# Patient Record
Sex: Female | Born: 1960 | Race: White | Hispanic: No | Smoking: Former smoker
Health system: Southern US, Community
[De-identification: ages and names within clinical notes are randomized; demographics above are authoritative.]

## PROBLEM LIST (undated history)

## (undated) DIAGNOSIS — I2510 Atherosclerotic heart disease of native coronary artery without angina pectoris: Secondary | ICD-10-CM

## (undated) DIAGNOSIS — I219 Acute myocardial infarction, unspecified: Secondary | ICD-10-CM

## (undated) DIAGNOSIS — I200 Unstable angina: Secondary | ICD-10-CM

## (undated) DIAGNOSIS — R0602 Shortness of breath: Secondary | ICD-10-CM

## (undated) DIAGNOSIS — J189 Pneumonia, unspecified organism: Secondary | ICD-10-CM

## (undated) DIAGNOSIS — E8881 Metabolic syndrome: Secondary | ICD-10-CM

## (undated) DIAGNOSIS — I10 Essential (primary) hypertension: Secondary | ICD-10-CM

## (undated) DIAGNOSIS — Z6841 Body Mass Index (BMI) 40.0 and over, adult: Secondary | ICD-10-CM

## (undated) DIAGNOSIS — E785 Hyperlipidemia, unspecified: Secondary | ICD-10-CM

## (undated) HISTORY — PX: ANKLE SURGERY: SHX546

## (undated) HISTORY — PX: CORONARY ANGIOPLASTY: SHX604

## (undated) HISTORY — PX: LEG SURGERY: SHX1003

## (undated) HISTORY — PX: ABDOMINAL HYSTERECTOMY: SHX81

---

## 2002-04-01 ENCOUNTER — Encounter: Payer: Self-pay | Admitting: Family Medicine

## 2002-04-01 ENCOUNTER — Encounter: Admission: RE | Admit: 2002-04-01 | Discharge: 2002-04-01 | Payer: Self-pay | Admitting: Family Medicine

## 2003-02-02 ENCOUNTER — Inpatient Hospital Stay (HOSPITAL_COMMUNITY): Admission: EM | Admit: 2003-02-02 | Discharge: 2003-02-04 | Payer: Self-pay | Admitting: Emergency Medicine

## 2003-02-02 ENCOUNTER — Encounter: Payer: Self-pay | Admitting: Emergency Medicine

## 2004-02-10 ENCOUNTER — Other Ambulatory Visit: Admission: RE | Admit: 2004-02-10 | Discharge: 2004-02-10 | Payer: Self-pay | Admitting: *Deleted

## 2004-02-26 ENCOUNTER — Ambulatory Visit (HOSPITAL_COMMUNITY): Admission: RE | Admit: 2004-02-26 | Discharge: 2004-02-26 | Payer: Self-pay | Admitting: *Deleted

## 2004-05-10 ENCOUNTER — Inpatient Hospital Stay (HOSPITAL_COMMUNITY): Admission: RE | Admit: 2004-05-10 | Discharge: 2004-05-12 | Payer: Self-pay | Admitting: *Deleted

## 2007-02-23 IMAGING — CR DG CHEST 1V PORT
1 series · 1 of 1 positions shown · non-contrast
Comparison: None.

CLINICAL DATA: Chest pain. Shortness of breath. Left arm numbness.
 PORTABLE CHEST - 1 VIEW ([IQ] hours):

[view not recorded]
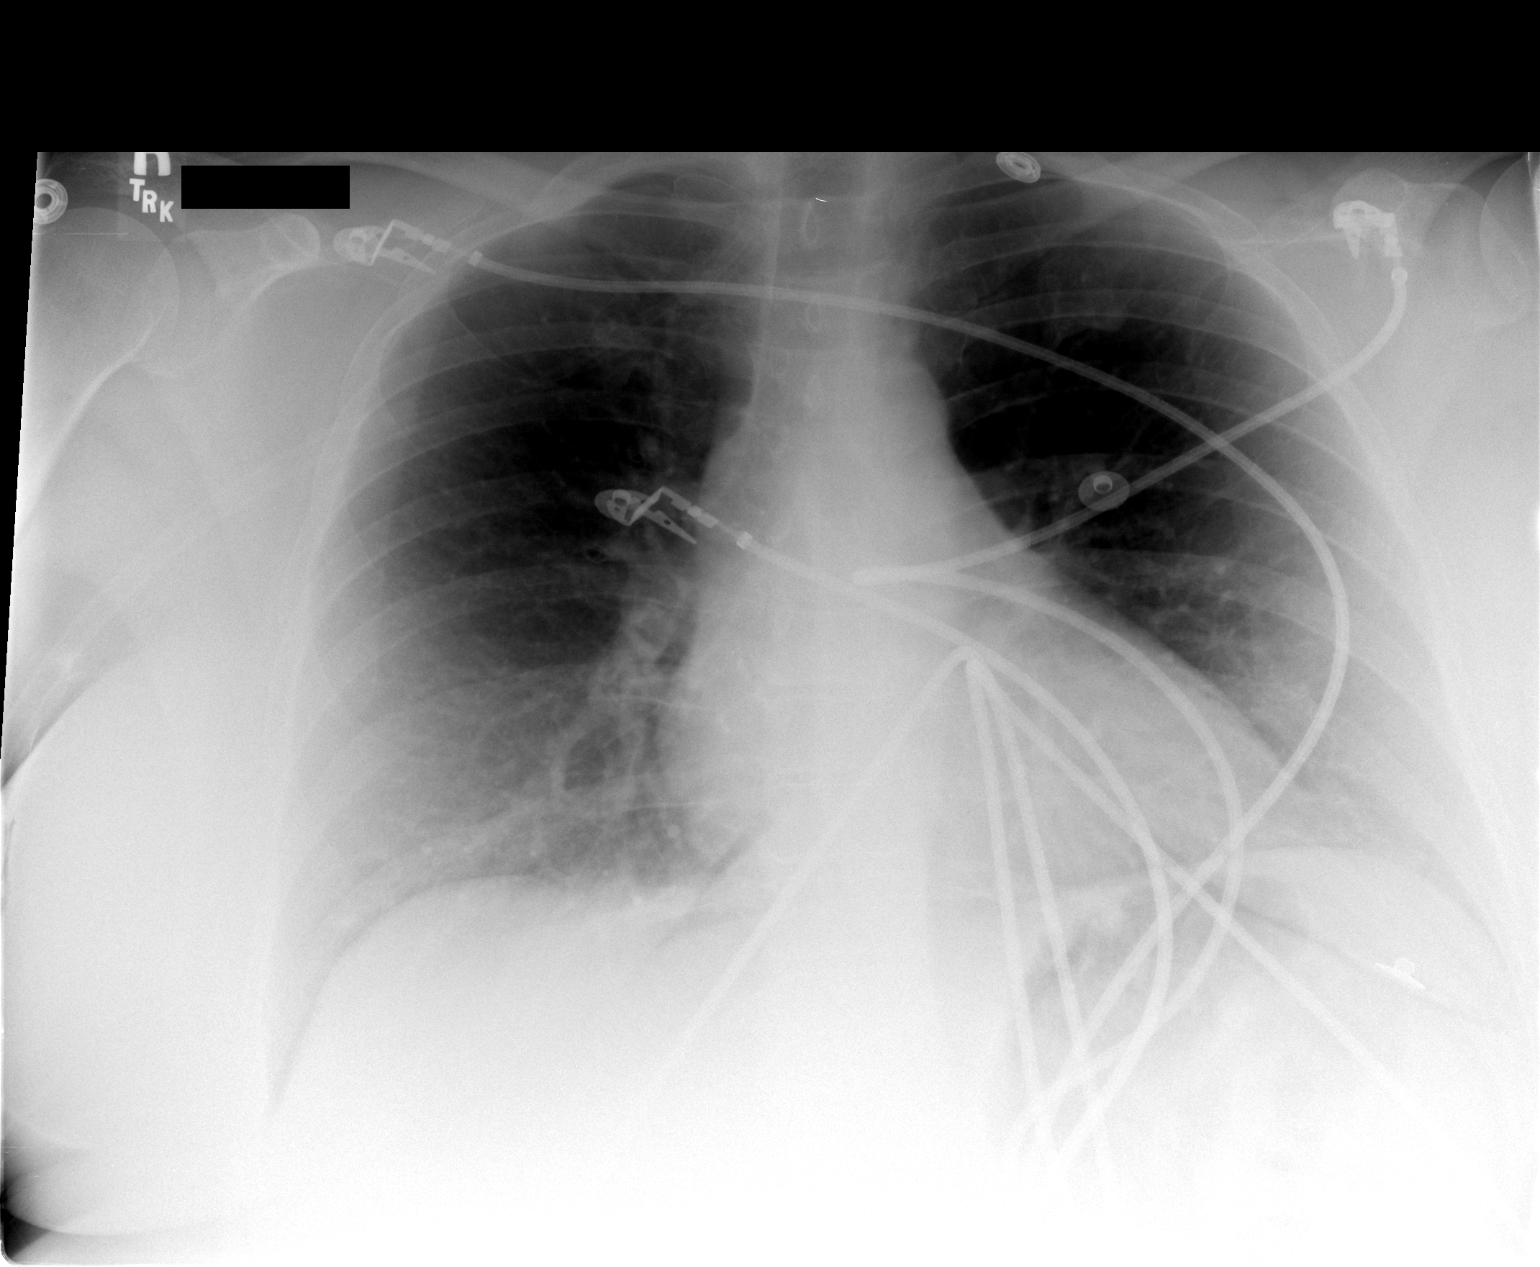

[1 of 1 positions shown; findings below may reference images not displayed]

FINDINGS: The lungs are clear. The heart is within upper limits of normal. No bony abnormality is seen.
IMPRESSION: No active lung disease.

## 2007-03-06 ENCOUNTER — Observation Stay (HOSPITAL_COMMUNITY): Admission: EM | Admit: 2007-03-06 | Discharge: 2007-03-07 | Payer: Self-pay | Admitting: Emergency Medicine

## 2007-04-18 ENCOUNTER — Ambulatory Visit: Payer: Self-pay | Admitting: Orthopedic Surgery

## 2007-04-18 DIAGNOSIS — T84498A Other mechanical complication of other internal orthopedic devices, implants and grafts, initial encounter: Secondary | ICD-10-CM | POA: Insufficient documentation

## 2007-04-26 ENCOUNTER — Telehealth: Payer: Self-pay | Admitting: Orthopedic Surgery

## 2010-07-04 ENCOUNTER — Inpatient Hospital Stay (HOSPITAL_COMMUNITY)
Admission: EM | Admit: 2010-07-04 | Discharge: 2010-07-07 | Disposition: A | Discharge status: Other Acute Inpatient Hospital | Payer: Self-pay | Source: Home / Self Care | Attending: Family Medicine | Admitting: Family Medicine

## 2010-07-04 IMAGING — CR DG CHEST 1V PORT
1 series · 1 of 1 positions shown · non-contrast
Comparison: [DATE]

CLINICAL DATA: Chest pain.  Coronary artery disease.

PORTABLE CHEST - 1 VIEW

[view not recorded]
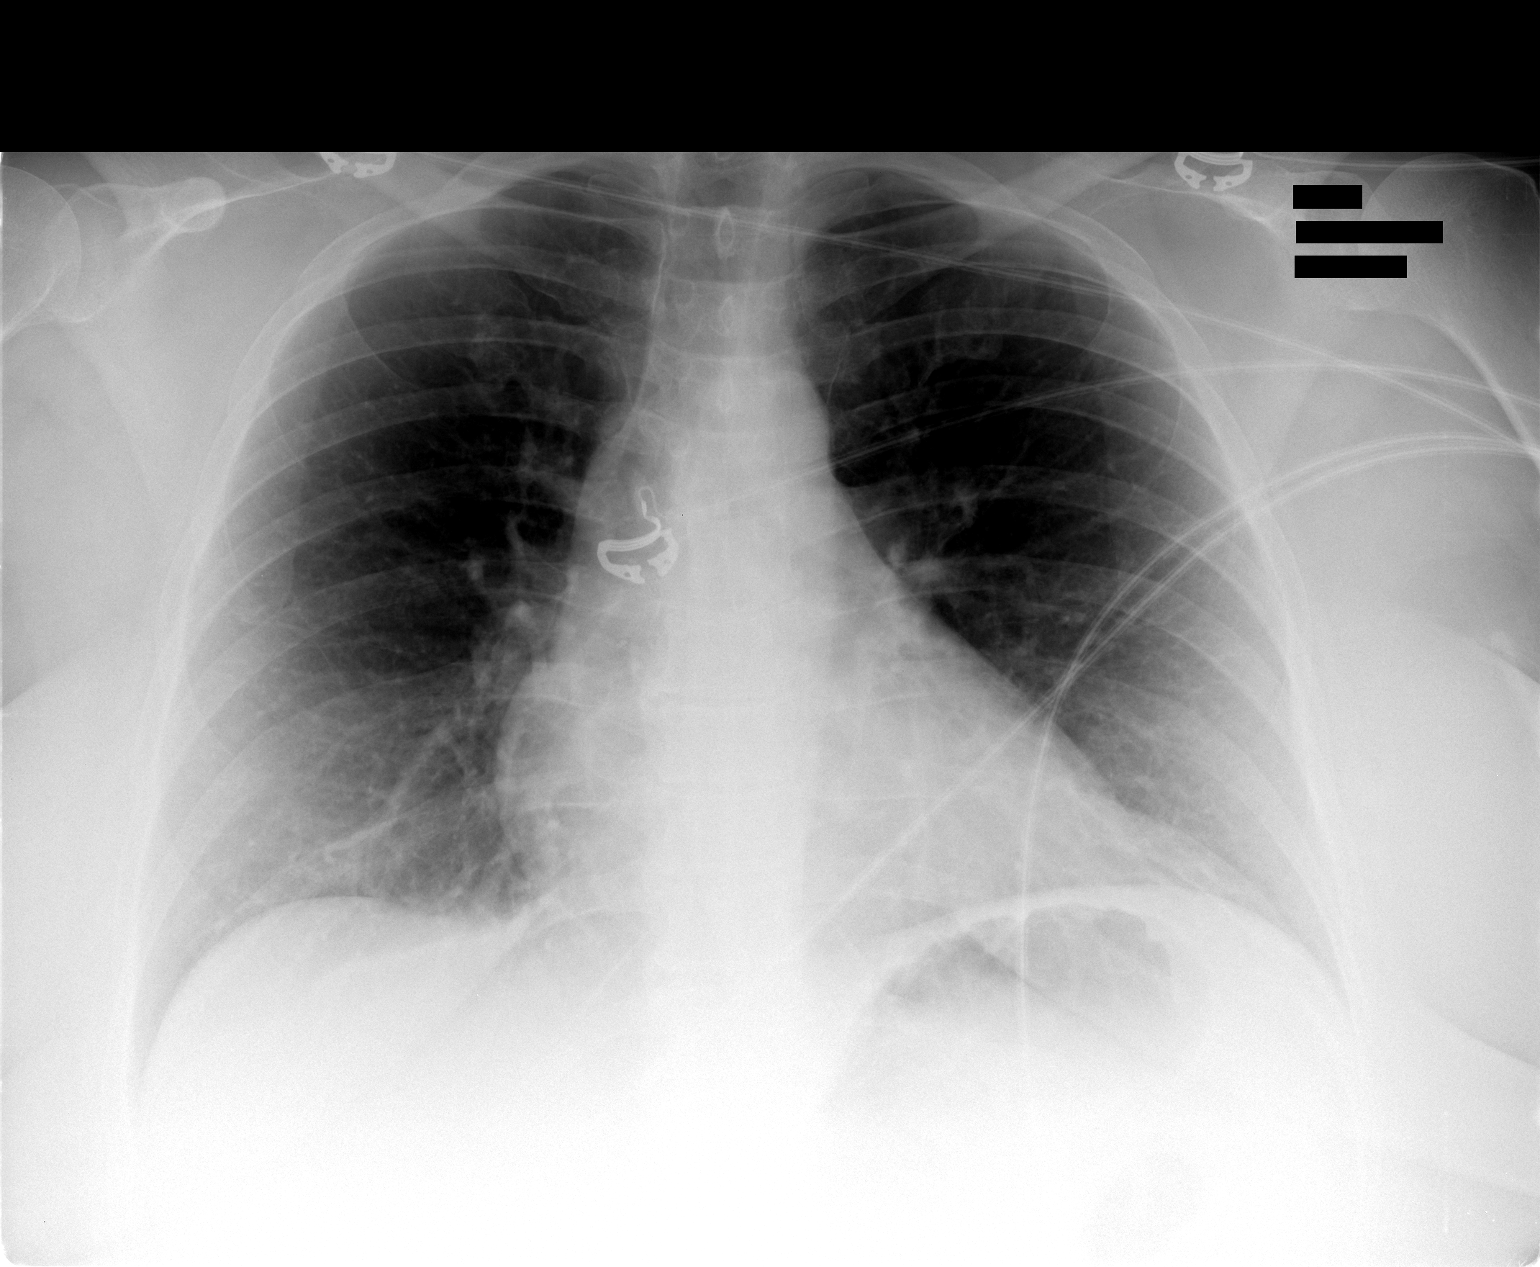

[1 of 1 positions shown; findings below may reference images not displayed]

FINDINGS: Artifact overlies chest.  Heart size is normal.
Mediastinal shadows are normal.  Lungs are clear except for mild
scarring at the bases.  Vascularity is normal.  No effusions.  No
significant bony finding.
IMPRESSION: No active disease.  Basilar scarring

## 2010-07-05 ENCOUNTER — Inpatient Hospital Stay (HOSPITAL_COMMUNITY)
Admission: AD | Admit: 2010-07-05 | Discharge: 2010-07-07 | Payer: Self-pay | Attending: Cardiology | Admitting: Cardiology

## 2010-07-05 LAB — DIFFERENTIAL
Basophils Absolute: 0 10*3/uL (ref 0.0–0.1)
Basophils Relative: 0 % (ref 0–1)
Eosinophils Absolute: 0.1 10*3/uL (ref 0.0–0.7)
Eosinophils Relative: 2 % (ref 0–5)
Lymphocytes Relative: 39 % (ref 12–46)
Lymphs Abs: 3 10*3/uL (ref 0.7–4.0)
Monocytes Absolute: 0.6 10*3/uL (ref 0.1–1.0)
Monocytes Relative: 8 % (ref 3–12)
Neutro Abs: 4 10*3/uL (ref 1.7–7.7)
Neutrophils Relative %: 51 % (ref 43–77)

## 2010-07-05 LAB — CBC
HCT: 39.6 % (ref 36.0–46.0)
Hemoglobin: 13.7 g/dL (ref 12.0–15.0)
MCH: 27.9 pg (ref 26.0–34.0)
MCHC: 34.6 g/dL (ref 30.0–36.0)
MCV: 80.7 fL (ref 78.0–100.0)
Platelets: 223 10*3/uL (ref 150–400)
RBC: 4.91 MIL/uL (ref 3.87–5.11)
RDW: 13.1 % (ref 11.5–15.5)
WBC: 7.8 10*3/uL (ref 4.0–10.5)

## 2010-07-05 LAB — POCT CARDIAC MARKERS
CKMB, poc: 1.2 ng/mL (ref 1.0–8.0)
Myoglobin, poc: 132 ng/mL (ref 12–200)
Troponin i, poc: <0.05 ng/mL (ref 0.00–0.09)

## 2010-07-05 LAB — COMPREHENSIVE METABOLIC PANEL
ALT: 21 U/L (ref 0–35)
AST: 23 U/L (ref 0–37)
Albumin: 3.4 g/dL — ABNORMAL LOW (ref 3.5–5.2)
Calcium: 9.3 mg/dL (ref 8.4–10.5)
Creatinine, Ser: 0.78 mg/dL (ref 0.4–1.2)
GFR calc Af Amer: >60 mL/min (ref >60)
GFR calc non Af Amer: >60 mL/min (ref >60)
Sodium: 139 mEq/L (ref 135–145)
Total Protein: 6.8 g/dL (ref 6.0–8.3)

## 2010-07-06 LAB — BASIC METABOLIC PANEL
BUN: 6 mg/dL (ref 6–23)
CO2: 23 mEq/L (ref 19–32)
Calcium: 9.1 mg/dL (ref 8.4–10.5)
Chloride: 105 mEq/L (ref 96–112)
Creatinine, Ser: 0.76 mg/dL (ref 0.4–1.2)
GFR calc non Af Amer: >60 mL/min (ref >60)
Potassium: 4.7 mEq/L (ref 3.5–5.1)
Sodium: 143 mEq/L (ref 135–145)

## 2010-07-06 LAB — LIPID PANEL
Cholesterol: 186 mg/dL (ref 0–200)
Total CHOL/HDL Ratio: 6.6 RATIO
Triglycerides: 159 mg/dL — ABNORMAL HIGH (ref <150)
VLDL: 32 mg/dL (ref 0–40)
VLDL: 31 mg/dL (ref 0–40)

## 2010-07-06 LAB — CARDIAC PANEL(CRET KIN+CKTOT+MB+TROPI)
CK, MB: 3.7 ng/mL (ref 0.3–4.0)
CK, MB: 2.9 ng/mL (ref 0.3–4.0)
CK, MB: 1.5 ng/mL (ref 0.3–4.0)
Relative Index: INVALID (ref 0.0–2.5)
Relative Index: INVALID (ref 0.0–2.5)
Total CK: 77 U/L (ref 7–177)
Total CK: 79 U/L (ref 7–177)
Troponin I: 0.13 ng/mL — ABNORMAL HIGH (ref 0.00–0.06)
Troponin I: 0.07 ng/mL — ABNORMAL HIGH (ref 0.00–0.06)

## 2010-07-06 LAB — CBC
HCT: 38.1 % (ref 36.0–46.0)
MCHC: 32.3 g/dL (ref 30.0–36.0)
Platelets: 250 10*3/uL (ref 150–400)
RDW: 13.6 % (ref 11.5–15.5)

## 2010-07-06 LAB — PROTIME-INR: INR: 1.04 (ref 0.00–1.49)

## 2010-07-07 LAB — CBC
HCT: 37 % (ref 36.0–46.0)
Hemoglobin: 12.3 g/dL (ref 12.0–15.0)
RBC: 4.58 MIL/uL (ref 3.87–5.11)
WBC: 7.4 10*3/uL (ref 4.0–10.5)

## 2010-07-07 LAB — BASIC METABOLIC PANEL
CO2: 27 mEq/L (ref 19–32)
Chloride: 109 mEq/L (ref 96–112)
GFR calc non Af Amer: >60 mL/min (ref >60)
Glucose, Bld: 94 mg/dL (ref 70–99)
Potassium: 4 mEq/L (ref 3.5–5.1)
Sodium: 141 mEq/L (ref 135–145)

## 2010-07-08 NOTE — H&P (Signed)
[name], Carson               ACCOUNT NO.:  [id number]  MEDICAL RECORD NO.:  [id number]          PATIENT TYPE:  INP  LOCATION:  A210                          FACILITY:  [facility]  PHYSICIAN:  Mila Homer. Sudie Bailey, M.D.DATE OF BIRTH:  [birth year]  DATE OF ADMISSION:  07/04/2010 DATE OF DISCHARGE:  LH                             HISTORY & PHYSICAL   This 50 year old woman was at home last evening when she developed severe chest heaviness and pressure with tremendous weakness of both arms.  She could not find nitroglycerin at home, and was on her way to a friend's to get some nitroglycerin when the pain and symptoms got worse so she stopped at a local fire station where EMS met her, gave her nitroglycerin.  By the time she had an  IV placed was on her way to [facility] emergency room.  The pain gradually let up, so that by the time she reached emergency room the pain had cleared.  The patient has a long and complicated history.  She had unstable angina in 2004, and diagnostic catheterization in August of that year showed 80% mid LAD narrowing.  She had a cutting balloon intervention with good results.  In September 2008 she developed chest pain while at work, and was admitted to [facility] for another cardiac cath.  At that time she had a normal left anterior descending artery, normal left main, and a small left circumflex, and a very large obtuse marginal artery which had a 50% blockage.  She was put on Plavix and aspirin at that time.  She continues on aspirin 81 mg daily.  She has, otherwise, done well.  She is married with 2 children.  She works for an [employer] in [place].  About 3 years ago her 72 year old brother died suddenly of an MI.  There are a number of other family members who had coronary disease.  On admission through the emergency room she had a temperature of 98.3, blood pressure 130/73, pulse 68, respiratory rate 18. My exam roughly 12  hours after initial episode she had stayed pain free, and was in no acute distress.  Speech was normal, sentence structure intact. Mucous membranes are moist. SKIN:  Turgor normal. HEART:  Had an absolute regular rhythm, rate of about 70. LUNGS:  Were clear throughout, moving air well. ABDOMEN:  Soft without organomegaly or mass or tenderness.  She had no edema of the ankles. There is no axillary or supraclavicular adenopathy.  Review of her blood test showed an admission, normal CBC and CMP except for potassium 3.4 and a glucose of 120.  Her albumin is 3.4.  Cardiac markers on admission showed a CK-MB of 1.2, troponin less than 0.05, myoglobin 132, but then about 7 hours later her troponin was up to 0.29. CK-MB at that time was 3.7 with a CK of 77.  OBJECTIVE:  Her EKG was essentially normal.  There were 1 mm ST elevations noted in a number of leads.  ADMISSION DIAGNOSES: 1. Acute coronary syndrome. 2. Family history of coronary artery disease. 3. Obesity. 4. Personal history of CAD, status post cutting  balloon angioplasty of an LAD lesion  Given the elevation of troponin, I have asked her cardiologist to see her in consult.  Discussed all this with patient and her husband at length this morning.     Mila Homer. Sudie Bailey, M.D.     SDK/MEDQ  D:  07/05/2010  T:  07/05/2010  Job:  [id number]  Electronically Signed by John Giovanni M.D. on 07/06/2010 05:20:02 AM

## 2010-07-15 NOTE — Discharge Summary (Addendum)
[name], [name]               ACCOUNT NO.:  [id number]  MEDICAL RECORD NO.:  [id number]          PATIENT TYPE:  INP  LOCATION:  6529                         FACILITY:  [facility]  PHYSICIAN:  Ritta Slot, MD     DATE OF BIRTH:  14-Oct-1960  DATE OF ADMISSION:  07/05/2010 DATE OF DISCHARGE:  07/07/2010                              DISCHARGE SUMMARY   DISCHARGE DIAGNOSES: 1. Acute coronary syndrome/unstable angina with peak troponin 0.29. 2. Known coronary disease with previous left anterior descending     atherectomy in 2004 and now 90% circumflex as culprit lesion,     undergoing percutaneous transluminal coronary angioplasty and stent     with a drug-eluting Promus stent to the circumflex. 3. Normal left ventricular function with ejection fraction 65%. 4. Family history of coronary disease. 5. Hyperkalemia, resolved after Kayexalate. 6. Dyslipidemia, treated now.  DISCHARGE CONDITION:  Improved.  PROCEDURES:  Combined left heart cath with radial cath by Dr. Ritta Slot on July 06, 2010, and percutaneous transluminal coronary angioplasty and stent deployment by Dr. Nanetta Batty to the left circumflex with a Promus drug-eluting stent again through the radial artery.  DISCHARGE INSTRUCTIONS: 1. May return to work on July 11, 2010. 2. Increase activity slowly.  May shower.  No lifting for 3 days.  No     driving for 3 days. 3. Low-sodium heart-healthy diet. 4. Wash cath site with soap and water.  Follow radial cath     instructions. 5. Follow up with Dr. Lynnea Ferrier on July 18, 2010, at 4:15 p.m.  DISCHARGE MEDICATIONS:  See medication reconciliation sheet.  She has been placed on new medications including statins, beta-blocker, Effient, and aspirin.  HOSPITAL COURSE:  [name] was seen by Dr. Lynnea Ferrier in consult at [facility].  Dr. Lynnea Ferrier saw her and felt she needed cardiac catheterization.  She was transferred to [facility] due to her  known coronary disease with atherectomy of the mid LAD lesion in August 2004 and a repeat cath in September 2008 with normal LAD but a 50% mid OM lesion of the circumflex.  EF was 60%, underwent cardiac cath with the radial artery and tolerated the procedure well and was found to have 90% circumflex stenosis and stent was placed.  On July 07, 2010, she is stable, ambulates without problems, and ready for discharge home.  LABORATORY DATA:  At discharge, sodium 141, potassium 4, chloride 109, CO2 is 27, glucose 94, BUN 5, creatinine 0.83, calcium 9.0.  Hemoglobin 12.3, hematocrit 37, WBC 7.4, platelets 200.  Cardiac enzymes; CK-MBs were all negative at 75 with MB of 1.5, troponin I peak was 0.29. Total cholesterol was 187, triglycerides 159, HDL 30, and LDL 125. LFTs; total bili 0.3, alkaline phos 80, SGOT 23, SGPT 21, total protein 6.8, calcium 9.3.  RADIOLOGY:  Chest x-ray, no active disease, basilar scarring. Echo, no echoes were done. EKGs postprocedure, sinus rhythm, normal EKG, and pre procedure on July 05, 2010, sinus rhythm, no significant changes.  Again, she will follow up as an outpatient.     Darcella Gasman. Annie Paras, N.P.   ______________________________ Ritta Slot, MD  LRI/MEDQ  D:  07/07/2010  T:  07/08/2010  Job:  [id number]  cc:   Mila Homer. Sudie Bailey, M.D.  Electronically Signed by Nada Boozer N.P. on 07/08/2010 07:47:00 PM Electronically Signed by Ritta Slot MD on 07/15/2010 04:45:41 PM

## 2010-07-15 NOTE — Procedures (Addendum)
  NAMEKEORA, [name]               ACCOUNT NO.:  [id number]  MEDICAL RECORD NO.:  [id number]           PATIENT TYPE:  LOCATION:                                 FACILITY:  PHYSICIAN:  Ritta Slot, MD     DATE OF BIRTH:  [birth year]  DATE OF PROCEDURE:  07/06/2010 DATE OF DISCHARGE:                           CARDIAC CATHETERIZATION   PROCEDURE:  Left heart catheterization.  INDICATIONS:  Ms. Chanique Duca is a very pleasant 50 year old Caucasian female who has known coronary artery disease with a 50% mid-circ lesion from the cath in 2008 done by myself.  She now comes in with an STEMI with a peak troponin of 0.29 yesterday, so she was transferred from [facility] down to [facility] to have a cardiac catheterization.  After informed consent, her right wrist was prepped and draped in the usual sterile fashion.  She received 10 mg of Versed, 50 mcg of fentanyl for IV sedation.  Using the modified Seldinger technique, a 5-French sheath was placed in the right radial artery without difficulty and using the regular cath lab cocktail.  A 5-French Tiger catheter was then used to engage the left main coronary artery. Visualization of the left circulation system was obtained in AP cranial, AP caudal, LAO caudal, and RAO cranial views.  The Tiger catheter was then used to engage the right coronary artery without difficulty. Visualization was obtained in RAO cranial and LAO cranial views.  The Tiger catheter was then changed over an exchange wire for a straight pigtail.  The straight pigtail was placed into the LV without difficulty.  Visualization of the LV cavity was obtained in the RAO oblique views, and 30 mL of contrast with 12 mL per second.  FINDINGS:  AO pressure 109/71, mean of 85; LV pressure 119/3;  LVEDP of 14.  LV pullback 130/4, LVEDP of 19.  AO pullback 107/65, mean of 84. The right coronary artery had a small 30% mid vessel stenosis, but no significant  disease.  It was dominant giving rise to PL and PDA.  The left main bifurcated in the LAD and the circumflex territory.  The LAD territory was free of any disease.  The circumflex had a very rather long diffuse 80% narrowing in its proximal portion prior to the takeoff of the OM-1.  There was also a high left atrial branch that was arising from the circumflex artery.  It was found to be normal.  No significant wall motion abnormality.  No mitral regurgitation.  There is no gradient across the aortic valve.  FINAL CONCLUSION:  A 90% stenosis in the proximal circumflex artery. The patient will require PCI to be performed at that time by Dr. Allyson Sabal.     Ritta Slot, MD     HS/MEDQ  D:  07/06/2010  T:  07/07/2010  Job:  [id number]  Electronically Signed by Ritta Slot MD on 07/15/2010 04:45:43 PM

## 2010-09-29 ENCOUNTER — Emergency Department (HOSPITAL_COMMUNITY)
Admission: EM | Admit: 2010-09-29 | Discharge: 2010-09-29 | Disposition: A | Discharge status: Home / Self Care | Payer: BC Managed Care – PPO | Attending: Emergency Medicine | Admitting: Emergency Medicine

## 2010-09-29 ENCOUNTER — Emergency Department (HOSPITAL_COMMUNITY): Payer: BC Managed Care – PPO

## 2010-09-29 DIAGNOSIS — Z7902 Long term (current) use of antithrombotics/antiplatelets: Secondary | ICD-10-CM | POA: Insufficient documentation

## 2010-09-29 DIAGNOSIS — R109 Unspecified abdominal pain: Secondary | ICD-10-CM | POA: Insufficient documentation

## 2010-09-29 DIAGNOSIS — I2510 Atherosclerotic heart disease of native coronary artery without angina pectoris: Secondary | ICD-10-CM | POA: Insufficient documentation

## 2010-09-29 DIAGNOSIS — Z79899 Other long term (current) drug therapy: Secondary | ICD-10-CM | POA: Insufficient documentation

## 2010-09-29 DIAGNOSIS — Z7982 Long term (current) use of aspirin: Secondary | ICD-10-CM | POA: Insufficient documentation

## 2010-09-29 LAB — COMPREHENSIVE METABOLIC PANEL
ALT: 20 U/L (ref 0–35)
Alkaline Phosphatase: 88 U/L (ref 39–117)
CO2: 24 mEq/L (ref 19–32)
Calcium: 9.6 mg/dL (ref 8.4–10.5)
GFR calc non Af Amer: >60 mL/min (ref >60)
Glucose, Bld: 96 mg/dL (ref 70–99)
Sodium: 138 mEq/L (ref 135–145)
Total Bilirubin: 0.4 mg/dL (ref 0.3–1.2)

## 2010-09-29 LAB — URINALYSIS, ROUTINE W REFLEX MICROSCOPIC
Bilirubin Urine: NEGATIVE
Ketones, ur: NEGATIVE mg/dL
Nitrite: NEGATIVE
Protein, ur: NEGATIVE mg/dL
Specific Gravity, Urine: 1.01 (ref 1.005–1.030)
Urobilinogen, UA: 0.2 mg/dL (ref 0.0–1.0)

## 2010-09-29 LAB — DIFFERENTIAL
Basophils Absolute: 0 10*3/uL (ref 0.0–0.1)
Lymphocytes Relative: 33 % (ref 12–46)
Monocytes Absolute: 1 10*3/uL (ref 0.1–1.0)
Monocytes Relative: 10 % (ref 3–12)
Neutro Abs: 5.4 10*3/uL (ref 1.7–7.7)

## 2010-09-29 LAB — CBC
HCT: 40.8 % (ref 36.0–46.0)
Hemoglobin: 13.6 g/dL (ref 12.0–15.0)
MCHC: 33.3 g/dL (ref 30.0–36.0)

## 2010-09-29 LAB — LIPASE, BLOOD: Lipase: 30 U/L (ref 11–59)

## 2010-09-29 IMAGING — US US ABDOMEN COMPLETE
1 series · 14 of 25 positions shown · non-contrast
Comparison: None

CLINICAL DATA: Abdominal pain

ULTRASOUND ABDOMEN:
TECHNIQUE: Sonography of upper abdominal structures was performed.

[Series 1: us abdomen complete · 0.25mm/px · 14 of 85 slices shown]
[im 1/85]
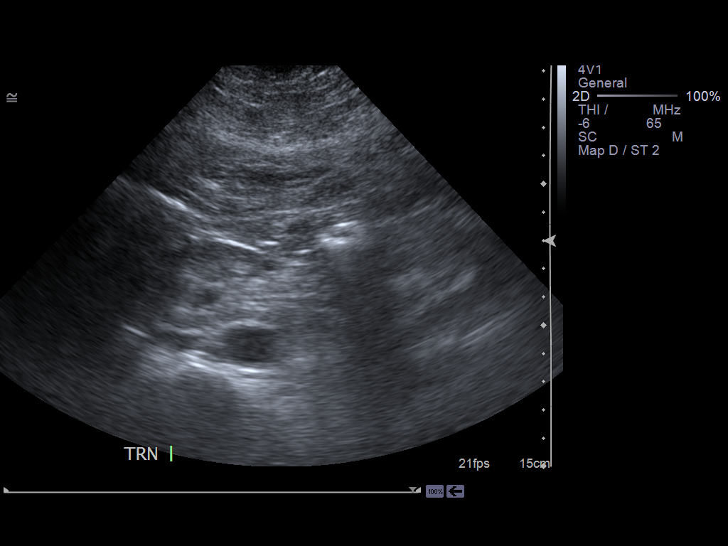
[im 8/85]
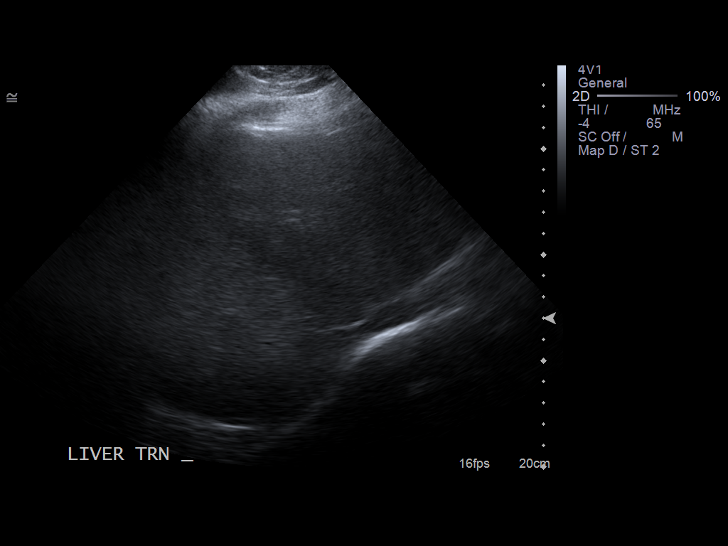
[im 15/85]
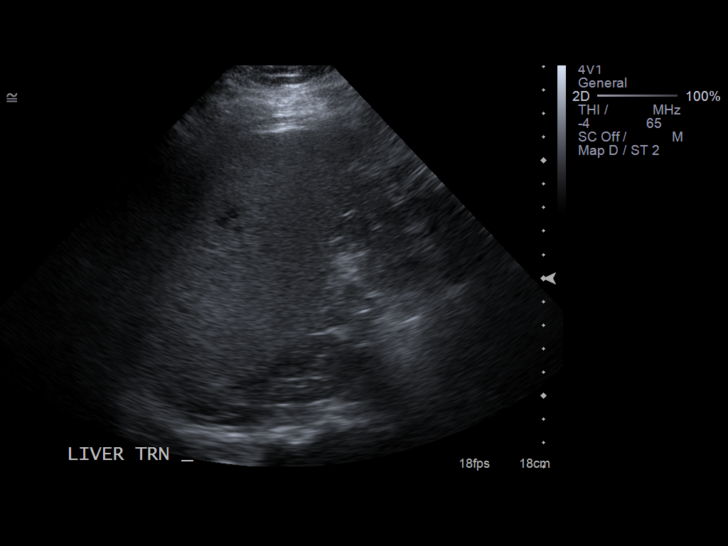
[im 22/85]
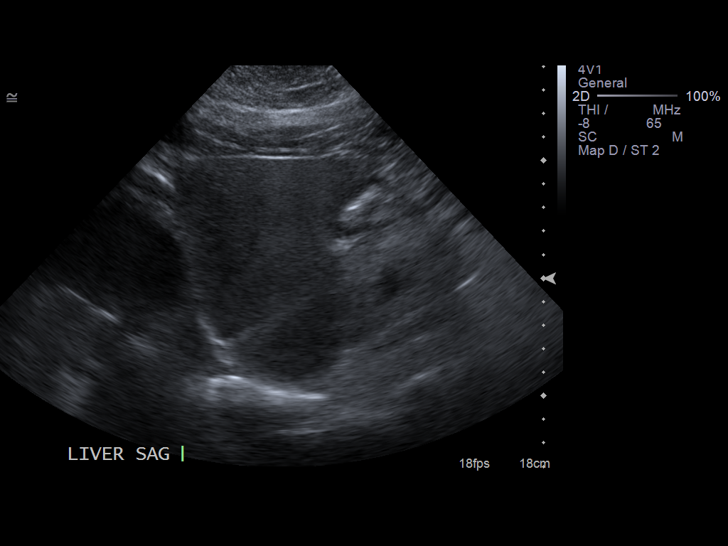
[im 29/85]
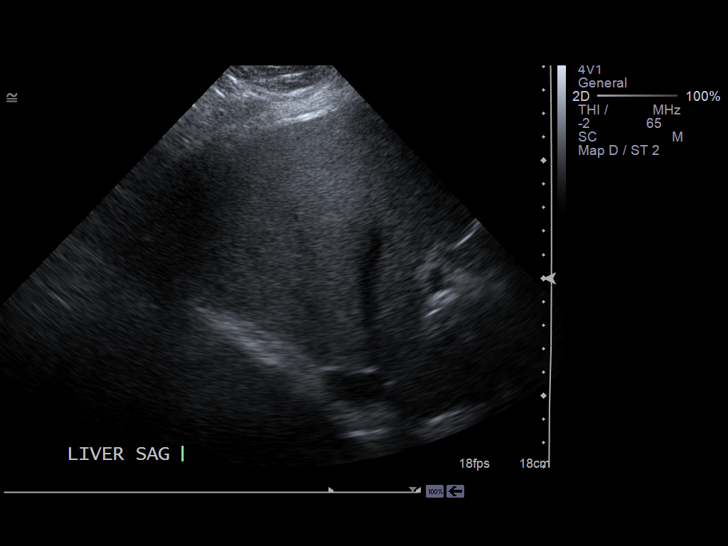
[im 32/85]
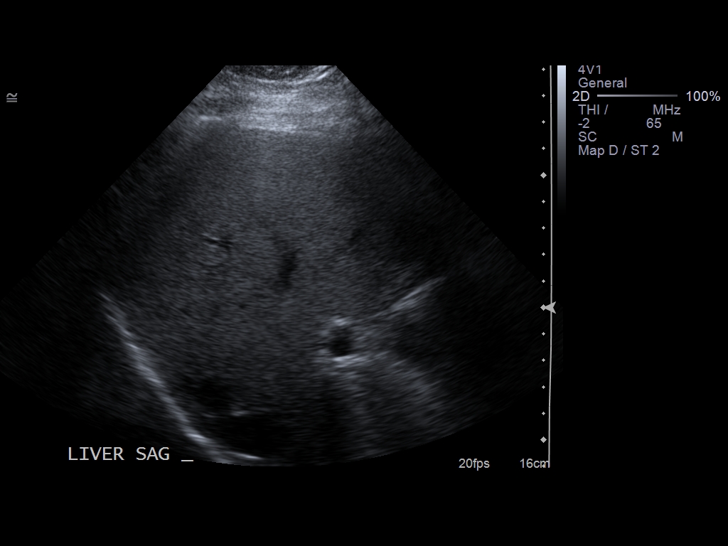
[im 39/85]
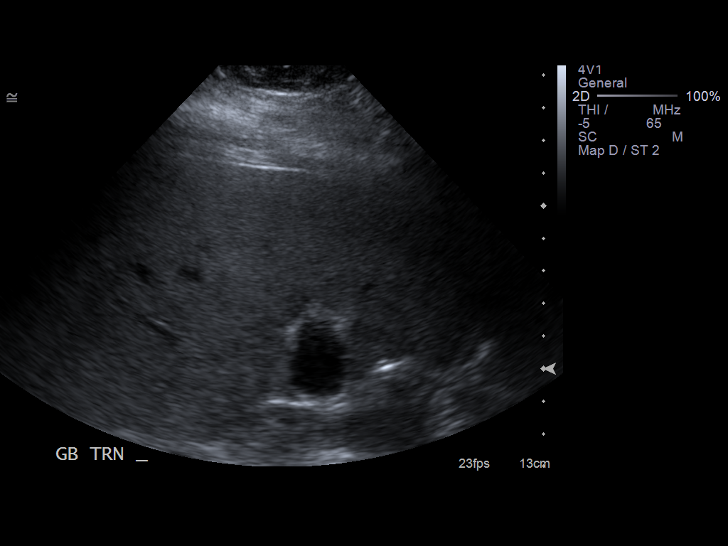
[im 46/85]
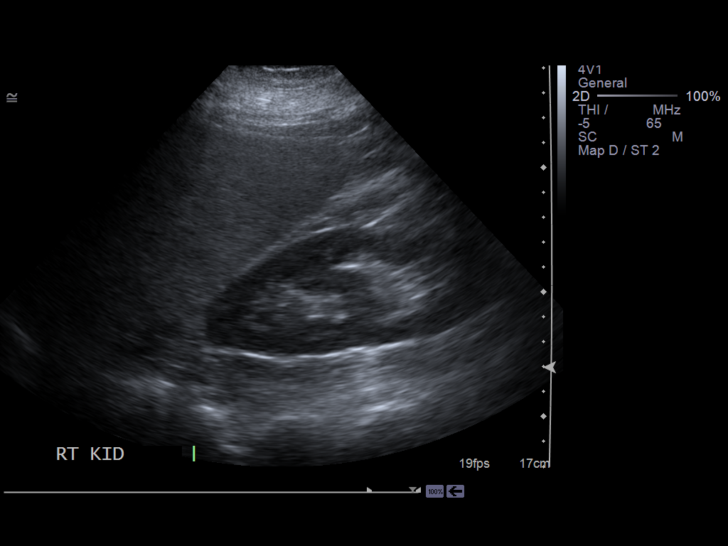
[im 53/85]
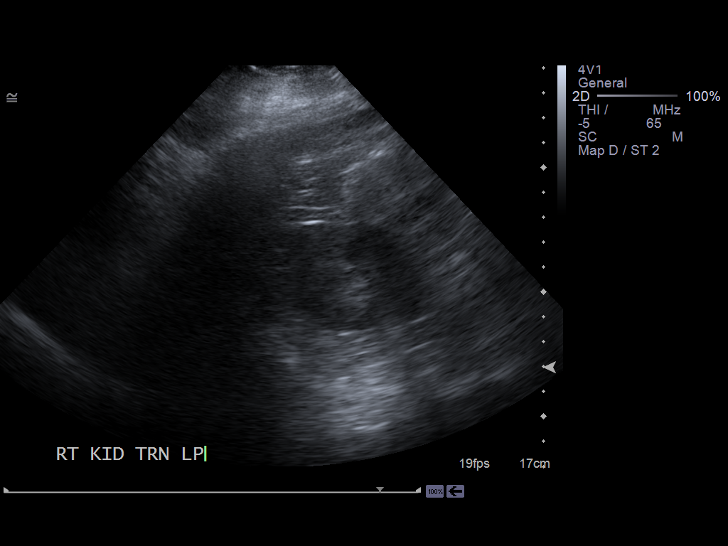
[im 57/85]
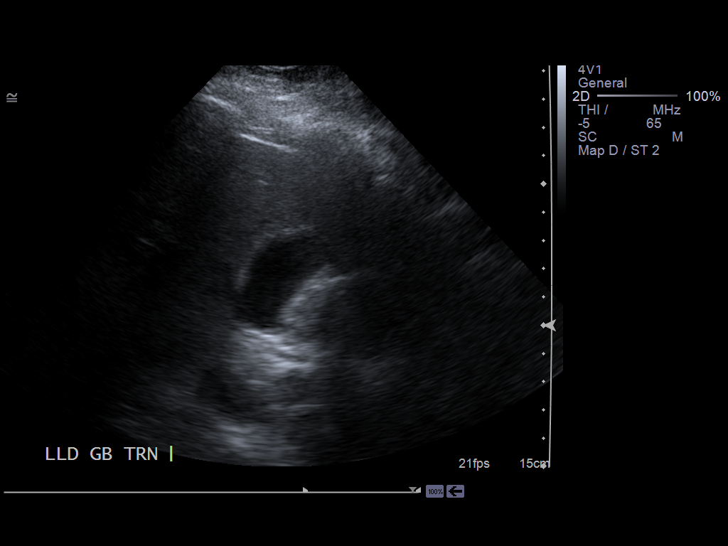
[im 64/85]
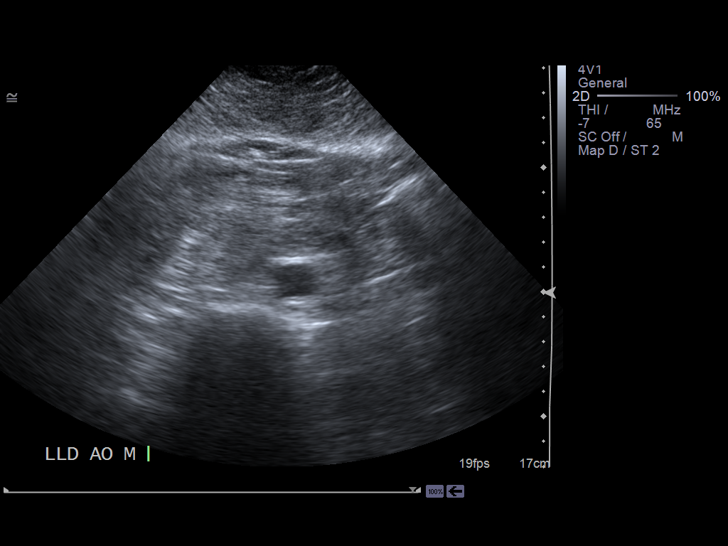
[im 71/85]
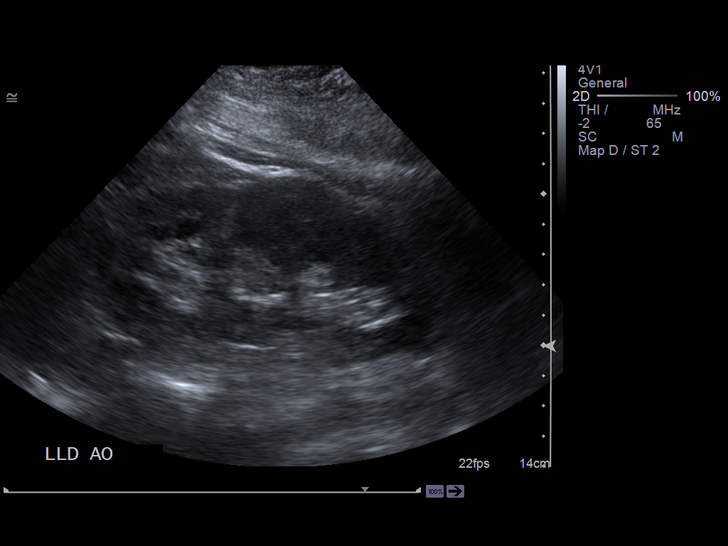
[im 78/85]
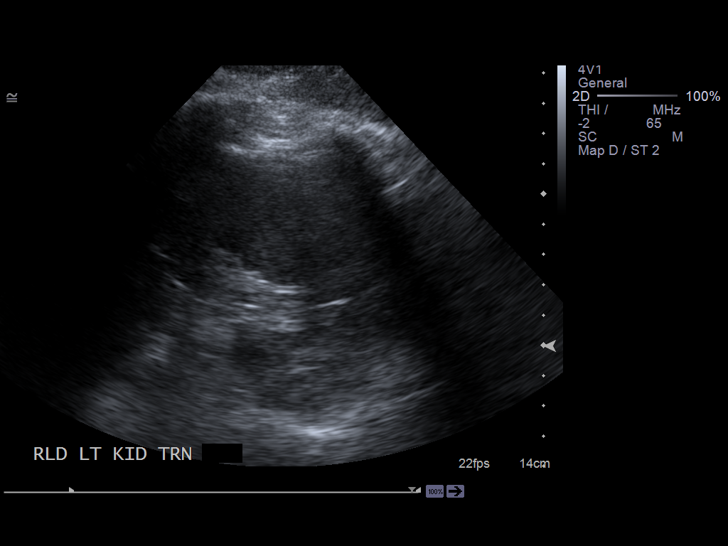
[im 85/85]
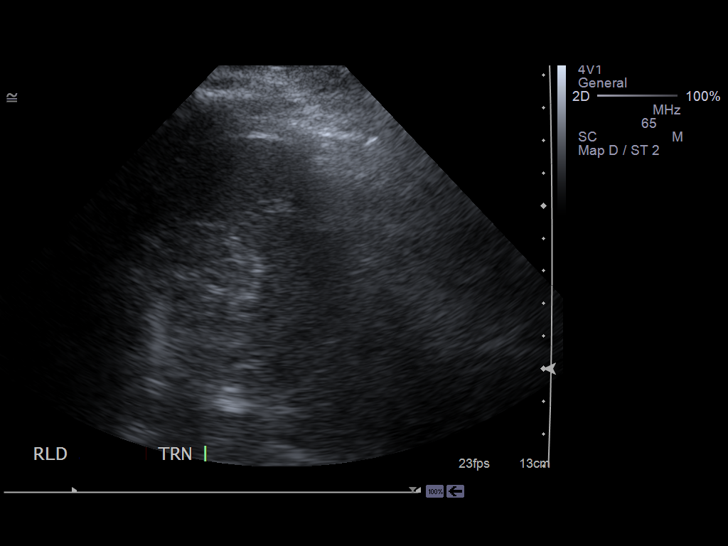

[14 of 25 positions shown; findings below may reference images not displayed]

Gallbladder:  Normally distended without stones or wall thickening.
No pericholecystic fluid or sonographic Murphy sign.

Common bile duct:  Normal caliber 3 mm diameter

Liver:  Echogenic, likely fatty infiltration, though this can be
seen with cirrhosis and certain infiltrative disorders.  No focal
hepatic mass or nodularity identified, though intrapelvic detail is
limited by poor sound transmission.  Hepatopetal portal venous
flow.

IVC:  Normal appearance

Pancreas:  Inadequately evaluated due to shadowing from bowel gas.

Spleen:  Normal appearance, 8.1 cm length.

Right kidney:  10.7 cm length. Normal morphology without mass or
hydronephrosis.

Left kidney:  11.9 cm length. Normal morphology without mass or
hydronephrosis.

Aorta:  Normal appearance

Other:  No free fluid
IMPRESSION: Inadequate visualization of the pancreas due to obscuration by
bowel gas.
Probable fatty infiltration of liver.
Otherwise normal exam.

## 2010-09-30 ENCOUNTER — Ambulatory Visit (HOSPITAL_COMMUNITY)
Admission: RE | Admit: 2010-09-30 | Discharge: 2010-09-30 | Disposition: A | Discharge status: Home / Self Care | Payer: BC Managed Care – PPO | Source: Ambulatory Visit | Attending: Family Medicine | Admitting: Family Medicine

## 2010-09-30 ENCOUNTER — Encounter (HOSPITAL_COMMUNITY): Payer: Self-pay

## 2010-09-30 ENCOUNTER — Other Ambulatory Visit (HOSPITAL_COMMUNITY): Payer: Self-pay | Admitting: Family Medicine

## 2010-09-30 DIAGNOSIS — R1011 Right upper quadrant pain: Secondary | ICD-10-CM

## 2010-09-30 DIAGNOSIS — R1031 Right lower quadrant pain: Secondary | ICD-10-CM | POA: Insufficient documentation

## 2010-09-30 DIAGNOSIS — R10811 Right upper quadrant abdominal tenderness: Secondary | ICD-10-CM

## 2010-09-30 HISTORY — DX: Essential (primary) hypertension: I10

## 2010-09-30 IMAGING — CT CT ABDOMEN W/ CM
2 of 5 series · 15 of 46 positions shown, 17 images · IV contrast (omnipaque)
Comparison: None

CLINICAL DATA: Right upper quadrant pain and tenderness, right
flank pain, poor appetite, history coronary artery disease and I

CT ABDOMEN WITH CONTRAST
TECHNIQUE: Multidetector CT imaging of the abdomen was performed
using the standard protocol following bolus administration of
intravenous contrast.  Sagittal and coronal MPR images
reconstructed from axial data set.
Contrast: Dilute oral contrast and 100 ml Omnipaque 300 IV

[Series 9: abd_pel_with 3.0 spo cor · coronal · 0.60mm/px · 3 of 110 slices shown]
[im 37/110  soft-tissue]
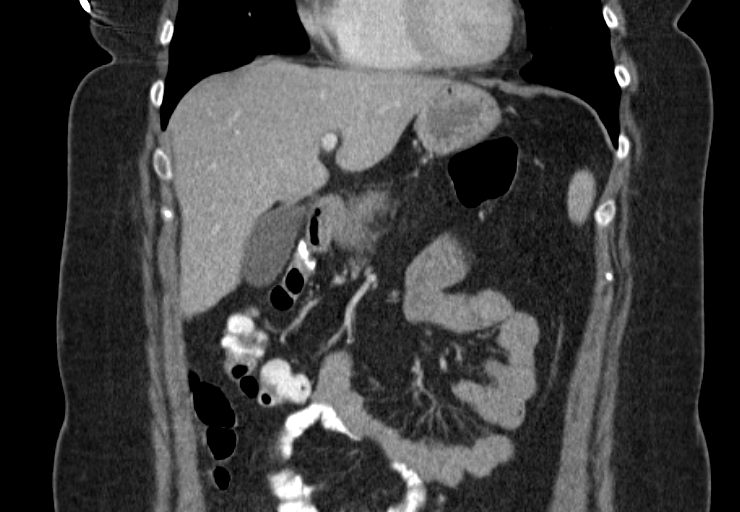
[im 49/110  soft-tissue]
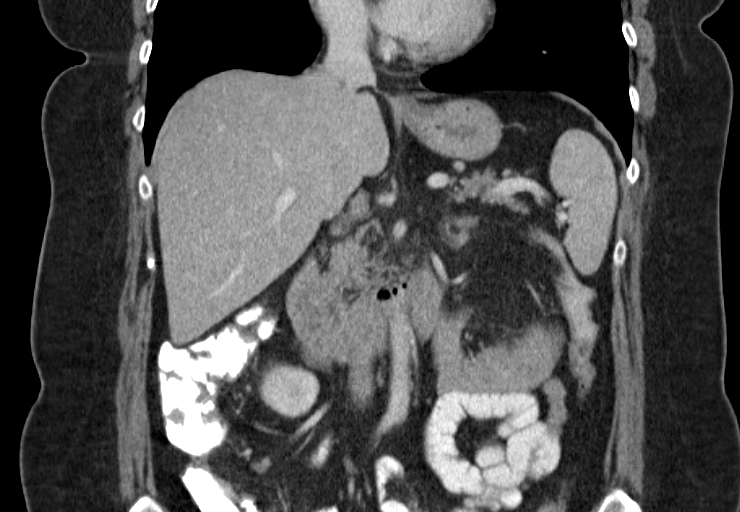
[im 61/110  soft-tissue]
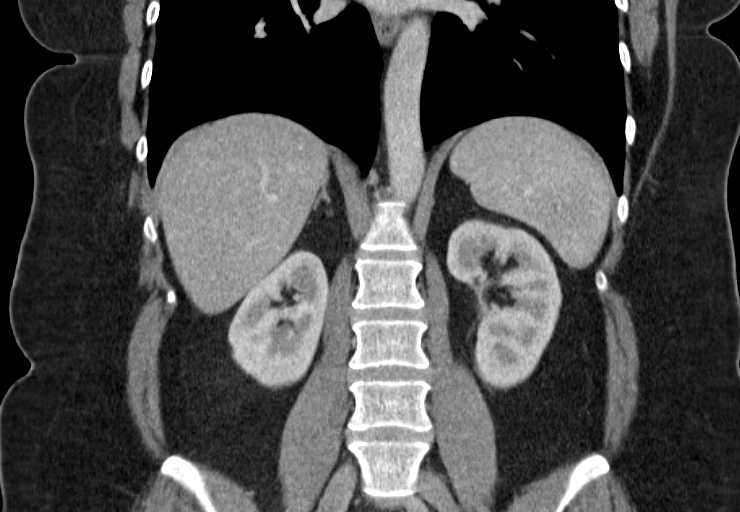

[Series 11: abd_pel_with 5.0 b40f · axial · 0.89mm/px · z∈[-256,+4]mm · 12 of 62 slices shown, 14 images]
[im 5/62  soft-tissue]
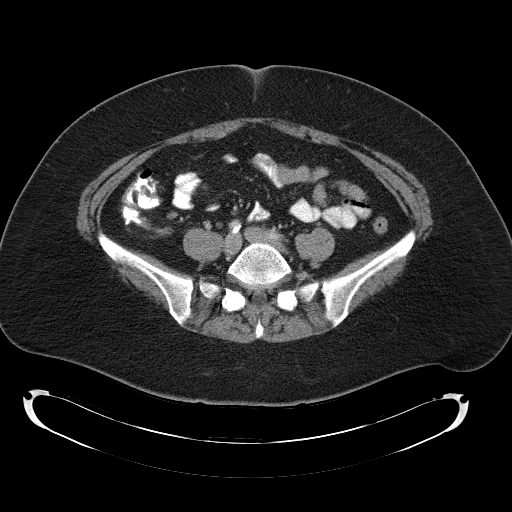
[im 5/62  bone]
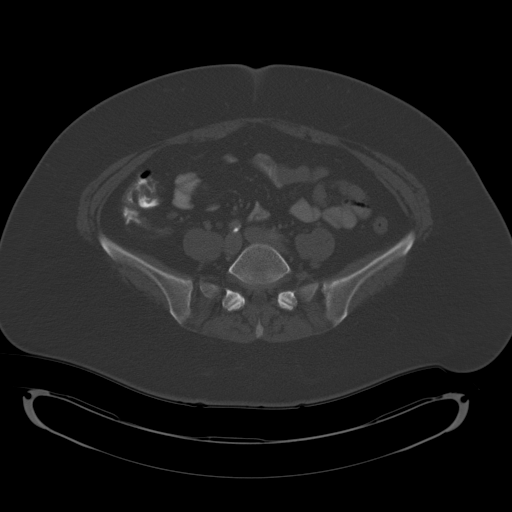
[im 10/62  soft-tissue]
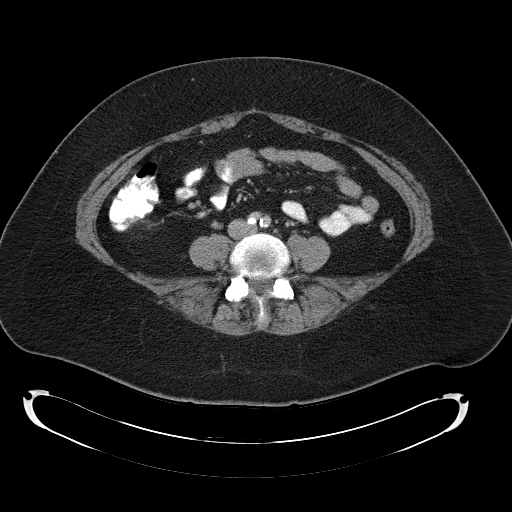
[im 15/62  soft-tissue]
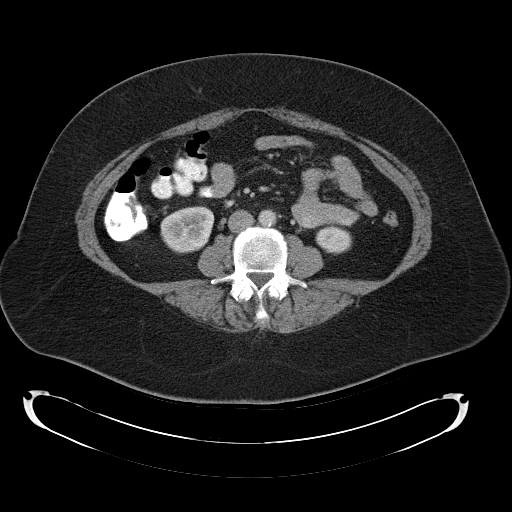
[im 19/62  soft-tissue]
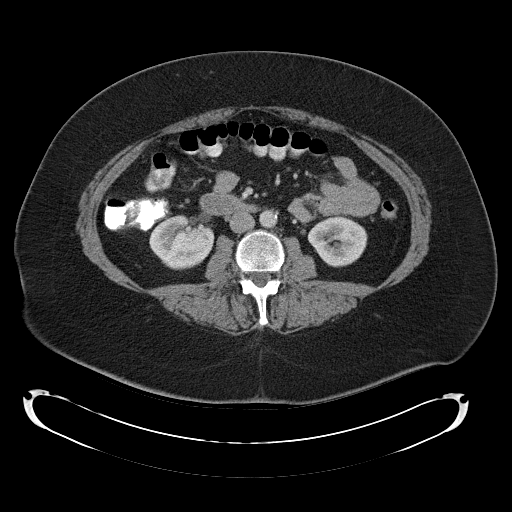
[im 24/62  soft-tissue]
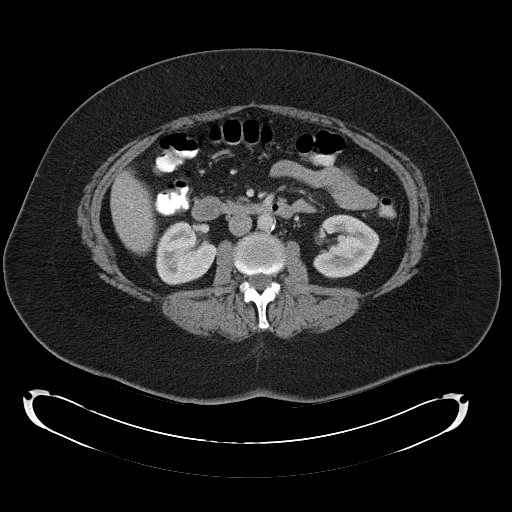
[im 29/62  soft-tissue]
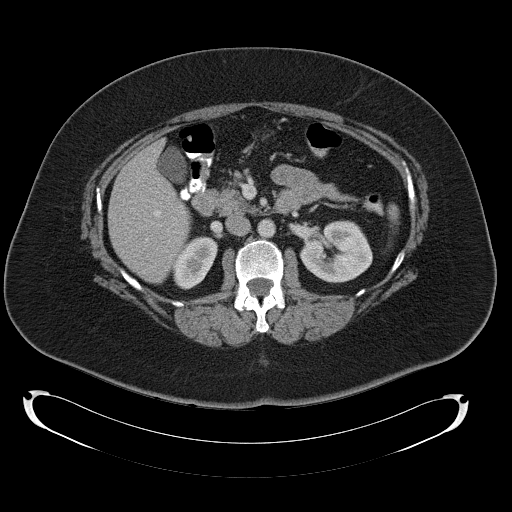
[im 33/62  soft-tissue]
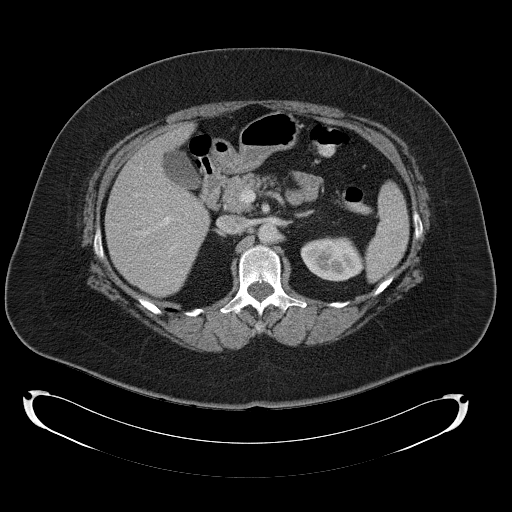
[im 38/62  soft-tissue]
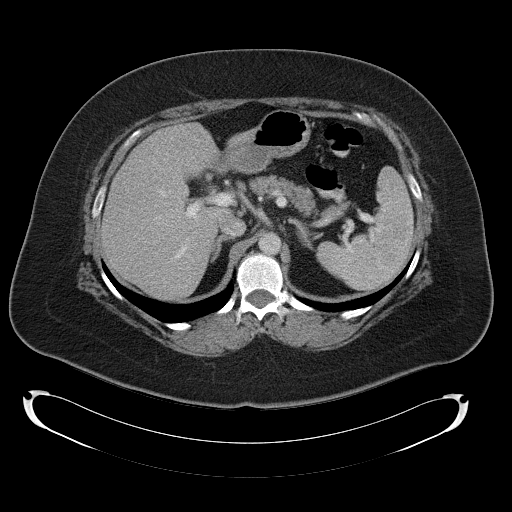
[im 43/62  soft-tissue]
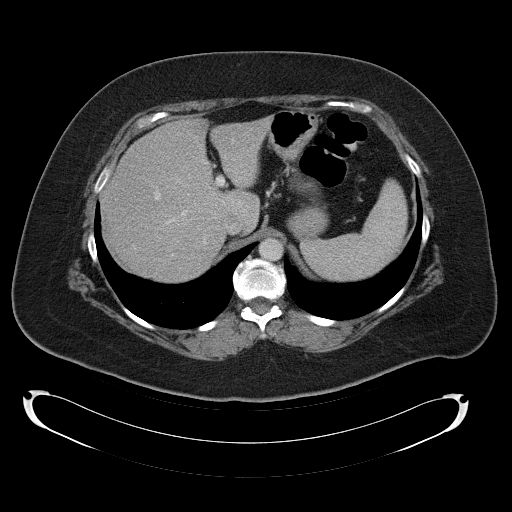
[im 43/62  bone]
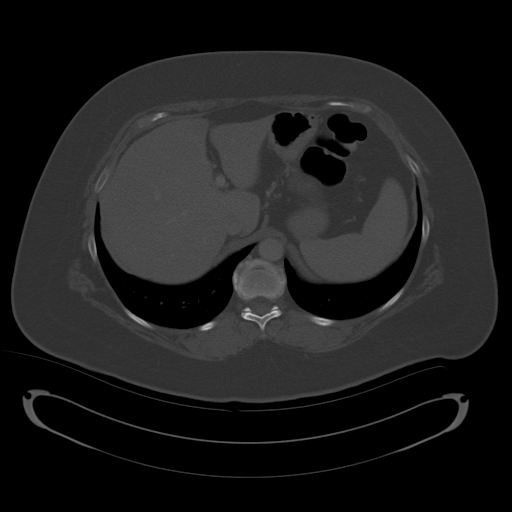
[im 47/62  soft-tissue]
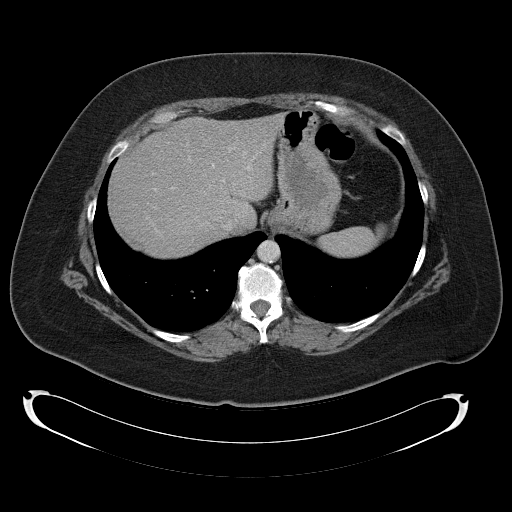
[im 52/62  soft-tissue]
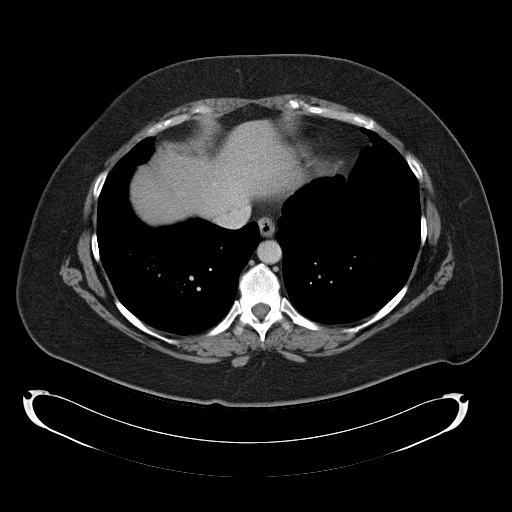
[im 57/62  soft-tissue]
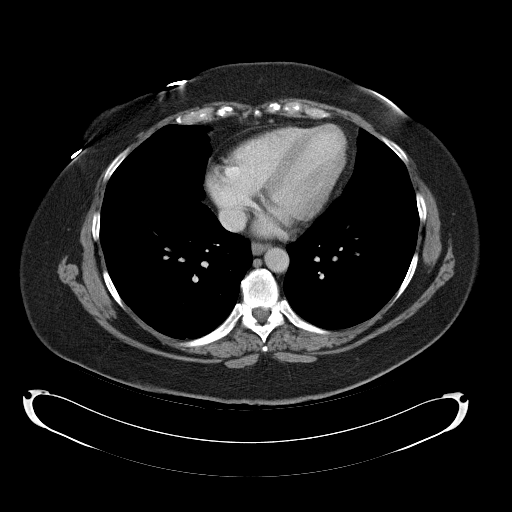

[15 of 46 positions shown; findings below may reference images not displayed]

FINDINGS: Lung bases clear.
Minimal fatty infiltration of liver.
Liver, spleen, pancreas, kidneys, and adrenal glands normal.
Mild scattered atherosclerotic calcification aorta.
Scattered normal-sized lymph nodes in mesentery.
Stomach and bowel loops grossly normal appearance.
No mass, adenopathy, free fluid or inflammatory process.
Gallbladder is unremarkable by CT.
No focal gastric, duodenal or or peri duodenal abnormalities to
suggest ulcer disease.
No acute osseous findings.
IMPRESSION: No acute upper abdominal abnormalities identified.

Findings called to Dr. GASRIN on [DATE] at [YW] hours.

## 2010-09-30 MED ORDER — IOHEXOL 300 MG/ML  SOLN
100.0000 mL | Freq: Once | INTRAMUSCULAR | Status: AC | PRN
  Administered 2010-09-30: 100 mL via INTRAVENOUS

## 2010-10-25 NOTE — Cardiovascular Report (Signed)
[name], [name]               ACCOUNT NO.:  [id number]   MEDICAL RECORD NO.:  [id number]          PATIENT TYPE:  INP   LOCATION:  2016                         FACILITY:  [facility]   PHYSICIAN:  Ritta Slot, MD     DATE OF BIRTH:  May 21, 1961   DATE OF PROCEDURE:  03/07/2007  DATE OF DISCHARGE:                            CARDIAC CATHETERIZATION   PROCEDURES PERFORMED:  1. Retrograde left heart catheterization.  2. Coronary angiography of the right and left coronary arteries.  3. Left ventriculography.   PATIENT PROFILE:  The patient is a 50 year old obese white female with  past medical history of coronary artery disease status post cutting  balloon to a 50 to 60% LAD lesion in 2004. She has been well since then  but recently had increasing episodes of chest pain at rest and on mild  exertion.  She is undergoing a lot of stress at work and at home with  recently her brother dying of myocardial infarction 10 months ago at the  age of 67 and her ex-husband dying at the age of 66, unknown cause  recently. She was admitted to [facility] last night with  episodes of chest pain presumptive unstable angina, was subsequently  referred for cardiac catheterization for further evaluation of coronary  anatomy.   The patient was brought down to the second floor of [facility]  to the cardiac catheterization lab.  The right groin was prepped in the  usual sterile fashion; 1% lidocaine was infiltrated over the right  femoral artery area and the femoral artery was punctured using a Cook  needle.  A 6 French sheath was introduced into the right SFA and a JL4  catheter, JR4 catheter and a pigtail were used to engage in the left  coronary artery, the right coronary artery and the left ventricle  respectively.  The procedure was performed using the retrograde  technique and femoral axis was obtained using the Seldinger technique.   COMPLICATIONS:  None.   DYE USED:  Omnipaque  throughout the whole procedure.   FINDINGS:  Pressures.  The aortic pressure was 149/89 with a mean of  115, LV pressure was 159/21 and diastolic of 48.  There was no gradient  across the L2 valve on pull back of the pigtail.  LV function was found  to be normal at 60%.  Right coronary artery was found to be widely  patent and free of disease.  Left main coronary artery was widely patent  and free of any disease.  Left anterior descending artery was widely  patent and free of any significant stenosis.  Prior site of cutting  balloon looked excellent. The left circumflex system demonstrated a  normal small left circumflex with a very large obtuse marginal.  Just  distal  to the bifurcation of the circumflex and the obtuse marginal,  there was a 50% 20 mm lesion.  This was a discrete lesion with no  evidence of thrombus or haziness.  It was felt that the lesion was not  significant and therefore the patient was not considered for PCI.  The films were reviewed by my partner, Dr. Lenise Herald who agreed  with this assessment.   IMPRESSION:  Mild coronary artery disease with a 50 to 60% lesion in the  mid-obtuse margin 1, although normal patent coronary arteries, normal  left ventricular function.   PLAN:  The patient should be started on Plavix 75 mg in addition to  aspirin 325 mg for a month and then Plavix continued indefinitely.  She  will be started on Zocor 40 mg daily and she will undergo nuclear  perfusion stress imaging looking for significant lateral ischemia in the  territory of the OM 1.  I will follow up with her in clinic after  nuclear perfusion study and go over the results with her then.  Many  thanks.      Ritta Slot, MD  Electronically Signed     HS/MEDQ  D:  03/07/2007  T:  03/07/2007  Job:  [id number]

## 2010-10-25 NOTE — Discharge Summary (Signed)
[name], [name]               ACCOUNT NO.:  [id number]   MEDICAL RECORD NO.:  [id number]          PATIENT TYPE:  INP   LOCATION:  2016                         FACILITY:  [facility]   PHYSICIAN:  Ritta Slot, MD     DATE OF BIRTH:  Sep 02, 1960   DATE OF ADMISSION:  03/06/2007  DATE OF DISCHARGE:  03/07/2007                               DISCHARGE SUMMARY   [name] is a 50 year old female patient with known coronary disease.  She had undergone a cutting balloon atherectomy for an 80% mid LAD  lesion in August 2004.  At that time her circumflex and RCA were patent.  She had an EF of 60%.  She also has hyperlipidemia, obesity and a  significant premature family history of heart disease.  She came into  the hospital on February 03, 2007, by EMS from her place of work with  complaints of chest pain.  She has been ongoing for about a month and  half and the day of admission it was the worst episode yet.  She was  only on aspirin 81 mg a day.  It was decided to her put on IV heparin,  rule out for an MI, and that she would need to undergo cardiac  catheterization.  On March 07, 2007, she underwent cardiac  catheterization by Ritta Slot, MD.  She had a 50% mid OM lesion.  Her EF was 60%.  On admission she had been put on a beta blocker, ACE  inhibitor and a statin.  After catheterization she was also advised to  be on Plavix and aspirin continued.  Her discharge now is pending her  getting up post catheterization after bed rest.  If her groin is okay  and her blood pressure is stable, she may be discharged home.   LABS:  Her CKs, MBs and troponins were negative.  Her hemoglobin A1c was  5.9.  Total cholesterol is 203, the LDL was 142, HDL was 35, total  triglycerides were 131.  Her sodium was 140, potassium 3.5, BUN 7,  creatinine 0.74.  Hemoglobin 12.1, hematocrit 35.5.  Her platelets were  236 and her WBC was 9.6.   DISCHARGE MEDICATIONS:  1. Aspirin 81 mg a day.  2.  Metoprolol 25 mg twice per day.  3. Altace 2.5 mg a day.  4. Simvastatin 40 mg at bedtime.  5. Prilosec 20 mg once a day, which she may buy over-the-counter.  6. Plavix 75 mg a day.  7. She may use nitroglycerin p.r.n. if needed for chest pain.   For follow-up, she is to have a Persantine Myoview on March 11, 2007, and then she will have a follow-up with Dr. Ritta Slot on  March 13, 2007.  Activity restrictions were written for her.  She  should stay out of work until March 14, 2007.   DISCHARGE DIAGNOSES:  1. Unstable angina.  2. Previous coronary artery disease with left anterior descending      artery atherectomy in 2004, catheterization this admission with a      50% circumflex lesion, which will be evaluated by  a Persantine      Myoview to see if this is causing her ischemia and, in fact, if      this is true angina.  3. Low high-density lipoprotein.  4. Hyperlipidemia.  5. Obesity.  6. Metabolic syndrome.  7. Strong family history of coronary artery disease.  8. Gastroesophageal reflux disease.  9. Increased stress.      Lezlie Octave, N.P.      Ritta Slot, MD  Electronically Signed    BB/MEDQ  D:  03/07/2007  T:  03/08/2007  Job:  [id number]   cc:   Mila Homer. Sudie Bailey, M.D.

## 2010-10-25 NOTE — H&P (Signed)
[name], [name]               ACCOUNT NO.:  [id number]   MEDICAL RECORD NO.:  [id number]          PATIENT TYPE:  EMS   LOCATION:  MAJO                         FAC[name]   PHYSICIAN:  Susan Slot, MD     DATE OF BIRTH:  1960/11/16   DATE OF ADMISSION:  03/06/2007  DATE OF DISCHARGE:                              HISTORY & PHYSICAL   HISTORY OF PRESENT ILLNESS:  Mrs. [name] is a 50 year old white  married female patient who has known coronary artery disease.  She has  been seen in the past by Dr. Dani [facility].  Her primary care doctor is  Dr. John [facility].  She came to the emergency room with chest pain by  EMS from her place of work.  She apparently for the last several weeks,  month and a half, she has been having chest pain.  It has awakened her  at night.  It has been a sharp wave-like pattern.  However today, she  had a  vise-grip chest pain with left arm pain.  She took nitroglycerin  sublingual.  She had no shortness of breath.  She did have diaphoresis.  She had presyncope.  It was wave-like an pattern, and after taking the  nitroglycerin, it eased it off but not completely.  EMS was called and  she was brought to the emergency room.   PAST MEDICAL HISTORY:  Prior medical history includes:  1. ASCVD with LAD cutting balloon procedure for 80% mid LAD by Dr.      Jacinto [facility], August 2004.  Her circumflex and RCA were okay.  Her EF was      60%.  2. Hyperlipidemia.  3. Obesity.  4. Recent ear infection.  5. History of TAH/BSO, May 10, 2004.  6. She has had a recent knee trauma to her left knee.   MEDICATIONS:  Medications are only aspirin 81 mg every day.   ALLERGIES:  PCN AND TYLOX.  THE TYLOX MAKES HER NAUSEATED.   SOCIAL HISTORY:  She lives in [name] with her family.  She is a  IT consultant.  She is married and has two children, ages 30 and 40.  She  quit smoking 16 years ago.   FAMILY HISTORY:  Strong family history of coronary disease.  Her  mother  has coronary disease and peripheral vascular disease; she is age 63; it  started five years ago.  Her father also has coronary disease,  cardiomyopathy.  He is now age 62.  All of her grandparents, she states,  died of an MI.  She has a brother, age 47, who died 10 months ago of an  MI.   REVIEW OF SYSTEMS:  She denies any fever, chills, cold.  She has gained  40 pounds in the last 10 months since her brother died.  She has no rash  or lesions.  Chest pain, shortness of breath, and dyspnea on exertion as  described above.  Denies any frequency or urgency. She is having some  depression and anxiety, increased stress from family and work.  She has  left knee discomfort since  she hit her knee several weeks ago.  No  nausea, no vomiting.  All other systems are negative.   PHYSICAL EXAMINATION:  VITAL SIGNS:  Blood pressure is 120/63, her pulse  is 72, respirations are 40, and her O2 saturations on 100% percent on 2  liters.  GENERAL:  She is in no acute distress.  She is on a stretcher with O2  on, however, she does have intermittent fleeting chest discomfort.  She  is on a nitroglycerin drip at 3 mcg.  HEENT:  Normocephalic.  Pupils equal, round, and equal.  The sclerae are  clear.  Dentition good.  NECK:  Neck is supple without lymphadenopathy, no bruits, no JVD, no  thyromegaly.  Lymphadenopathy is negative.  CARDIAC:  Heart sounds  regular, S1-S2 are present.  No murmur, gallop, or rub.  Pulses are 2+  and equal without bruits.  LUNGS:  Clear to auscultation bilaterally with good inspiratory effort.  ABDOMEN:  Abdomen is soft, obese, nontender.  Organomegaly could not be  detected.  EXTREMITIES:  No rash, no lesions.  She has good dorsalis pedis pulses,  2+ posterior tibial and 2+ dorsalis pedis.  NEUROLOGIC:  She is alert and oriented x3.  Strength is equal  bilaterally.   LABORATORY DATA:  Chest x-ray shows no acute disease.  EKG is sinus  rhythm, no significant  abnormalities.  Labs:  Hemoglobin 13.6,  hematocrit 40, sodium 140, potassium 3.7, BUN 6, glucose 123.  CK-MB is  less than 1.0, troponin less than 0.05.   ASSESSMENT:  1. Unstable angina.  2. Known coronary disease with previous left anterior descending      atherectomy.  3. History of hyperlipidemia, not on therapy.  She states she has not      seen Dr. Domingo [facility] for two years.  She states she has not had any      hyperlipidemia lately  4. Obesity.  5. Increased stress.  6. Significant family history of coronary artery disease.   PLAN:  She was seen and evaluated by Dr. Ritta [facility] in the emergency  room.  She is going to be admitted.  He recommends intravenous heparin  and for her to undergo catheterization tomorrow.  We will put her on  appropriate medications such as aspirin - full dose, beta blocker, ACE  inhibitor, and also a Statin.  We will check the fasting lipids in the  morning.  She will undergo catheterization tomorrow, risks and benefits  explained, less 1% for death, CVA, or need for open heart surgery  emergently.      [name], N.P.      Susan Slot, MD  Electronically Signed    [name]/MEDQ  D:  03/06/2007  T:  03/07/2007  Job:  [id number]   cc:   [name]. [name], M.D.  Susan Gobble, MD

## 2010-10-28 NOTE — H&P (Signed)
[name], [name]               ACCOUNT NO.:  [id number]   MEDICAL RECORD NO.:  [id number]           PATIENT TYPE:   LOCATION:                                 FACILITY:   PHYSICIAN:  Langley Gauss, MD     DATE OF BIRTH:  12/10/1960   DATE OF ADMISSION:  05/10/2004  DATE OF DISCHARGE:  LH                                HISTORY & PHYSICAL   PLANNED PROCEDURES:  Total abdominal hysterectomy and bilateral salpingo-  oophorectomy.   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  The patient was offered serial Provera withdrawals with close  monitoring and rebiopsy versus definitive surgical management.  The patient  considered her options, did her own research and with the presence of the  complex hyperplasia with atypia, the patient has decided to proceed with TAH  and BSO.  She likewise understands that removal of the ovaries bilaterally  will result in the onset of surgical menopause, requiring initiation of  hormone replacement therapy to avoid severe menopausal symptoms.   HISTORY:  The patient is a 50 year old, obese, white female, gravida 2, para  2 with two prior vaginal deliveries.  She was seen for her initial visit on  February 10, 2004.  At that time, the patient complained of previous very  regular predictable periods on a monthly basis, but then in July had one day  of pink spotting only.  In the month of August, she bled the entire month,  bleeding on a daily basis with no signs of any letting up of the bleeding.  She had to increase the size of her tampon from regular slender to super  absorbent.  Describes having bad uterine cramping for that week.  Utilizing  condoms for birth control.  Pertinently, however, the menstrual periods  prior to that dated service, which she had described as regular and  predictable, were not associated with any dysmenorrhea or  menorrhagia or  premenstrual-type symptoms.  The patient had taken birth control pills up  until age 33.  However, due to her family history of strokes and heart  disease, she discontinue the birth control pills at age 50 due to the  estrogen content and subsequently denied any reinitiation of birth control  pills.  On the initial dated visit, February 10, 2004, due to the presence of  a moderate amount of active bleeding on that date, we proceeded with an  endometriosis biopsy as both the diagnostic and therapeutic maneuver.  That  endometrial biopsy done in the office did reveal the presence of a simple  cystic hyperplasia with a considerable amount of tubal metaplasia.  Following the endometrial biopsy, she received a two-day course of estrogen  followed by a five-day Provera withdrawal in an effort to regulate her  menses.  The patient subsequently had very heavy withdrawal bleed with  passage of clots and cramping using super tampons and maxi pads.  Moderately  heavy bleeding which did interfere with her work ability during that point  in time.  Subsequently with that history of the abnormal endometrial biopsy  previously and the initial failure of any significant improvement with a  Provera withdrawal, on the date of visit following the endometrial biopsy,  the patient was scheduled for hysteroscopy and D&C, which were performed on  February 26, 2004.  Apparently during that hysteroscopy and D&C done on  February 26, 2004, the patient was noted to have an enlarged uterus sounded  to a depth of 10 cm, retroflexed very prominent fundal portion of the  uterus.  A large amount of normal-appearing tissue was obtained by  aggressively curetting the entire uterine cavity.  It was at this point in  time which the definitive pathologic finding was made of the complex  hyperplasia with atypia, which is certainly to be considered a precancerous  condition.  Subsequently after doing her own  research on these pathologic  conditions, the patient chose the definite surgical management.   CURRENT MEDICATIONS:  Baby aspirin 81 mg p.o. daily.   PAST MEDICAL HISTORY:  1.  The patient does have a significant history for heart disease, as well      as a very strong family history.  The patient did undergo cardiac      catheterization and angioplasty in August of 2004.  Subsequent cardiac      functioning is essentially normal.  2.  The patient does have a history of a fractured tibia and fibula in      September of 2001.  3.  Vaginal deliveries in 1993 and 1995.   REVIEW OF SYSTEMS:  Pertinent only for slow weight gain.  No complaints of  genuine stress urinary incontinence.  No overactive bladder.  No chronic  constipation, diarrhea or bloody stools.  Only occasional hot flashes or  night sweats are noted.   SOCIAL HISTORY:  The patient is a nonsmoker, having quit smoking in 1992.  She does exercise regularly.  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  Her husband is employed  by the [employer].   PHYSICAL EXAMINATION:  GENERAL APPEARANCE:  Morbidly obese.  WEIGHT:  263 pounds.  VITAL SIGNS:  The pulse is 73 and blood pressure 125/81.  HEENT:  Negative.  The mucous membranes are moist.  NECK:  No adenopathy.  The neck is supple.  LUNGS:  Clear.  CARDIOVASCULAR:  Regular rate and rhythm.  ABDOMEN:  Soft and nontender.  No surgical scars were identified.  The  ovaries and uterus are nonpalpable on abdominal examination.  EXTREMITIES:  Noted to be normal.  PELVIC:  Normal external genitalia.  The bladder is well supported.  No  urethral detachment.  The cervix is well visualized.  A Pap smear was  performed, which subsequently returned class I.  Bimanual exam revealed a  lobular-shaped uterus, very prominent fundal portion identified, freely  mobile and mildly tender to manipulation.  LABORATORIES:   Pathology reports as discussed above.  Pap smear is class I  dated February 10, 2004.  Subsequent reports also showed a TSH was within  normal limits.  Quantitated beta hCG obtained previously is negative.   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  This is to be considered a precancerous  condition.  In addition, it is compromising her lifestyle due to the  increasing irregularity of her menstrual periods and passage of large clots  with significant dysmenorrhea.  Options have  been offered to the patient  previously with Provera withdrawals and serial biopsies versus definitive  surgical management.  The patient had opted to proceed with TAH and BSO.  The risks and benefits of the surgical procedure were discussed with the  patient at that time to include the risks of hemorrhage, infection, possible  bowel or bladder injury and necessity of initiating hormone replacement  therapy immediately preoperatively to avoid symptom associated with surgical  menopause.  Arrangements have been made for her husband to assist with her  care immediately following discharge from the hospital.     [facility]   DC/MEDQ  D:  04/19/2004  T:  04/19/2004  Job:  [id number]

## 2010-10-28 NOTE — Op Note (Signed)
[name], [name]               ACCOUNT NO.:  [id number]   MEDICAL RECORD NO.:  [id number]          PATIENT TYPE:  INP   LOCATION:  A420                          FACILITY:  [facility]   PHYSICIAN:  Langley Gauss, MD     DATE OF BIRTH:  11/01/1960   DATE OF PROCEDURE:  05/10/2004  DATE OF DISCHARGE:                                 OPERATIVE REPORT   PREOPERATIVE DIAGNOSES:  1.  Menometrorrhagia.  2.  Previous specimen on D&C hysteroscopy revealing presence of complex      atypical hyperplasia.   PROCEDURE:  Total abdominal hysterectomy/bilateral salpingo-oophorectomy.   SURGEON:  Langley Gauss, M.D.   ESTIMATED BLOOD LOSS:  300 cc.   ANESTHESIA:  General endotracheal. In addition at completion of the  procedure, 30 cc 0.5% bupivacaine plain was injected along the entirety of  the skin incision.   DRAINS:  Foley catheter, sterilely placed draining clear yellow urine. JP  catheter was in the subcutaneous space.   OPERATIVE FINDINGS:  Diffusely enlarged uterus. A single 1-cm subserosal  fibroid, anterior fundal portion of the uterus. Normal tubes and ovaries  bilaterally.   SUMMARY:  The patient's vital signs were stable. Discussed the planned  operative procedure with the patient immediately before surgery in the  holding area. The patient's vital signs were stable. She was taken to the  operating room where she underwent uncomplicated induction of general  endotracheal anesthesia. Foley catheter sterilely placed to straight  drainage. She was then sterilely prepped and draped in the usual manner. A  knife was used to incise a Pfannenstiel incision in the skin, dissecting off  of the fascial plane, cauterizing bleeders along the way. The fascia was  then incised in a transverse curvilinear manner while dissecting off the  underlying rectus muscle in the avascular plane. Fascial edges are grasped  using straight Kocher clamps, dissecting off the underlying rectus in the  midline, pulled superiorly and inferiorly utilizing the Mayo scissors.  Rectus muscle was then bluntly separated. Peritoneal cavity was  atraumatically bluntly entered at the superior most portion of the incision.  Peritoneal incisions extended superiorly and inferiorly. Inferiorly, we  visualized the bladder to avoid its accidental injury. Balfour self-  retaining retractor was then placed with 2 moist packs used to immobilize  bowel out of the operative field. Minimal adhesive disease was encountered  in the left adnexal portion with the ovary adherent to the pelvic side wall.  This was freed up with gentle blunt manipulation. Long straight Kocher  clamps were used to grasp the specimen at the junction of the round ligament  and the fallopian tubes at the uterus itself. This allowed manipulation  throughout the operative field; operative findings as discussed previously.  The patient is noted to have a very deep pelvis. The right round ligament is  clamped with a Kelly clamp. It is then ligated with 0 Vicryl suture in a  Heaney fashion. The right round ligament was transected, dissected in the  avascular plane towards the ovary, allowing skeletonization of the  infundibulopelvic ligament on the right. This was then doubly  clamped with  Kelly clamps, doubly ligated with 0 Vicryl free tie followed by suture  ligature of 0 Vicryl in a Heaney fashion. The left side is handled in a  likewise manner with a Kelly clamp placed on the round ligament followed by  0 Vicryl suture in a Heaney fashion. Left round ligament was transected  which then allowed skeletonization of the infundibulopelvic ligament on the  left which was then doubly clamped with Kelly clamps, doubly ligated with 0  Vicryl free tie followed by 0 Vicryl in a Heaney fashion. Note, the clamps  were placed very close to the ovary itself on the infundibulopelvic ligament  to avoid any lateral injury to the ureters which are noted to  be normally  placed anatomically in the lateral pelvic side wall. Additionally, efforts  are made to be certain that the entirety of the ovary is included the  specimen removed. I then continued in the free space of the broad ligament  containing the anterior anteriorly across the lower uterine segment in the  avascular plane which allows normalization of the bladder flap from the  avascular uterine fold. Continued dissection resulted in skeletonization of  the right uterine vessel. Kelly clamp was placed to control back bleeding. A  curved Heaney clamp is placed at the junction of the cervix with the body of  the uterus itself, placed right at this junction. A single suture of 0  Vicryl is placed to control this vascular pedicle. The left uterine vessel  is likewise skeletonized; however, a Kelly clamp was not needed to be placed  to control back bleeding. Curved Heaney clamp was placed on the left uterine  vessel followed by 0 Vicryl suture. This now controls the major vascular  supply to the uterus which then allows me to cut across the upper most  portion of the cervix, performing a supracervical hysterectomy, handing off  the tubes, ovaries, and uterine fundus as specimen. The portion of the  cervix remaining was then grasped using straight Kocher clamps. The  endocervical canal was identified. The cardinal ligaments on each side are  sequentially clamped and ligated, utilizing straight Heaney clamps, followed  by suture ligature of 0 Vicryl in a Heaney fashion. Uterosacral ligaments  bilaterally were likewise sequentially clamped with curved Heaney clamps  followed by suture ligature of 0 Vicryl in a Heaney fashion. The right  vaginal angle was identified, clamped with a curved Heaney clamp, and  ligated with 0 Vicryl in a Heaney fashion. This was tagged for later  inspection. Left vaginal angle likewise was identified. Curved Heaney clamp was placed here which was then ligated with 0  Vicryl in a Heaney fashion.  This was also tagged for later inspection. This then allows me to transect  across the upper most portion of the vaginal mucosa at the distant most  portion of the cervix and remove the entirety of the cervix which is  examined and noted to be contained within the specimen handed off. Efforts  were made to have maximum vaginal depth remaining following the procedure.  Irrigation was then performed of the upper vaginal canal utilizing a  Betadine solution. The remainder of the vaginal cuff was then closed  utilizing a total of 4 sequentially placed 0 Vicryl sutures in a figure-of-  eight fashion. All of her pedicles were examined. There was noted to be  excellent hemostasis. No active bleeding is noted to be occurring. Copious  irrigation is performed until clear which likewise also  assures hemostasis.  No reperitonealization is performed. There is no evidence of any injury to  the ureters laterally. Moist packs are removed. Balfour retractor is  removed. Sponge, needle, and instrument counts are correct x2 at this point.  Peritoneal edges are grasped using Kelly clamps, and the peritoneum was  closed with a continuous running 0 Vicryl suture. The rectus muscles are  reapproximated in the midline utilizing continuous running 0 chromic. The  fascia is closed with a looped #1 continuous running PDS suture.  Subcutaneous bleeders are cauterized. A JP drain is placed in the  subcutaneous space with a separate exit wound to the left apex in the  incision. JP drain is sutured into place separately here. Three horizontal  mattress sutures of #1 PDS were then placed along the skin incision to act  as retention type sutures and facilitate skin closure. Thirty cc 0.5%  bupivacaine was then injected along the entirety of the incision just  beneath the epidermis. The skin was then easily closed utilizing skin  staples. The patient tolerated the procedure very well. She  continues to  drain clear yellow urine. Vital signs were stable. The patient was reversed  from anesthesia, taken to the recovery room in stable condition at which  time operative findings discussed with the patient's waiting husband as well  as their pastor. The patient has undergone surgical menopause; thus, in the  recovery room, she will be treated with Climara patch 0.1 mg administered  q.d.     Vira Blanco   DC/MEDQ  D:  05/10/2004  T:  05/11/2004  Job:  [id number]

## 2010-10-28 NOTE — H&P (Signed)
NAME:  [name], [name]                         ACCOUNT NO.:  [id number]   MEDICAL RECORD NO.:  [id number]                   PATIENT TYPE:  AMB   LOCATION:  DAY                                  FACILITY:  [facility]   PHYSICIAN:  Langley Gauss, M.D.                DATE OF BIRTH:  [birth year]   DATE OF ADMISSION:  02/26/2004  DATE OF DISCHARGE:                                HISTORY & PHYSICAL   PLANNED PROCEDURE:  Hysteroscopy, dilatation and curettage.   INDICATION:  Abnormal uterine bleeding with endometrial biopsy performed  February 12, 2004 revealing presence of a simple cystic hyperplasia.   HISTORY:  See previous dictated note dated February 12, 2004.  The patient  underwent endometrial biopsy on that date in an effort to provide diagnostic  and therapeutic information.  Subsequently, she was treated by a 2-day  course of estrogen followed by a 5-day Provera withdrawal in effort to  regulate her menses.  The patient had subsequent very heavy flow starting on  about February 17, 2004, continuing very heavy with passage of clots and  cramping, and only diminishing in quantity of flow recently at about  February 24, 2004, thus again, for diagnostic and therapeutic reasons,  patient is to undergo hysteroscopy and D&C.   PHYSICAL EXAMINATION:  VITAL SIGNS:  Two hundred and sixty-three pounds.  Pulse 73, blood pressure 125/81.  PELVIC:  Examination revealed normal external genitalia, cervix without  lesions, moderate vaginal bleeding identified.  Uterus and ovaries  nonpalpable on examination with no significant pelvic masses.   IMPRESSION AND PLAN:  Review of the laboratory results reveal endometrial  biopsy dated February 12, 2004 with simple cystic hyperplasia which is to be  considered a functional disorder as the result of anovulatory cycle.  Patient did have very heavy cycle following a recent Provera withdrawal with  very heavy bleeding and passage of clots, thus outpatient  surgical procedure  is for diagnostic and therapeutic indications.  Risks and benefits of the  procedure were discussed with the patient to include risks of hemorrhage and  infection; she accepts these risks and opts to proceed at this time.     ___________________________________________                                         Langley Gauss, M.D.   DC/MEDQ  D:  02/26/2004  T:  02/26/2004  Job:  [id number]

## 2010-10-28 NOTE — Op Note (Signed)
NAME:  [name], [name]                         ACCOUNT NO.:  [id number]   MEDICAL RECORD NO.:  [id number]                   PATIENT TYPE:  AMB   LOCATION:  DAY                                  FACILITY:  [facility]   PHYSICIAN:  Langley Gauss, M.D.                DATE OF BIRTH:  24-Aug-1960   DATE OF PROCEDURE:  DATE OF DISCHARGE:  02/26/2004                                 OPERATIVE REPORT   DATE OF PROCEDURE:  February 26, 2004.   PREOPERATIVE DIAGNOSES:  1.  Menometrorrhagia.  2.  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   POSTOPERATIVE DIAGNOSES:  1.  Menometrorrhagia.  2.  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   PROCEDURE PERFORMED:  1.  Hysteroscopy.  2.  Dilatation and curettage.   SURGEON:  Lianne Cure. Lisette Grinder, MD.   ESTIMATED BLOOD LOSS:  Minimal.   SPECIMENS:  Endocervical curettings, uterine curettings, sent separately.   OPERATIVE FINDINGS:  A diffusely enlarged uterus sounding to a depth of 10  cm.  There was minimal descent of the cervix and uterus with traction  applied.  Incidental 10-point bleeding point is noted from the single tooth  tenaculum on the 10 o'clock position of the cervix, which required suturing  with a 0 chromic suture.  Findings include a very thick intrauterine tissue  with multiple polyps identified; however, these are unable to be excised  separately as there are no operating instruments available for the  hysteroscope. Apparently, they have all been sent out for repair.  The  anterior lip of the cervix is grasped with a single tooth tenaculum.  The  uterus is noted to sound to a depth of 10 cm and found to be slightly  retroflexed, with a very prominent fundus.  Progressive dilatation is then  performed up to a size #27 dilator, which allows passage of the trocar and  sleeve easily into the uterine cavity itself.  This is connected to the  scope and allows adequate visualization of  the uterine cavity.  Multiple  photographs taken revealing the very thick tissue present, particularly at  the 9 o'clock position of the uterus.  No complications occurred in the  hysteroscopy; the hysteroscope is thus removed.  No further dilatation of  the cervix is required.  Curettage is then performed of the entire uterine  cavity utilizing first a small and then the large banjo curette until a  fine, gritty sensation was appreciated in all quadrants of the uterus with  no additional tissue being obtained.  No complications occurred during the  curettage portion.  Initially, endocervical curettings were obtained, and  then uterine curettings were obtained.  A large amount of normal-appearing  tissue was obtained and sent for permanent section only.  Following this,  the figure-of-eight suture of 0 chromic is placed at the cervix at 10  o'clock.  The patient tolerated the procedure very  well.  She was reversed  from anesthesia and taken to the recovery room in stable condition.   DISCHARGE SUMMARY:  Patient satisfied all criteria.  She had no  postoperative complications, thus she was discharged home on same date of  service, February 26, 2004.   DISCHARGE MEDICATIONS:  She has a prescription for Vicoprofen at home.  Patient was treated preoperatively with 600 mg of IV Cleocin.   PERTINENT LABORATORY STUDIES:  TSH is normal, hCG is negative.   HOSPITAL COURSE:  The patient was processed through the Ambulatory Surgical  Unit.  The operative procedure was performed without complications.  The  patient did well in a postoperative recovery room.  Discharged to home on  same date of service, February 26, 2004.   DISPOSITION:  To follow up in the office in one week's time at which time  our pathology results will be available.  Patient will likely require  cycling with Provera to prevent recurrence of the hyperplasia.  I discussed  with the patient's family after the procedure at  which time they provided  the history that up to about one year ago the patient was having fairly  irregular menstrual periods.      DC/MEDQ  D:  02/27/2004  T:  02/28/2004  Job:  [id number]

## 2010-10-28 NOTE — Discharge Summary (Signed)
   [name], [name]                         ACCOUNT NO.:  [id number]   MEDICAL RECORD NO.:  [id number]                   PATIENT TYPE:  INP   LOCATION:  6523                                 FACILITY:  [facility]   PHYSICIAN:  Dani Gobble, MD                    DATE OF BIRTH:  11/21/1960   DATE OF ADMISSION:  02/02/2003  DATE OF DISCHARGE:  02/04/2003                                 DISCHARGE SUMMARY   DISCHARGE DIAGNOSES:  1. Unstable angina.  2. Coronary diagnosis, status post elective LAD, cutting balloon     intervention, this admission, by Dr. Jacinto Halim.  3. Hyperlipidemia.  4. Obesity.   HOSPITAL COURSE:  Patient is a 50 year old female, with a history of  hyperlipidemia, that is not treated.  She had seen Dr. Domingo Sep in the past  for chest pain and had a Cardiolite that was negative.  She presented as a  transfer from [facility] with chest pain consistent with unstable angina.  She does have a family history of heart trouble.   She was admitted to telemetry, started on IV heparin, aspirin, beta blocker  and nitrates.  She underwent diagnostic catheterization, February 03, 2003.  She had an 80% mid LAD narrowing by IVUS.  She underwent cutting balloon  intervention with good final results.  Circumflex was normal.  RCA was  normal.  EF was 60%.   The plan is for continued risk factor modification and aggressive medical  therapy.  We feel she can be discharged, February 04, 2003.   LABORATORY DATA:  At discharge, white count 8.7, hemoglobin 8.9, hematocrit  34.8, platelets 214; sodium 138, potassium 3.9, BUN 7, creatinine 0.7; CK-MB  and troponins are negative.  Urine pregnancy test was negative.  Lipid  profile shows a cholesterol of 211, triglycerides 253, HDL 40, LDL 120; INR  is 1.0.  EKG reveals sinus rhythm without acute changes.  Chest x-ray showed  essentially no acute process.   DISCHARGE MEDICATIONS:  1. Zocor 80 mg a day.  2. Coated aspirin q.d.  3. Plavix 75 mg a  day.  4. Toprol XL 25 mg a day.  5.     Accupril 5 mg a day.  6. Nitroglycerin sublingual p.r.n.   FOLLOW UP:  She will follow up with Dr. Domingo Sep February 17, 2003 at  11:30.      Abelino Derrick, P.A.                      Dani Gobble, MD    LKK/MEDQ  D:  02/04/2003  T:  02/05/2003  Job:  [id number]   cc:   Alen Blew, M.D.  [place]

## 2010-10-28 NOTE — Group Therapy Note (Signed)
NAMEREET, SCHARRER                         ACCOUNT NO.:  [id number]   MEDICAL RECORD NO.:  [id number]                   PATIENT TYPE:  EMS   LOCATION:  ED                                   FACILITY:  [facility]   PHYSICIAN:  Mila Homer. Sudie Bailey, M.D.           DATE OF BIRTH:  04-30-1961   DATE OF PROCEDURE:  02/02/2003  DATE OF DISCHARGE:                                   PROGRESS NOTE   SUBJECTIVE:  A 50 year old called me this morning telling me she developed  left chest pain radiating down the left arm starting last night and  afternoon.  She had used two nitroglycerin from home which gave her some  relief.  These caused some burning under the tongue but no headache and were  out of date as of September 2004.  She was still having pain in the left arm  when she called me this morning so I recommended she be seen in the ER.   She has been seen at [facility] in the past.  She has had a  Cardiolite stress which apparently was negative but ejection fraction was in  the 40s.  She also has a history of bilateral carpal tunnel syndrome.  Currently she tried to cut back on typing to see if that would improve.   Currently lives with husband and two daughters in [place].   She has had no neck pain, no real nausea or vomiting.  She did smoke  cigarettes from about age 69 to 61, averaging about a pack a day, but has  stopped and has not smoked in the last 12 years.   OBJECTIVE:  She is seen in the ER.  She is well-developed, well-nourished,  no acute distress at the time I see her; in fact, pain is somewhat better  (she has had a nitroglycerin which has caused headache and sublingual  burning).  Vital signs showed a pulse of 81, blood pressure 98/66.  Her O2  saturation is 97%.  The heart has a regular rhythm without murmur, rate of  about 80.  The lungs are clear throughout, moving air well.  There is no  pain on palpation and pressure over the left anterior chest in the  area just  inferior to the left breast.  The abdomen is soft and obese without  hepatosplenomegaly or mass, no tenderness.  No edema of the ankles.   ASSESSMENT:  1. Chest pain with radiation to the left arm.  2. Bilateral carpal tunnel syndrome.  3. Obesity.  4. History of cigarette smoking (probably about 10 pack-years).   PLAN:  EKG and enzymes (EKG reviewed and is essentially normal).  Cardiology  to see her.  Possibly will need further workup including a catheterization  despite her negative workup last year.  Mila Homer. Sudie Bailey, M.D.    SDK/MEDQ  D:  02/02/2003  T:  02/02/2003  Job:  [id number]

## 2010-10-28 NOTE — Discharge Summary (Signed)
[name], [name]               ACCOUNT NO.:  [id number]   MEDICAL RECORD NO.:  [id number]          PATIENT TYPE:  INP   LOCATION:  A420                          FACILITY:  [facility]   PHYSICIAN:  Langley Gauss, MD     DATE OF BIRTH:  1961/05/05   DATE OF ADMISSION:  05/10/2004  DATE OF DISCHARGE:  12/01/2005LH                                 DISCHARGE SUMMARY   PROCEDURE PERFORMED:  TAH/BSO.   DISPOSITION:  The patient is to follow up in the office in four days time  for staple removal from the Pfannenstiel incision.  She is given a copy of  standard discharge instructions.   DISCHARGE MEDICATIONS:  Dilaudid 4 mg tablet, one-half to one p.o. q.4-6h.  p.r.n. for postoperative pain.   The JP drain is removed on the day of discharge.   Final pathology is currently pending.   PERTINENT LABORATORY STUDIES:  O positive blood type, hemoglobin 12.8,  hematocrit 38.0 with a white count of 6.5, postoperative day number 1,  10.6/30.7.  White count is 7.3.  Electrolytes are within normal limits.  O-  positive blood type.   HOSPITAL COURSE:  The patient was admitted and underwent TAH/BSO on May 10, 2004, without complications.  Postoperatively, she did well.  She had a  JP drain within the subcutaneous space, a Foley catheter to straight  drainage.  She remained afebrile.  Vital signs remained stable.  She was up,  ambulatory on postoperative day #1 with Foley catheter removed.  She  advanced to regular, general diet.  JP drain was aspirated and began  draining clear, serous type subcutaneous fatty tissue.  The patient was  treated with PCA Dilaudid during the immediate postoperative period.  Subsequent to that, she was tried on p.o. Dilaudid.  She also did well on  the p.o. Dilaudid.  The patient had normal resumption of bowel function, had  no vaginal bleeding.  All of her findings discussed with the patient and her  husband prior to discharge.     [name]   DC/MEDQ  D:   05/14/2004  T:  05/15/2004  Job:  [id number]

## 2011-03-23 LAB — I-STAT 8, (EC8 V) (CONVERTED LAB)
Acid-Base Excess: 1
Chloride: 105
Hemoglobin: 13.6
Potassium: 3.7
Sodium: 140
TCO2: 28
pH, Ven: 7.356 — ABNORMAL HIGH

## 2011-03-23 LAB — COMPREHENSIVE METABOLIC PANEL
Albumin: 3.4 — ABNORMAL LOW
Alkaline Phosphatase: 97
BUN: 7
CO2: 27
Chloride: 103
Creatinine, Ser: 0.74
GFR calc non Af Amer: >60
Glucose, Bld: 110 — ABNORMAL HIGH
Potassium: 3.5
Total Bilirubin: 0.3

## 2011-03-23 LAB — URINALYSIS, MICROSCOPIC ONLY
Glucose, UA: NEGATIVE
Hgb urine dipstick: NEGATIVE
Ketones, ur: NEGATIVE
pH: 6

## 2011-03-23 LAB — HEMOGLOBIN A1C: Hgb A1c MFr Bld: 5.9

## 2011-03-23 LAB — DIFFERENTIAL
Basophils Absolute: 0
Basophils Relative: 0
Lymphocytes Relative: 27
Monocytes Absolute: 0.9 — ABNORMAL HIGH
Neutro Abs: 5.7

## 2011-03-23 LAB — MAGNESIUM: Magnesium: 1.9

## 2011-03-23 LAB — CBC
HCT: 37.8
Hemoglobin: 12.1
Hemoglobin: 12.7
MCHC: 33.6
MCV: 82
Platelets: 268
RBC: 4.34
RDW: 13.4
WBC: 9.6

## 2011-03-23 LAB — CK TOTAL AND CKMB (NOT AT ARMC)
CK, MB: 1.7
Relative Index: INVALID
Relative Index: INVALID
Total CK: 71
Total CK: 77

## 2011-03-23 LAB — POCT I-STAT CREATININE: Creatinine, Ser: 0.7

## 2011-03-23 LAB — POCT CARDIAC MARKERS
Myoglobin, poc: 55.4
Operator id: 294521
Operator id: 294521
Troponin i, poc: <0.05

## 2011-03-23 LAB — LIPID PANEL
HDL: 35 — ABNORMAL LOW
Total CHOL/HDL Ratio: 5.8

## 2011-03-23 LAB — HEPARIN LEVEL (UNFRACTIONATED): Heparin Unfractionated: 0.79 — ABNORMAL HIGH

## 2011-03-23 LAB — APTT: aPTT: 32

## 2012-04-02 ENCOUNTER — Encounter (HOSPITAL_COMMUNITY): Payer: Self-pay | Admitting: *Deleted

## 2012-04-02 ENCOUNTER — Inpatient Hospital Stay (HOSPITAL_COMMUNITY)
Admission: EM | Admit: 2012-04-02 | Discharge: 2012-04-03 | DRG: 124 | Disposition: A | Discharge status: Home / Self Care | Payer: BC Managed Care – PPO | Attending: Cardiovascular Disease | Admitting: Cardiovascular Disease

## 2012-04-02 ENCOUNTER — Emergency Department (HOSPITAL_COMMUNITY): Payer: BC Managed Care – PPO

## 2012-04-02 DIAGNOSIS — I200 Unstable angina: Secondary | ICD-10-CM

## 2012-04-02 DIAGNOSIS — Z6841 Body Mass Index (BMI) 40.0 and over, adult: Secondary | ICD-10-CM

## 2012-04-02 DIAGNOSIS — Z8249 Family history of ischemic heart disease and other diseases of the circulatory system: Secondary | ICD-10-CM

## 2012-04-02 DIAGNOSIS — Z79899 Other long term (current) drug therapy: Secondary | ICD-10-CM

## 2012-04-02 DIAGNOSIS — E8881 Metabolic syndrome: Secondary | ICD-10-CM | POA: Diagnosis present

## 2012-04-02 DIAGNOSIS — Z87891 Personal history of nicotine dependence: Secondary | ICD-10-CM

## 2012-04-02 DIAGNOSIS — Z7982 Long term (current) use of aspirin: Secondary | ICD-10-CM

## 2012-04-02 DIAGNOSIS — I2510 Atherosclerotic heart disease of native coronary artery without angina pectoris: Secondary | ICD-10-CM | POA: Diagnosis present

## 2012-04-02 DIAGNOSIS — E669 Obesity, unspecified: Secondary | ICD-10-CM

## 2012-04-02 DIAGNOSIS — Z9861 Coronary angioplasty status: Secondary | ICD-10-CM

## 2012-04-02 DIAGNOSIS — I252 Old myocardial infarction: Secondary | ICD-10-CM

## 2012-04-02 DIAGNOSIS — R079 Chest pain, unspecified: Secondary | ICD-10-CM

## 2012-04-02 DIAGNOSIS — I10 Essential (primary) hypertension: Secondary | ICD-10-CM | POA: Diagnosis present

## 2012-04-02 DIAGNOSIS — E785 Hyperlipidemia, unspecified: Secondary | ICD-10-CM

## 2012-04-02 DIAGNOSIS — Z23 Encounter for immunization: Secondary | ICD-10-CM

## 2012-04-02 HISTORY — DX: Metabolic syndrome: E88.81

## 2012-04-02 HISTORY — DX: Hyperlipidemia, unspecified: E78.5

## 2012-04-02 HISTORY — DX: Morbid (severe) obesity due to excess calories: E66.01

## 2012-04-02 HISTORY — DX: Atherosclerotic heart disease of native coronary artery without angina pectoris: I25.10

## 2012-04-02 HISTORY — DX: Unstable angina: I20.0

## 2012-04-02 HISTORY — DX: Body Mass Index (BMI) 40.0 and over, adult: Z684

## 2012-04-02 HISTORY — DX: Shortness of breath: R06.02

## 2012-04-02 HISTORY — DX: Acute myocardial infarction, unspecified: I21.9

## 2012-04-02 HISTORY — DX: Pneumonia, unspecified organism: J18.9

## 2012-04-02 LAB — BASIC METABOLIC PANEL
BUN: 10 mg/dL (ref 6–23)
Calcium: 9.7 mg/dL (ref 8.4–10.5)
GFR calc Af Amer: >90 mL/min (ref >90)
GFR calc non Af Amer: >90 mL/min (ref >90)
Glucose, Bld: 128 mg/dL — ABNORMAL HIGH (ref 70–99)
Potassium: 4.7 mEq/L (ref 3.5–5.1)
Sodium: 137 mEq/L (ref 135–145)

## 2012-04-02 LAB — CK TOTAL AND CKMB (NOT AT ARMC): Relative Index: INVALID (ref 0.0–2.5)

## 2012-04-02 LAB — PROTIME-INR: Prothrombin Time: 14.6 seconds (ref 11.6–15.2)

## 2012-04-02 LAB — HEPATIC FUNCTION PANEL
ALT: 17 U/L (ref 0–35)
AST: 20 U/L (ref 0–37)
Albumin: 3.8 g/dL (ref 3.5–5.2)
Alkaline Phosphatase: 105 U/L (ref 39–117)
Total Protein: 7.9 g/dL (ref 6.0–8.3)

## 2012-04-02 LAB — CBC
MCH: 27.2 pg (ref 26.0–34.0)
MCHC: 33.6 g/dL (ref 30.0–36.0)
Platelets: 242 10*3/uL (ref 150–400)

## 2012-04-02 LAB — PLATELET INHIBITION P2Y12: Platelet Function  P2Y12: 221 [PRU] (ref 194–418)

## 2012-04-02 LAB — POCT I-STAT TROPONIN I: Troponin i, poc: 0.01 ng/mL (ref 0.00–0.08)

## 2012-04-02 LAB — HEPARIN LEVEL (UNFRACTIONATED): Heparin Unfractionated: 0.15 IU/mL — ABNORMAL LOW (ref 0.30–0.70)

## 2012-04-02 LAB — MRSA PCR SCREENING: MRSA by PCR: NEGATIVE

## 2012-04-02 IMAGING — CR DG CHEST 2V
2 series · 2 of 2 positions shown · non-contrast
Comparison: One-view chest [DATE].

CLINICAL DATA: Chest pain.  Left arm pain and diaphoresis.

CHEST - 2 VIEW

[w chest pa]
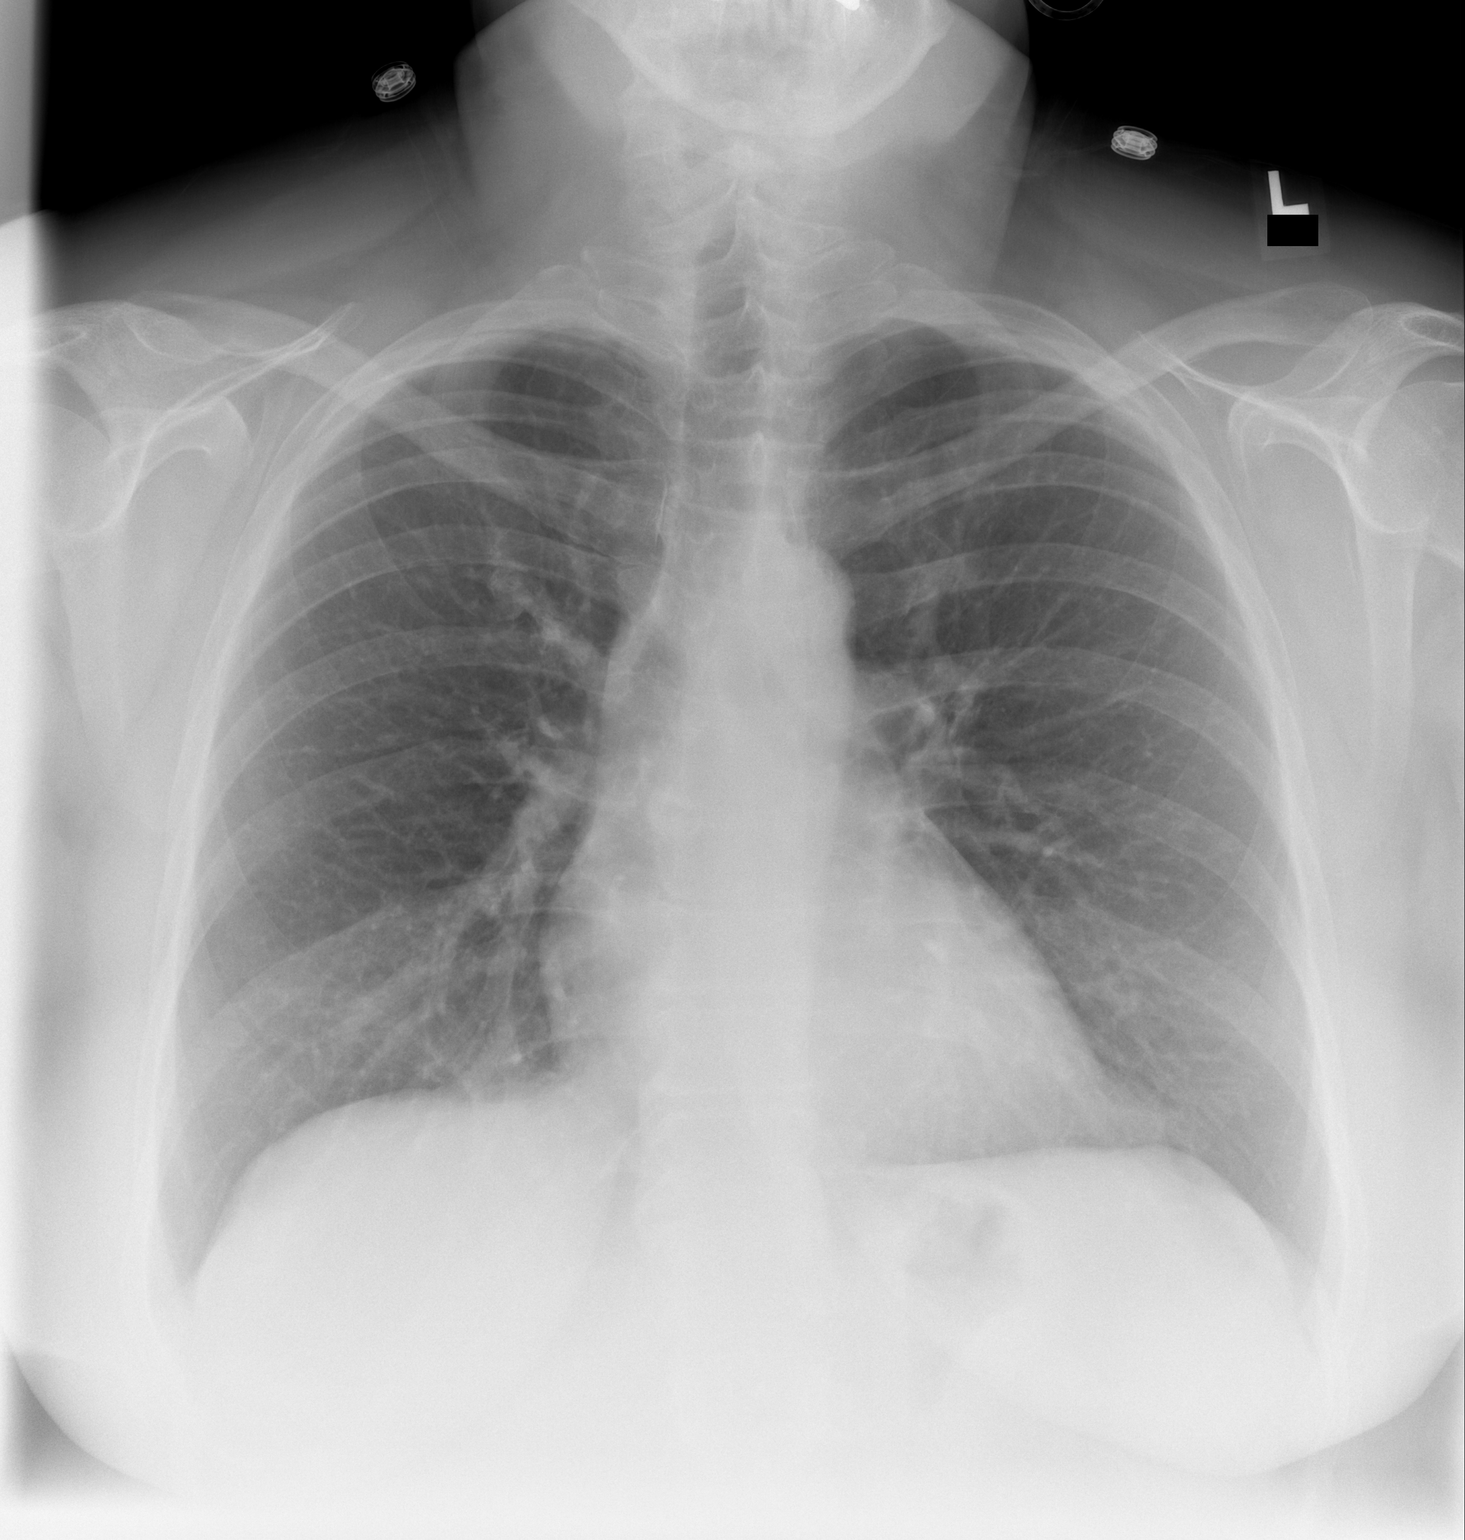

[w chest lat]
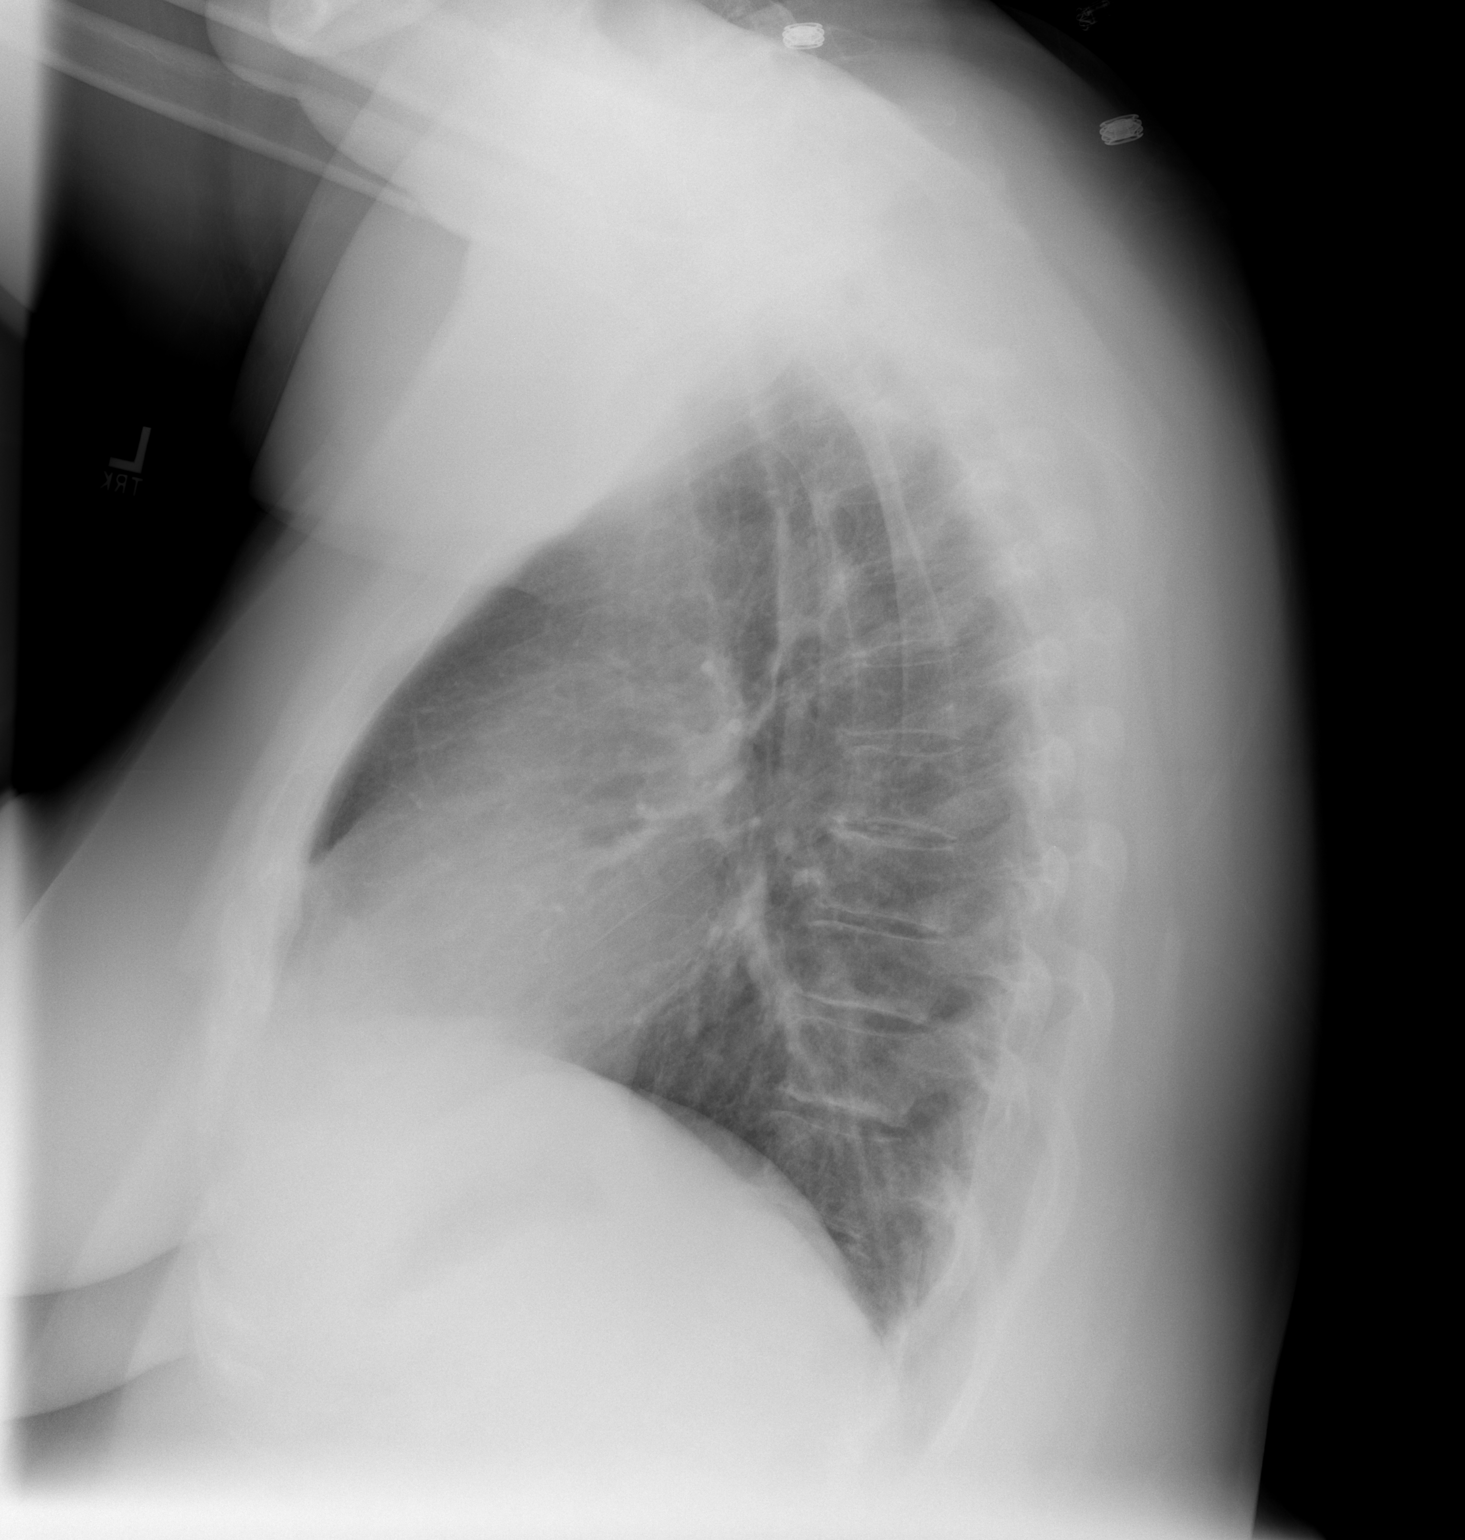

[2 of 2 positions shown; findings below may reference images not displayed]

FINDINGS: The heart size is normal.  The lungs are clear.  The
visualized soft tissues and bony thorax are unremarkable.
IMPRESSION: Negative two-view chest.

## 2012-04-02 MED ORDER — ASPIRIN 81 MG PO CHEW
324.0000 mg | CHEWABLE_TABLET | Freq: Once | ORAL | Status: AC
  Administered 2012-04-02: 324 mg via ORAL
  Filled 2012-04-02: qty 4

## 2012-04-02 MED ORDER — SODIUM CHLORIDE 0.9 % IJ SOLN
3.0000 mL | Freq: Two times a day (BID) | INTRAMUSCULAR | Status: DC
Start: 2012-04-02 — End: 2012-04-03
  Administered 2012-04-02 – 2012-04-03 (×2): 3 mL via INTRAVENOUS

## 2012-04-02 MED ORDER — INFLUENZA VIRUS VACC SPLIT PF IM SUSP
0.5000 mL | INTRAMUSCULAR | Status: AC
  Administered 2012-04-03: 0.5 mL via INTRAMUSCULAR
  Filled 2012-04-02: qty 0.5

## 2012-04-02 MED ORDER — ASPIRIN 81 MG PO CHEW
324.0000 mg | CHEWABLE_TABLET | ORAL | Status: AC
  Administered 2012-04-03: 324 mg via ORAL
  Filled 2012-04-02: qty 4

## 2012-04-02 MED ORDER — HEPARIN (PORCINE) IN NACL 100-0.45 UNIT/ML-% IJ SOLN
1450.0000 [IU]/h | INTRAMUSCULAR | Status: DC
  Administered 2012-04-02: 1300 [IU]/h via INTRAVENOUS
  Filled 2012-04-02 (×3): qty 250

## 2012-04-02 MED ORDER — NITROGLYCERIN 0.4 MG SL SUBL
0.4000 mg | SUBLINGUAL_TABLET | SUBLINGUAL | Status: DC | PRN
  Administered 2012-04-03: 0.4 mg via SUBLINGUAL
  Filled 2012-04-02: qty 25

## 2012-04-02 MED ORDER — ACETAMINOPHEN 500 MG PO TABS
1000.0000 mg | ORAL_TABLET | Freq: Four times a day (QID) | ORAL | Status: DC | PRN
  Administered 2012-04-02 – 2012-04-03 (×3): 1000 mg via ORAL
  Filled 2012-04-02 (×6): qty 2

## 2012-04-02 MED ORDER — SODIUM CHLORIDE 0.9 % IV SOLN: 250.0000 mL | INTRAVENOUS | Status: DC | PRN

## 2012-04-02 MED ORDER — ALPRAZOLAM 0.25 MG PO TABS: 0.2500 mg | ORAL_TABLET | Freq: Two times a day (BID) | ORAL | Status: DC | PRN

## 2012-04-02 MED ORDER — ACETAMINOPHEN 325 MG PO TABS: 650.0000 mg | ORAL_TABLET | ORAL | Status: DC | PRN

## 2012-04-02 MED ORDER — ASPIRIN EC 81 MG PO TBEC: 81.0000 mg | DELAYED_RELEASE_TABLET | Freq: Every day | ORAL | Status: DC

## 2012-04-02 MED ORDER — SODIUM CHLORIDE 0.9 % IJ SOLN: 3.0000 mL | INTRAMUSCULAR | Status: DC | PRN

## 2012-04-02 MED ORDER — SODIUM CHLORIDE 0.9 % IV SOLN: 1.0000 mL/kg/h | INTRAVENOUS | Status: DC

## 2012-04-02 MED ORDER — NITROGLYCERIN 2 % TD OINT
1.0000 [in_us] | TOPICAL_OINTMENT | Freq: Three times a day (TID) | TRANSDERMAL | Status: DC
  Administered 2012-04-02 – 2012-04-03 (×3): 1 [in_us] via TOPICAL
  Filled 2012-04-02: qty 30
  Filled 2012-04-02: qty 1

## 2012-04-02 MED ORDER — METOPROLOL TARTRATE 50 MG PO TABS
50.0000 mg | ORAL_TABLET | Freq: Two times a day (BID) | ORAL | Status: DC
  Administered 2012-04-02 – 2012-04-03 (×2): 50 mg via ORAL
  Filled 2012-04-02 (×4): qty 1

## 2012-04-02 MED ORDER — ATORVASTATIN CALCIUM 40 MG PO TABS
40.0000 mg | ORAL_TABLET | Freq: Every day | ORAL | Status: DC
  Administered 2012-04-02 – 2012-04-03 (×2): 40 mg via ORAL
  Filled 2012-04-02 (×2): qty 1

## 2012-04-02 MED ORDER — ONDANSETRON HCL 4 MG/2ML IJ SOLN: 4.0000 mg | Freq: Four times a day (QID) | INTRAMUSCULAR | Status: DC | PRN

## 2012-04-02 MED ORDER — HEPARIN BOLUS VIA INFUSION
4000.0000 [IU] | Freq: Once | INTRAVENOUS | Status: AC
  Administered 2012-04-02: 4000 [IU] via INTRAVENOUS
  Filled 2012-04-02: qty 4000

## 2012-04-02 MED ORDER — PRASUGREL HCL 10 MG PO TABS
10.0000 mg | ORAL_TABLET | Freq: Every day | ORAL | Status: DC
  Administered 2012-04-03: 10 mg via ORAL
  Filled 2012-04-02: qty 1

## 2012-04-02 MED ORDER — PANTOPRAZOLE SODIUM 40 MG PO TBEC
40.0000 mg | DELAYED_RELEASE_TABLET | Freq: Two times a day (BID) | ORAL | Status: DC
  Administered 2012-04-02 – 2012-04-03 (×2): 40 mg via ORAL
  Filled 2012-04-02 (×2): qty 1

## 2012-04-02 MED ORDER — ZOLPIDEM TARTRATE 5 MG PO TABS: 5.0000 mg | ORAL_TABLET | Freq: Every evening | ORAL | Status: DC | PRN

## 2012-04-02 MED ORDER — ASPIRIN 81 MG PO TABS: 81.0000 mg | ORAL_TABLET | Freq: Every day | ORAL | Status: DC

## 2012-04-02 MED ORDER — SODIUM CHLORIDE 0.9 % IV SOLN
INTRAVENOUS | Status: DC
  Administered 2012-04-02: 50 mL/h via INTRAVENOUS

## 2012-04-02 MED ORDER — ENALAPRIL MALEATE 5 MG PO TABS
5.0000 mg | ORAL_TABLET | Freq: Every day | ORAL | Status: DC
  Administered 2012-04-03: 5 mg via ORAL
  Filled 2012-04-02: qty 1

## 2012-04-02 MED ORDER — DIAZEPAM 5 MG PO TABS
5.0000 mg | ORAL_TABLET | ORAL | Status: AC
  Administered 2012-04-03: 5 mg via ORAL
  Filled 2012-04-02: qty 1

## 2012-04-02 NOTE — H&P (Signed)
[name] is an 51 y.o. female.   Chief Complaint: chest pain HPI: Patient with history of NSTEMI, 3 promus stents placement 06/2010 to LCX, for 90 % stenosis and prior arthrectomy in 2004 to LAD and diagonal -- presents with complaint of left-sided chest pain described as "clutching" with radiation to her left upper arm. Patient states that's this feels different than her previous heart attack which was a pressure. It is associated with mild shortness of breath and nausea. No vomiting. No palpitations. Patient also endorses 2-3 days of "feeling bad" with nausea. No vomiting. Patient took a nitroglycerin prior to arrival which improved her pain.    On my exam she does describe the discomfort as pressure.  She has also been DOE recently without orthopnea.   NTG with relief of discomfort but now mild chest pressure also numbness and tingling in both hands.  EKG without acute changes, initial troponin negative.   She has a strong family history of coronary artery disease in both parents and additionally a 19 year old brother died suddenly with an acute MI.  She has been under a lot of stress recently her mother has ovarian cancer undergoing chemotherapy, her father  does have a history of coronary disease as well as her mother and the patient has work stress.  Dr. Alanda Amass  prescribed BuSpar for the patient but she became very dizzy on the BuSpar and felt fuzzy in her thinking so she stopped the medication.    On her last visit with Dr. Alanda Amass her blood pressure was elevated at 160/100 and he increased her medications by adding enalapril.  She recently underwent 2-D echo 03/18/2012 EF was greater than 55% there was normal LV wall thickness can't trace mitral regurg and trace tricuspid regurg and a right ventricular systolic pressure was normal.   Past Medical History  Diagnosis Date  . Hypertension   . Coronary artery disease   . Myocardial infarct   . Unstable angina 04/02/2012  . CAD  (coronary artery disease), premature-cutting balloon athrectomy LAD 2004, 3 DES-Promus to Prox LCX 2012   04/02/2012  . Morbid obesity with BMI of 40.0-44.9, adult 04/02/2012  . Hyperlipidemia LDL goal < 70 04/02/2012    Past Surgical History  Procedure Date  . Abdominal hysterectomy   . Ankle surgery   . Coronary angioplasty     Family History  Problem Relation Age of Onset  . Coronary artery disease Mother   . Cancer - Ovarian Mother   . Coronary artery disease Father   . Heart attack Brother    Social History:  reports that she quit smoking about 21 years ago. She has never used smokeless tobacco. She reports that she does not drink alcohol or use illicit drugs.  Married with 2 children ages 95 and 74.  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  Does not exercise.  Allergies:  Allergies  Allergen Reactions  . Penicillins     Outpatient medications: Lopressor 25 mg twice a day Potassium 10 mg daily Aspirin 81 mg daily Enalapril 5 mg daily Lipitor 40 mg daily Nitroglycerin sublingual when necessary Omeprazole 20 mg daily  Results for orders placed during the hospital encounter of 04/02/12 (from the past 48 hour(s))  BASIC METABOLIC PANEL     Status: Abnormal   Collection Time   04/02/12  8:45 AM      Component Value Range Comment   Sodium 137  135 - 145 mEq/L    Potassium 4.7  3.5 -  5.1 mEq/L    Chloride 100  96 - 112 mEq/L    CO2 26  19 - 32 mEq/L    Glucose, Bld 128 (*) 70 - 99 mg/dL    BUN 10  6 - 23 mg/dL    Creatinine, Ser 4.09  0.50 - 1.10 mg/dL    Calcium 9.7  8.4 - 81.1 mg/dL    GFR calc non Af Amer >90  >90 mL/min    GFR calc Af Amer >90  >90 mL/min   CBC     Status: Abnormal   Collection Time   04/02/12  8:45 AM      Component Value Range Comment   WBC 9.9  4.0 - 10.5 K/uL    RBC 5.34 (*) 3.87 - 5.11 MIL/uL    Hemoglobin 14.5  12.0 - 15.0 g/dL    HCT 91.4  78.2 - 95.6 %    MCV 80.9  78.0 - 100.0 fL    MCH 27.2  26.0 - 34.0 pg    MCHC 33.6  30.0 - 36.0  g/dL    RDW 21.3  08.6 - 57.8 %    Platelets 242  150 - 400 K/uL   POCT I-STAT TROPONIN I     Status: Normal   Collection Time   04/02/12  8:58 AM      Component Value Range Comment   Troponin i, poc 0.01  0.00 - 0.08 ng/mL    Comment 3             Dg Chest 2 View  04/02/2012  *RADIOLOGY REPORT*  Clinical Data: Chest pain.  Left arm pain and diaphoresis.  CHEST - 2 VIEW  Comparison: One-view chest 07/04/2010.  Findings: The heart size is normal.  The lungs are clear.  The visualized soft tissues and bony thorax are unremarkable.  IMPRESSION: Negative two-view chest.   Original Report Authenticated By: Jamesetta Orleans. MATTERN, M.D.     ROS: General: No recent colds or fevers, dyspnea on exertion Skin:No rashes no ulcers HEENT:No blurred vision or double vision IO:NGEXB pain is described MWU:[id number] on exertion recently GI:No diarrhea constipation or melena GU:No hematuria no dysuria MS:No joint pain Neuro:No syncope or lightheadedness Endo:No diabetes or thyroid disease Does sound as the patient has sleep apnea and was to be scheduled for sleep study through our office  no Recent long trips  Blood pressure 130/69, pulse 79, temperature 98.6 F (37 C), temperature source Oral, resp. rate 19, SpO2 97.00%. PE: General:Alert and oriented white female anxious currently in no acute distress Skin:Warm and dry, brisk capillary refill HEENT:Normocephalic, sclerae clear Neck:Supple no JVD no carotid bruits Heart:S1-S2 regular rate Without murmur gallop rub or click Lungs:Clear without rales rhonchi or wheezes UUV:OZDGU, soft nontender positive bowel sounds do not palpate liver spleen or masses Ext:No lower extremity edema No calf tenderness pedal pulses Are intact Neuro:Alert and oriented x3, follows commands, moves all extremities    Assessment/Plan Principal Problem:  *Unstable angina Active Problems:  CAD (coronary artery disease), premature-cutting balloon athrectomy LAD 2004, 3  DES-Promus to Prox LCX 2012    Morbid obesity with BMI of 40.0-44.9, adult  Hyperlipidemia LDL goal < 70  PLAN: Will admit for IV heparin nitroglycerin paste and though if her pain returns and or does not completely resolve we'll change to IV nitroglycerin. Plan will be for cardiac catheterization versus Myoview tomorrow.  M.D. To evaluate  INGOLD,LAURA R 04/02/2012, 11:40 AM  Agree with note written by Nada Boozer RNP  Known CAD s/p remote PCI LAD 2004 anf of LCX by me 2012 (done radially). + CRF. Sx C/W [place]. No EKG changes. Enz neg.Exam benign.  Plan admit to stepdown, IV hep. Cath radially tomorrow. Pt agreeable.  Runell Gess 04/02/2012 1:58 PM

## 2012-04-02 NOTE — ED Notes (Signed)
Pt had mi in 2012 and presents today with chest pain to left upper chest and into left shoulder.  Pt states had busy day on Saturday and on Sunday felt nauseated and sob.  Monday had headache and nausea. Pt states restless nite got ready for work and felt sob and developed chest pain.  Pt reports tingling to bilateral fingers

## 2012-04-02 NOTE — Progress Notes (Signed)
ANTICOAGULATION CONSULT NOTE - Initial Consult  Pharmacy Consult for Heparin Indication: chest pain/ACS/unstable angina  Allergies  Allergen Reactions  . Penicillins     Patient Measurements: Height: 5' 6.5" (168.9 cm) Weight: 271 lb (122.925 kg) IBW/kg (Calculated) : 60.45  Heparin Dosing Weight: 91 kg  Vital Signs: Temp: 98.6 F (37 C) (10/22 0829) Temp src: Oral (10/22 0829) BP: 138/59 mmHg (10/22 1343) Pulse Rate: 78  (10/22 1343)  Labs:  Basename 04/02/12 1231 04/02/12 0845  HGB -- 14.5  HCT -- 43.2  PLT -- 242  APTT -- --  LABPROT -- --  INR -- --  HEPARINUNFRC -- --  CREATININE -- 0.76  CKTOTAL 98 --  CKMB 1.6 --  TROPONINI <0.30 --    Estimated Creatinine Clearance: 111 ml/min (by C-G formula based on Cr of 0.76).   Medical History: Past Medical History  Diagnosis Date  . Hypertension   . Coronary artery disease   . Myocardial infarct   . Unstable angina 04/02/2012  . CAD (coronary artery disease), premature-cutting balloon athrectomy LAD 2004, 3 DES-Promus to Prox LCX 2012   04/02/2012  . Morbid obesity with BMI of 40.0-44.9, adult 04/02/2012  . Hyperlipidemia LDL goal < 70 04/02/2012   Assessment:   51 yr old woman with known CAD, 3 prior stents (last Jan. 2012), being admitted with chest pain.  Cath planned for 10/23, radial approach.   She does not recall any problems while on heparin in the past.  Goal of Therapy:  Heparin level 0.3-0.7 units/ml Monitor platelets by anticoagulation protocol: Yes   Plan:   Heparin 4000 units IV bolus, then infusion at 1300 units/hr.  Heparin level ~6 hrs after drip begins.  Daily heparin level and CBC while on heparin.  Dennie Fetters, RPh Pager: [phone] 04/02/2012,2:18 PM

## 2012-04-02 NOTE — Progress Notes (Signed)
ANTICOAGULATION CONSULT NOTE - Follow Up  Pharmacy Consult for Heparin Indication: chest pain/ACS/unstable angina  Allergies  Allergen Reactions  . Penicillins    Patient Measurements: Height: 5\' 6"  (167.6 cm) Weight: 269 lb 10 oz (122.3 kg) IBW/kg (Calculated) : 59.3  Heparin Dosing Weight: 91 kg  Vital Signs: Temp: 98.2 F (36.8 C) (10/22 1932) Temp src: Oral (10/22 1932) BP: 106/83 mmHg (10/22 2155) Pulse Rate: 90  (10/22 2155)  Labs:  Basename 04/02/12 2135 04/02/12 1843 04/02/12 1527 04/02/12 1231 04/02/12 0845  HGB -- -- -- -- 14.5  HCT -- -- -- -- 43.2  PLT -- -- -- -- 242  APTT -- -- -- -- --  LABPROT -- -- 14.6 -- --  INR -- -- 1.16 -- --  HEPARINUNFRC 0.15* -- -- -- --  CREATININE -- -- -- -- 0.76  CKTOTAL -- -- -- 98 --  CKMB -- -- -- 1.6 --  TROPONINI -- <0.30 -- <0.30 --   Estimated Creatinine Clearance: 109.7 ml/min (by C-G formula based on Cr of 0.76).  Medical History: Past Medical History  Diagnosis Date  . Hypertension   . Coronary artery disease   . Myocardial infarct   . Unstable angina 04/02/2012  . CAD (coronary artery disease), premature-cutting balloon athrectomy LAD 2004, 3 DES-Promus to Prox LCX 2012   04/02/2012  . Morbid obesity with BMI of 40.0-44.9, adult 04/02/2012  . Hyperlipidemia LDL goal < 70 04/02/2012  . Shortness of breath   . Pneumonia     hx of in 03/2010   Assessment:   51 yr old woman with known CAD, 3 prior stents (last Jan. 2012), being admitted with chest pain.  Cath planned for 10/23, radial approach.   She does not recall any problems while on heparin in the past.  Her initial heparin level result is 0.15 which is below desired therapeutic goal range.  I spoke with the nurse and she states there have been no issues regarding her IV infusion.  No bleeding complications noted either.  Goal of Therapy:  Heparin level 0.3-0.7 units/ml Monitor platelets by anticoagulation protocol: Yes   Plan:   Will increase IV  Heparin to 1450 units/hr.   F/U heparin level in the morning since she will be going to the cath lab.  Daily heparin level and CBC while on heparin.  Nadara Mustard, PharmD., MS Clinical Pharmacist Pager:  [phone] Thank you for allowing pharmacy to be part of this patients care team. 04/02/2012,10:31 PM

## 2012-04-02 NOTE — ED Provider Notes (Signed)
History     CSN: [id number]  Arrival date & time 04/02/12  0820   First MD Initiated Contact with Patient 04/02/12 [phone]      Chief Complaint  Patient presents with  . Chest Pain    (Consider location/radiation/quality/duration/timing/severity/associated sxs/prior treatment) HPI Comments: Patient with history of MI, stent placement 06/2010 -- presents with complaint of left-sided chest pain described as "clutching" with radiation to her left upper arm. Patient states that's this feels different than her previous heart attack which was a pressure. It is associated with mild shortness of breath and nausea. No vomiting. No palpitations. Patient also endorses 2-3 days of "feeling bad" with nausea. No vomiting. Patient took a nitroglycerin prior to arrival which improved her pain. Patient states that she has been compliant with prasugrel. Sensation almost completely resolved arrival. Patient took 81 mg of aspirin this morning. Onset gradual. Course is improving. Nothing makes symptoms worse.   The history is provided by the patient.    Past Medical History  Diagnosis Date  . Hypertension   . Coronary artery disease   . Myocardial infarct     Past Surgical History  Procedure Date  . Abdominal hysterectomy   . Ankle surgery     No family history on file.  History  Substance Use Topics  . Smoking status: Former Games developer  . Smokeless tobacco: Not on file  . Alcohol Use: No    OB History    Grav Para Term Preterm Abortions TAB SAB Ect Mult Living                  Review of Systems  Constitutional: Negative for fever and diaphoresis.  HENT: Negative for neck pain.   Eyes: Negative for redness.  Respiratory: Positive for shortness of breath. Negative for cough.   Cardiovascular: Positive for chest pain. Negative for palpitations and leg swelling.  Gastrointestinal: Positive for nausea. Negative for vomiting and abdominal pain.  Genitourinary: Negative for dysuria.    Musculoskeletal: Negative for back pain.  Skin: Negative for rash.  Neurological: Negative for syncope and light-headedness.    Allergies  Penicillins  Home Medications   Current Outpatient Rx  Name Route Sig Dispense Refill  . ACETAMINOPHEN 500 MG PO TABS Oral Take 1,000 mg by mouth every 6 (six) hours as needed. For pain    . ASPIRIN 81 MG PO TABS Oral Take 81 mg by mouth daily.    . ATORVASTATIN CALCIUM 40 MG PO TABS Oral Take 40 mg by mouth daily.    . ENALAPRIL MALEATE 5 MG PO TABS Oral Take 5 mg by mouth daily.    Marland Kitchen METOPROLOL TARTRATE 50 MG PO TABS Oral Take 50 mg by mouth 2 (two) times daily.    Marland Kitchen NITROGLYCERIN 0.4 MG SL SUBL Sublingual Place 0.4 mg under the tongue every 5 (five) minutes as needed. For chest pain    . OMEPRAZOLE 40 MG PO CPDR Oral Take 40 mg by mouth daily.    Marland Kitchen PRASUGREL HCL 10 MG PO TABS Oral Take 10 mg by mouth daily.      BP 145/70  Pulse 97  Temp 98.6 F (37 C) (Oral)  Resp 18  SpO2 97%  Physical Exam  Nursing note and vitals reviewed. Constitutional: She appears well-developed and well-nourished.  HENT:  Head: Normocephalic and atraumatic.  Mouth/Throat: Mucous membranes are normal. Mucous membranes are not dry.  Eyes: Conjunctivae normal are normal.  Neck: Trachea normal and normal range of motion. Neck supple.  Normal carotid pulses and no JVD present. No muscular tenderness present. Carotid bruit is not present. No tracheal deviation present.  Cardiovascular: Normal rate, regular rhythm, S1 normal, S2 normal, normal heart sounds and intact distal pulses.  Exam reveals no decreased pulses.   No murmur heard. Pulmonary/Chest: Effort normal. No respiratory distress. She has no wheezes. She exhibits no tenderness.  Abdominal: Soft. Normal aorta and bowel sounds are normal. There is no tenderness. There is no rebound and no guarding.  Musculoskeletal: Normal range of motion.  Neurological: She is alert.  Skin: Skin is warm and dry. She is not  diaphoretic. No cyanosis. No pallor.  Psychiatric: She has a normal mood and affect.    ED Course  Procedures (including critical care time)  Labs Reviewed  BASIC METABOLIC PANEL - Abnormal; Notable for the following:    Glucose, Bld 128 (*)     All other components within normal limits  CBC - Abnormal; Notable for the following:    RBC 5.34 (*)     All other components within normal limits  POCT I-STAT TROPONIN I  TROPONIN I  TROPONIN I  TROPONIN I  CK TOTAL AND CKMB  HEPATIC FUNCTION PANEL  MAGNESIUM  TSH  PROTIME-INR  HEMOGLOBIN A1C   Dg Chest 2 View  04/02/2012  *RADIOLOGY REPORT*  Clinical Data: Chest pain.  Left arm pain and diaphoresis.  CHEST - 2 VIEW  Comparison: One-view chest 07/04/2010.  Findings: The heart size is normal.  The lungs are clear.  The visualized soft tissues and bony thorax are unremarkable.  IMPRESSION: Negative two-view chest.   Original Report Authenticated By: Jamesetta Orleans. MATTERN, M.D.      1. Chest pain     8:56 AM Patient seen and examined. EKG reviewed. ASA ordered. Work-up initiated. Medications ordered.   Vital signs reviewed and are as follows: Filed Vitals:   04/02/12 0829  BP: 145/70  Pulse: 97  Temp: 98.6 F (37 C)  Resp: 18    Date: 04/02/2012  Rate: 104  Rhythm: sinus tachycardia  QRS Axis: normal  Intervals: normal  ST/T Wave abnormalities: normal  Conduction Disutrbances:none  Narrative Interpretation:   Old EKG Reviewed: unchanged from 09/29/2010  10:37 AM Patient updated on results. D/w Dr. Lorenso Courier. Will call [facility] for consult.   10:43 AM [facility] to see.   12:49 PM [facility] admitting.     MDM  Admitted for CP.         Renne Crigler, [place] 04/02/12 1255

## 2012-04-02 NOTE — ED Notes (Signed)
PT took one nitro and made pain better and states she took aspirin this am

## 2012-04-03 ENCOUNTER — Encounter (HOSPITAL_COMMUNITY)
Admission: EM | Disposition: A | Discharge status: Home / Self Care | Payer: Self-pay | Source: Home / Self Care | Attending: Cardiovascular Disease

## 2012-04-03 ENCOUNTER — Encounter (HOSPITAL_COMMUNITY): Payer: Self-pay | Admitting: Cardiology

## 2012-04-03 DIAGNOSIS — I10 Essential (primary) hypertension: Secondary | ICD-10-CM | POA: Diagnosis present

## 2012-04-03 DIAGNOSIS — E8881 Metabolic syndrome: Secondary | ICD-10-CM

## 2012-04-03 DIAGNOSIS — Z8249 Family history of ischemic heart disease and other diseases of the circulatory system: Secondary | ICD-10-CM

## 2012-04-03 HISTORY — PX: LEFT HEART CATHETERIZATION WITH CORONARY ANGIOGRAM: SHX5451

## 2012-04-03 HISTORY — DX: Metabolic syndrome: E88.81

## 2012-04-03 HISTORY — DX: Metabolic syndrome: E88.810

## 2012-04-03 LAB — BASIC METABOLIC PANEL
CO2: 27 mEq/L (ref 19–32)
Calcium: 9.2 mg/dL (ref 8.4–10.5)
GFR calc non Af Amer: >90 mL/min (ref >90)
Glucose, Bld: 115 mg/dL — ABNORMAL HIGH (ref 70–99)
Potassium: 3.9 mEq/L (ref 3.5–5.1)
Sodium: 139 mEq/L (ref 135–145)

## 2012-04-03 LAB — LIPID PANEL
Cholesterol: 179 mg/dL (ref 0–200)
Triglycerides: 249 mg/dL — ABNORMAL HIGH (ref <150)
VLDL: 50 mg/dL — ABNORMAL HIGH (ref 0–40)

## 2012-04-03 LAB — CBC
Hemoglobin: 12.7 g/dL (ref 12.0–15.0)
MCHC: 32.6 g/dL (ref 30.0–36.0)
Platelets: 229 10*3/uL (ref 150–400)
RBC: 4.73 MIL/uL (ref 3.87–5.11)

## 2012-04-03 LAB — HEMOGLOBIN A1C: Hgb A1c MFr Bld: 6 % — ABNORMAL HIGH (ref <5.7)

## 2012-04-03 LAB — HEPARIN LEVEL (UNFRACTIONATED): Heparin Unfractionated: 0.25 IU/mL — ABNORMAL LOW (ref 0.30–0.70)

## 2012-04-03 SURGERY — LEFT HEART CATHETERIZATION WITH CORONARY ANGIOGRAM
Anesthesia: LOCAL

## 2012-04-03 MED ORDER — HEPARIN (PORCINE) IN NACL 100-0.45 UNIT/ML-% IJ SOLN
1600.0000 [IU]/h | INTRAMUSCULAR | Status: DC
  Filled 2012-04-03 (×2): qty 250

## 2012-04-03 MED ORDER — SODIUM CHLORIDE 0.9 % IV SOLN: INTRAVENOUS | Status: DC

## 2012-04-03 MED ORDER — NITROGLYCERIN 0.2 MG/ML ON CALL CATH LAB
INTRAVENOUS | Status: AC
  Filled 2012-04-03: qty 1

## 2012-04-03 MED ORDER — ONDANSETRON HCL 4 MG/2ML IJ SOLN: 4.0000 mg | Freq: Four times a day (QID) | INTRAMUSCULAR | Status: DC | PRN

## 2012-04-03 MED ORDER — FENTANYL CITRATE 0.05 MG/ML IJ SOLN
INTRAMUSCULAR | Status: AC
  Filled 2012-04-03: qty 2

## 2012-04-03 MED ORDER — ACETAMINOPHEN 325 MG PO TABS: 650.0000 mg | ORAL_TABLET | ORAL | Status: DC | PRN

## 2012-04-03 MED ORDER — LIDOCAINE HCL (PF) 1 % IJ SOLN
INTRAMUSCULAR | Status: AC
  Filled 2012-04-03: qty 30

## 2012-04-03 MED ORDER — MIDAZOLAM HCL 2 MG/2ML IJ SOLN
INTRAMUSCULAR | Status: AC
  Filled 2012-04-03: qty 2

## 2012-04-03 MED ORDER — DM-GUAIFENESIN ER 30-600 MG PO TB12
2.0000 | ORAL_TABLET | Freq: Two times a day (BID) | ORAL | Status: DC | PRN
  Filled 2012-04-03: qty 2

## 2012-04-03 MED ORDER — HEPARIN (PORCINE) IN NACL 2-0.9 UNIT/ML-% IJ SOLN
INTRAMUSCULAR | Status: AC
  Filled 2012-04-03: qty 1000

## 2012-04-03 NOTE — CV Procedure (Signed)
Cardiac Catheterization  [name], 52 y.o., female  Full note dictated; see diagram   DICTATION # [phone], [id number]  Ao: 139/86 LV:130/15/25  LM: nl LAD: no restenosis, patent LCX: widely patent stents, very small branch arising from stent with 60% narrowong ostially. RCA: 20% prox and distal stenosis, 50% in Ant RV marginal branch.  NL LV Fxn: EF 65%  Mynx closure. Tolerated well.  Lennette Bihari, MD, [facility] 04/03/2012 11:33 AM

## 2012-04-03 NOTE — Progress Notes (Signed)
Subjective: No further chest pain like on admit, now in throat-discomfort, she also developed a cough overnight NTG given for this without improvement Objective: Vital signs in last 24 hours: Temp:  [97.9 F (36.6 C)-98.6 F (37 C)] 97.9 F (36.6 C) (10/23 0715) Pulse Rate:  [69-100] 69  (10/23 0715) Resp:  [13-26] 16  (10/23 0715) BP: (90-152)/(49-89) 123/71 mmHg (10/23 0715) SpO2:  [97 %-100 %] 99 % (10/23 0715) Weight:  [122.3 kg (269 lb 10 oz)-123.6 kg (272 lb 7.8 oz)] 123.6 kg (272 lb 7.8 oz) (10/23 0500) Weight change:  Last BM Date: 04/02/12 Intake/Output from previous day: +1098 10/22 0701 - 10/23 0700 In: 1235.4 [I.V.:1235.4] Out: -  Intake/Output this shift:    PE: General:alert and oriented  Heart:S1S2 RRR Lungs:clear  Abd:+ BS, soft, non tender Ext:no edema    Lab Results:  Basename 04/03/12 0455 04/02/12 0845  WBC 11.3* 9.9  HGB 12.7 14.5  HCT 39.0 43.2  PLT 229 242   BMET  Basename 04/03/12 0455 04/02/12 0845  NA 139 137  K 3.9 4.7  CL 103 100  CO2 27 26  GLUCOSE 115* 128*  BUN 11 10  CREATININE 0.75 0.76  CALCIUM 9.2 9.7    Basename 04/03/12 0015 04/02/12 1843  TROPONINI <0.30 <0.30    Lab Results  Component Value Date   CHOL 179 04/03/2012   HDL 36* 04/03/2012   LDLCALC 93 04/03/2012   TRIG 249* 04/03/2012   CHOLHDL 5.0 04/03/2012   Lab Results  Component Value Date   HGBA1C 6.0* 04/02/2012     Lab Results  Component Value Date   TSH 0.696 04/02/2012    Hepatic Function Panel  Basename 04/02/12 1231  PROT 7.9  ALBUMIN 3.8  AST 20  ALT 17  ALKPHOS 105  BILITOT 0.3  BILIDIR <0.1  IBILI NOT CALCULATED    Basename 04/03/12 0455  CHOL 179     EKG: Orders placed during the hospital encounter of 04/02/12  . ED EKG  . ED EKG  . EKG 12-LEAD  . EKG 12-LEAD  . EKG 12-LEAD    Studies/Results: Dg Chest 2 View  04/02/2012  *RADIOLOGY REPORT*  Clinical Data: Chest pain.  Left arm pain and diaphoresis.  CHEST - 2 VIEW   Comparison: One-view chest 07/04/2010.  Findings: The heart size is normal.  The lungs are clear.  The visualized soft tissues and bony thorax are unremarkable.  IMPRESSION: Negative two-view chest.   Original Report Authenticated By: Jamesetta Orleans. MATTERN, M.D.     Medications: I have reviewed the patient's current medications.    Marland Kitchen aspirin  324 mg Oral Once  . aspirin  324 mg Oral Pre-Cath  . aspirin EC  81 mg Oral Daily  . atorvastatin  40 mg Oral Daily  . diazepam  5 mg Oral On Call  . enalapril  5 mg Oral Daily  . heparin  4,000 Units Intravenous Once  . influenza  inactive virus vaccine  0.5 mL Intramuscular Tomorrow-1000  . metoprolol  50 mg Oral BID  . nitroGLYCERIN  1 inch Topical Q8H  . pantoprazole  40 mg Oral BID AC  . prasugrel  10 mg Oral Daily  . sodium chloride  3 mL Intravenous Q12H  . DISCONTD: aspirin EC  81 mg Oral Daily  . DISCONTD: aspirin  81 mg Oral Daily   Assessment/Plan: Principal Problem:  *Unstable angina Active Problems:  CAD (coronary artery disease), premature: cutting balloon athrectomy LAD 2004, 3 DES-Promus  to Prox LCX 2012    Morbid obesity with BMI of 40.0-44.9, adult  Hyperlipidemia LDL goal < 70  Metabolic syndrome  PLAN: negative MI, for cardiac cath today. OSA for sleep study as outpatient Metabolic syndrome, HgBA1c is 6.0, needs to loose wt, exercise.  Very important is this pt with premature CAD.   Cough non productive will add musinex.  LOS: 1 day   [name],[name] 04/03/2012, 8:18 AM    Patient seen and examined. Agree with assessment and plan. No further chest pain since admision. RCG without acute changes. As per Dr. Allyson Sabal, plan cath with possible PCI today.   Lennette Bihari, MD, [facility] 04/03/2012 8:47 AM

## 2012-04-03 NOTE — ED Provider Notes (Signed)
Medical screening examination/treatment/procedure(s) were performed by non-physician practitioner and as supervising physician I was immediately available for consultation/collaboration.  Jermani Eberlein T Monica Codd, MD 04/03/12 1656 

## 2012-04-03 NOTE — Discharge Summary (Signed)
Patient ID: KODA BLEE,  MRN: [id number], DOB/AGE: 12/11/1960 51 y.o.  Admit date: 04/02/2012 Discharge date: 04/03/2012  Primary Care Provider: Dr Michelle Nasuti Primary Cardiologist: Dr R.Alanda Amass  Discharge Diagnoses Principal Problem:  *Unstable angina Active Problems:  CAD (coronary artery disease), premature;  cutting balloon athrectomy LAD 2004, 3 DES-Promus to Prox LCX 2012    HTN (hypertension)  Morbid obesity with BMI of 40.0-44.9, adult  Hyperlipidemia LDL goal < 70  Metabolic syndrome  Family history of coronary artery disease    Procedures: cardiac cath 04/02/12   Hospital Course:  51 y/o female with a history CAD at a young age, S/P LAD and Dx PCI in 2004. Cath in 2008 showed patent LAD/Dx with 50% CFX. She was admitted with chest pain consistent with [place] in the setting of some situational stress at home secondary to family illness. Cath done 04/02/12 revealed no ISR in the LAD or CFX. Her EF was 65%. Troponins were negative. There was a 50% RV marginal branch narrowing. The plan is for continued medical Rx.  Discharge Vitals:  Blood pressure 119/69, pulse 64, temperature 97.4 F (36.3 C), temperature source Oral, resp. rate 7, height 5\' 6"  (1.676 m), weight 123.6 kg (272 lb 7.8 oz), SpO2 98.00%.    Labs: Results for orders placed during the hospital encounter of 04/02/12 (from the past 48 hour(s))  BASIC METABOLIC PANEL     Status: Abnormal   Collection Time   04/02/12  8:45 AM      Component Value Range Comment   Sodium 137  135 - 145 mEq/L    Potassium 4.7  3.5 - 5.1 mEq/L    Chloride 100  96 - 112 mEq/L    CO2 26  19 - 32 mEq/L    Glucose, Bld 128 (*) 70 - 99 mg/dL    BUN 10  6 - 23 mg/dL    Creatinine, Ser 4.09  0.50 - 1.10 mg/dL    Calcium 9.7  8.4 - 81.1 mg/dL    GFR calc non Af Amer >90  >90 mL/min    GFR calc Af Amer >90  >90 mL/min   CBC     Status: Abnormal   Collection Time   04/02/12  8:45 AM      Component Value Range Comment   WBC 9.9   4.0 - 10.5 K/uL    RBC 5.34 (*) 3.87 - 5.11 MIL/uL    Hemoglobin 14.5  12.0 - 15.0 g/dL    HCT 91.4  78.2 - 95.6 %    MCV 80.9  78.0 - 100.0 fL    MCH 27.2  26.0 - 34.0 pg    MCHC 33.6  30.0 - 36.0 g/dL    RDW 21.3  08.6 - 57.8 %    Platelets 242  150 - 400 K/uL   POCT I-STAT TROPONIN I     Status: Normal   Collection Time   04/02/12  8:58 AM      Component Value Range Comment   Troponin i, poc 0.01  0.00 - 0.08 ng/mL    Comment 3            TROPONIN I     Status: Normal   Collection Time   04/02/12 12:31 PM      Component Value Range Comment   Troponin I <0.30  <0.30 ng/mL   CK TOTAL AND CKMB     Status: Normal   Collection Time   04/02/12 12:31 PM  Component Value Range Comment   Total CK 98  7 - 177 U/L    CK, MB 1.6  0.3 - 4.0 ng/mL    Relative Index RELATIVE INDEX IS INVALID  0.0 - 2.5   HEPATIC FUNCTION PANEL     Status: Normal   Collection Time   04/02/12 12:31 PM      Component Value Range Comment   Total Protein 7.9  6.0 - 8.3 g/dL    Albumin 3.8  3.5 - 5.2 g/dL    AST 20  0 - 37 U/L    ALT 17  0 - 35 U/L    Alkaline Phosphatase 105  39 - 117 U/L    Total Bilirubin 0.3  0.3 - 1.2 mg/dL    Bilirubin, Direct <0.9  0.0 - 0.3 mg/dL    Indirect Bilirubin NOT CALCULATED  0.3 - 0.9 mg/dL   MAGNESIUM     Status: Normal   Collection Time   04/02/12 12:31 PM      Component Value Range Comment   Magnesium 2.1  1.5 - 2.5 mg/dL   TSH     Status: Normal   Collection Time   04/02/12 12:31 PM      Component Value Range Comment   TSH 0.696  0.350 - 4.500 uIU/mL   MRSA PCR SCREENING     Status: Normal   Collection Time   04/02/12  3:10 PM      Component Value Range Comment   MRSA by PCR NEGATIVE  NEGATIVE   PROTIME-INR     Status: Normal   Collection Time   04/02/12  3:27 PM      Component Value Range Comment   Prothrombin Time 14.6  11.6 - 15.2 seconds    INR 1.16  0.00 - 1.49   HEMOGLOBIN A1C     Status: Abnormal   Collection Time   04/02/12  3:27 PM       Component Value Range Comment   Hemoglobin A1C 6.0 (*) <5.7 %    Mean Plasma Glucose 126 (*) <117 mg/dL   TROPONIN I     Status: Normal   Collection Time   04/02/12  6:43 PM      Component Value Range Comment   Troponin I <0.30  <0.30 ng/mL   PLATELET INHIBITION P2Y12     Status: Normal   Collection Time   04/02/12  6:43 PM      Component Value Range Comment   Platelet Function  P2Y12 221  194 - 418 PRU   HEPARIN LEVEL (UNFRACTIONATED)     Status: Abnormal   Collection Time   04/02/12  9:35 PM      Component Value Range Comment   Heparin Unfractionated 0.15 (*) 0.30 - 0.70 IU/mL   TROPONIN I     Status: Normal   Collection Time   04/03/12 12:15 AM      Component Value Range Comment   Troponin I <0.30  <0.30 ng/mL   CBC     Status: Abnormal   Collection Time   04/03/12  4:55 AM      Component Value Range Comment   WBC 11.3 (*) 4.0 - 10.5 K/uL    RBC 4.73  3.87 - 5.11 MIL/uL    Hemoglobin 12.7  12.0 - 15.0 g/dL    HCT 81.1  91.4 - 78.2 %    MCV 82.5  78.0 - 100.0 fL    MCH 26.8  26.0 - 34.0 pg  MCHC 32.6  30.0 - 36.0 g/dL    RDW 16.1  09.6 - 04.5 %    Platelets 229  150 - 400 K/uL   BASIC METABOLIC PANEL     Status: Abnormal   Collection Time   04/03/12  4:55 AM      Component Value Range Comment   Sodium 139  135 - 145 mEq/L    Potassium 3.9  3.5 - 5.1 mEq/L DELTA CHECK NOTED   Chloride 103  96 - 112 mEq/L    CO2 27  19 - 32 mEq/L    Glucose, Bld 115 (*) 70 - 99 mg/dL    BUN 11  6 - 23 mg/dL    Creatinine, Ser 4.09  0.50 - 1.10 mg/dL    Calcium 9.2  8.4 - 81.1 mg/dL    GFR calc non Af Amer >90  >90 mL/min    GFR calc Af Amer >90  >90 mL/min   HEPARIN LEVEL (UNFRACTIONATED)     Status: Abnormal   Collection Time   04/03/12  4:55 AM      Component Value Range Comment   Heparin Unfractionated 0.25 (*) 0.30 - 0.70 IU/mL   LIPID PANEL     Status: Abnormal   Collection Time   04/03/12  4:55 AM      Component Value Range Comment   Cholesterol 179  0 - 200 mg/dL      Triglycerides 914 (*) <150 mg/dL    HDL 36 (*) >78 mg/dL    Total CHOL/HDL Ratio 5.0      VLDL 50 (*) 0 - 40 mg/dL    LDL Cholesterol 93  0 - 99 mg/dL     Disposition: Discharged in stable condition  Discharge Medications:    Medication List     As of 04/03/2012 12:40 PM    ASK your doctor about these medications         acetaminophen 500 MG tablet   Commonly known as: TYLENOL   Take 1,000 mg by mouth every 6 (six) hours as needed. For pain      aspirin 81 MG tablet   Take 81 mg by mouth daily.      atorvastatin 40 MG tablet   Commonly known as: LIPITOR   Take 40 mg by mouth daily.      enalapril 5 MG tablet   Commonly known as: VASOTEC   Take 5 mg by mouth daily.      metoprolol 50 MG tablet   Commonly known as: LOPRESSOR   Take 50 mg by mouth 2 (two) times daily.      nitroGLYCERIN 0.4 MG SL tablet   Commonly known as: NITROSTAT   Place 0.4 mg under the tongue every 5 (five) minutes as needed. For chest pain      omeprazole 40 MG capsule   Commonly known as: PRILOSEC   Take 40 mg by mouth daily.      prasugrel 10 MG Tabs   Commonly known as: EFFIENT   Take 10 mg by mouth daily.         Duration of Discharge Encounter: Greater than 30 minutes including physician time.  Jolene Provost PA-C 04/03/2012 12:40 PM

## 2012-04-03 NOTE — Progress Notes (Signed)
Pt c/o some chest pain. States she feels like she is getting a cold.  Nada Boozer at bedside and notified.  Gave one sl ntg with no relief.  mucinex ordered

## 2012-04-03 NOTE — Progress Notes (Signed)
ANTICOAGULATION CONSULT NOTE - Follow Up Consult  Pharmacy Consult for Heparin Indication: chest pain/ACS  Allergies  Allergen Reactions  . Penicillins     Patient Measurements: Heparin Dosing Weight: 91kg  Vital Signs: Temp: 97.9 F (36.6 C) (10/23 0715) Temp src: Oral (10/23 0715) BP: 123/71 mmHg (10/23 0715) Pulse Rate: 69  (10/23 0715)  Labs:  [name] 04/03/12 0455 04/03/12 0015 04/02/12 2135 04/02/12 1843 04/02/12 1527 04/02/12 1231 04/02/12 0845  HGB 12.7 -- -- -- -- -- 14.5  HCT 39.0 -- -- -- -- -- 43.2  PLT 229 -- -- -- -- -- 242  APTT -- -- -- -- -- -- --  LABPROT -- -- -- -- 14.6 -- --  INR -- -- -- -- 1.16 -- --  HEPARINUNFRC 0.25* -- 0.15* -- -- -- --  CREATININE 0.75 -- -- -- -- -- 0.76  CKTOTAL -- -- -- -- -- 98 --  CKMB -- -- -- -- -- 1.6 --  TROPONINI -- <0.30 -- <0.30 -- <0.30 --    Estimated Creatinine Clearance: 110.4 ml/min (by C-G formula based on Cr of 0.75).   Medications:  Heparin @ 1450 units/hr  Assessment: 51yof continues on heparin with a subtherapeutic heparin level despite rate increase last evening. Plan is for cath today. CBC stable. No bleeding noted.  Goal of Therapy:  Heparin level 0.3-0.7 units/ml Monitor platelets by anticoagulation protocol: Yes   Plan:  1) Increase heparin to 1600 units/hr 2) Follow up after cath  [name] 04/03/2012,8:20 AM

## 2012-04-04 NOTE — Cardiovascular Report (Signed)
[name], [name]               ACCOUNT NO.:  [id number]  MEDICAL RECORD NO.:  [id number]  LOCATION:  2916                         FACILITY:  [facility]  PHYSICIAN:  Nicki Guadalajara, M.D.     DATE OF BIRTH:  Nov 04, 1960  DATE OF PROCEDURE: DATE OF DISCHARGE:  04/03/2012                           CARDIAC CATHETERIZATION   INDICATIONS:  [name] is a 51 year old female who has history of premature coronary artery disease and had undergone initial cutting balloon arthrotomy of her LAD in 2004 and underwent DES PROMUS stenting to her proximal circumflex in 2012.  She has a history of metabolic syndrome, hyperlipidemia, and morbid obesity.  Recently she has been under increased stress.  She presented to [facility] yesterday with recurrent episodes of chest pain worrisome for unstable angina.  She was treated with IV heparin and nitroglycerin.  She is referred for repeat cardiac catheterization.  PROCEDURE:  After premedication with Versed 2 mg plus fentanyl 50 mcg, the patient was prepped and draped in usual fashion.  Her right femoral artery was punctured anteriorly and a 5-French sheath was inserted without difficulty.  Diagnostic cardiac catheterization was done utilizing 5-French Judkins 4 left and right coronary catheters.  A 5- French pigtail catheter was used for RAO ventriculography.  A Mynx closure device, 5-French was used for closure at the right femoral artery site.  Hemostasis was excellent.  The patient tolerated the procedure well.  HEMODYNAMIC DATA:  Central aortic pressure 139/86.  Left ventricular pressure 139/15.  Post A-wave 23.  ANGIOGRAPHIC DATA:  Left main coronary artery was angiographically normal and bifurcated into an LAD and left circumflex system.  The LAD was widely patent without evidence for restenosis of the prior cutting balloon arthrotomy site.  The LAD extended to the LV apex.  The left circumflex vessel had widely patent stent with  segments with evidence for 0 restenosis.  There was a small branch that arose within the stented region which had 60% ostial narrowing followed by 40% narrowing in this very diminutive branch.  The right coronary artery was a dominant vessel, had a 20% proximal narrowing and 20% narrowing just prior to the PDA takeoff.  There was a small anterior RV marginal branch that had 50% ostial narrowing followed by 30% narrowing.  RAO ventriculography revealed normal LV function without wall motion abnormalities.  IMPRESSION: 1. Normal left ventricular function. 2. No evidence for restenosis of the left anterior descending at the     site of prior cutting balloon arthrotomy, widely patent left     circumflex PROMUS DES stents with evidence for very small     diminutive branch narrowing of 60% and a small branch arising from     the stented segment and right coronary artery with 20% proximal     narrowing followed by 20% distal narrowing with evidence for 50%     proximal stenosis and a small anterior RV marginal branch followed     by 30% stenosis.  RECOMMENDATION:  Medical therapy.  The patient tolerated the procedure well.          ______________________________ Nicki Guadalajara, M.D.     TK/MEDQ  D:  04/03/2012  T:  04/04/2012  Job:  [id number]

## 2012-10-19 ENCOUNTER — Encounter (HOSPITAL_COMMUNITY): Payer: Self-pay | Admitting: *Deleted

## 2012-10-19 ENCOUNTER — Emergency Department (HOSPITAL_COMMUNITY)
Admission: EM | Admit: 2012-10-19 | Discharge: 2012-10-19 | Disposition: A | Discharge status: Home / Self Care | Payer: BC Managed Care – PPO | Attending: Emergency Medicine | Admitting: Emergency Medicine

## 2012-10-19 ENCOUNTER — Emergency Department (HOSPITAL_COMMUNITY): Payer: BC Managed Care – PPO

## 2012-10-19 DIAGNOSIS — Z7982 Long term (current) use of aspirin: Secondary | ICD-10-CM | POA: Insufficient documentation

## 2012-10-19 DIAGNOSIS — Z87891 Personal history of nicotine dependence: Secondary | ICD-10-CM | POA: Insufficient documentation

## 2012-10-19 DIAGNOSIS — I2510 Atherosclerotic heart disease of native coronary artery without angina pectoris: Secondary | ICD-10-CM | POA: Insufficient documentation

## 2012-10-19 DIAGNOSIS — I10 Essential (primary) hypertension: Secondary | ICD-10-CM | POA: Insufficient documentation

## 2012-10-19 DIAGNOSIS — Z880 Allergy status to penicillin: Secondary | ICD-10-CM | POA: Insufficient documentation

## 2012-10-19 DIAGNOSIS — S42309A Unspecified fracture of shaft of humerus, unspecified arm, initial encounter for closed fracture: Secondary | ICD-10-CM | POA: Insufficient documentation

## 2012-10-19 DIAGNOSIS — Y9389 Activity, other specified: Secondary | ICD-10-CM | POA: Insufficient documentation

## 2012-10-19 DIAGNOSIS — Z8701 Personal history of pneumonia (recurrent): Secondary | ICD-10-CM | POA: Insufficient documentation

## 2012-10-19 DIAGNOSIS — Z79899 Other long term (current) drug therapy: Secondary | ICD-10-CM | POA: Insufficient documentation

## 2012-10-19 DIAGNOSIS — I252 Old myocardial infarction: Secondary | ICD-10-CM | POA: Insufficient documentation

## 2012-10-19 DIAGNOSIS — E785 Hyperlipidemia, unspecified: Secondary | ICD-10-CM | POA: Insufficient documentation

## 2012-10-19 DIAGNOSIS — Y929 Unspecified place or not applicable: Secondary | ICD-10-CM | POA: Insufficient documentation

## 2012-10-19 DIAGNOSIS — W010XXA Fall on same level from slipping, tripping and stumbling without subsequent striking against object, initial encounter: Secondary | ICD-10-CM | POA: Insufficient documentation

## 2012-10-19 DIAGNOSIS — S42301A Unspecified fracture of shaft of humerus, right arm, initial encounter for closed fracture: Secondary | ICD-10-CM

## 2012-10-19 IMAGING — CR DG HUMERUS 2V *R*
2 series · 2 of 2 positions shown · non-contrast
Comparison: None.

CLINICAL DATA: Fall with right upper extremity pain.

RIGHT HUMERUS - 2+ VIEW

[view not recorded (1 of 2)]
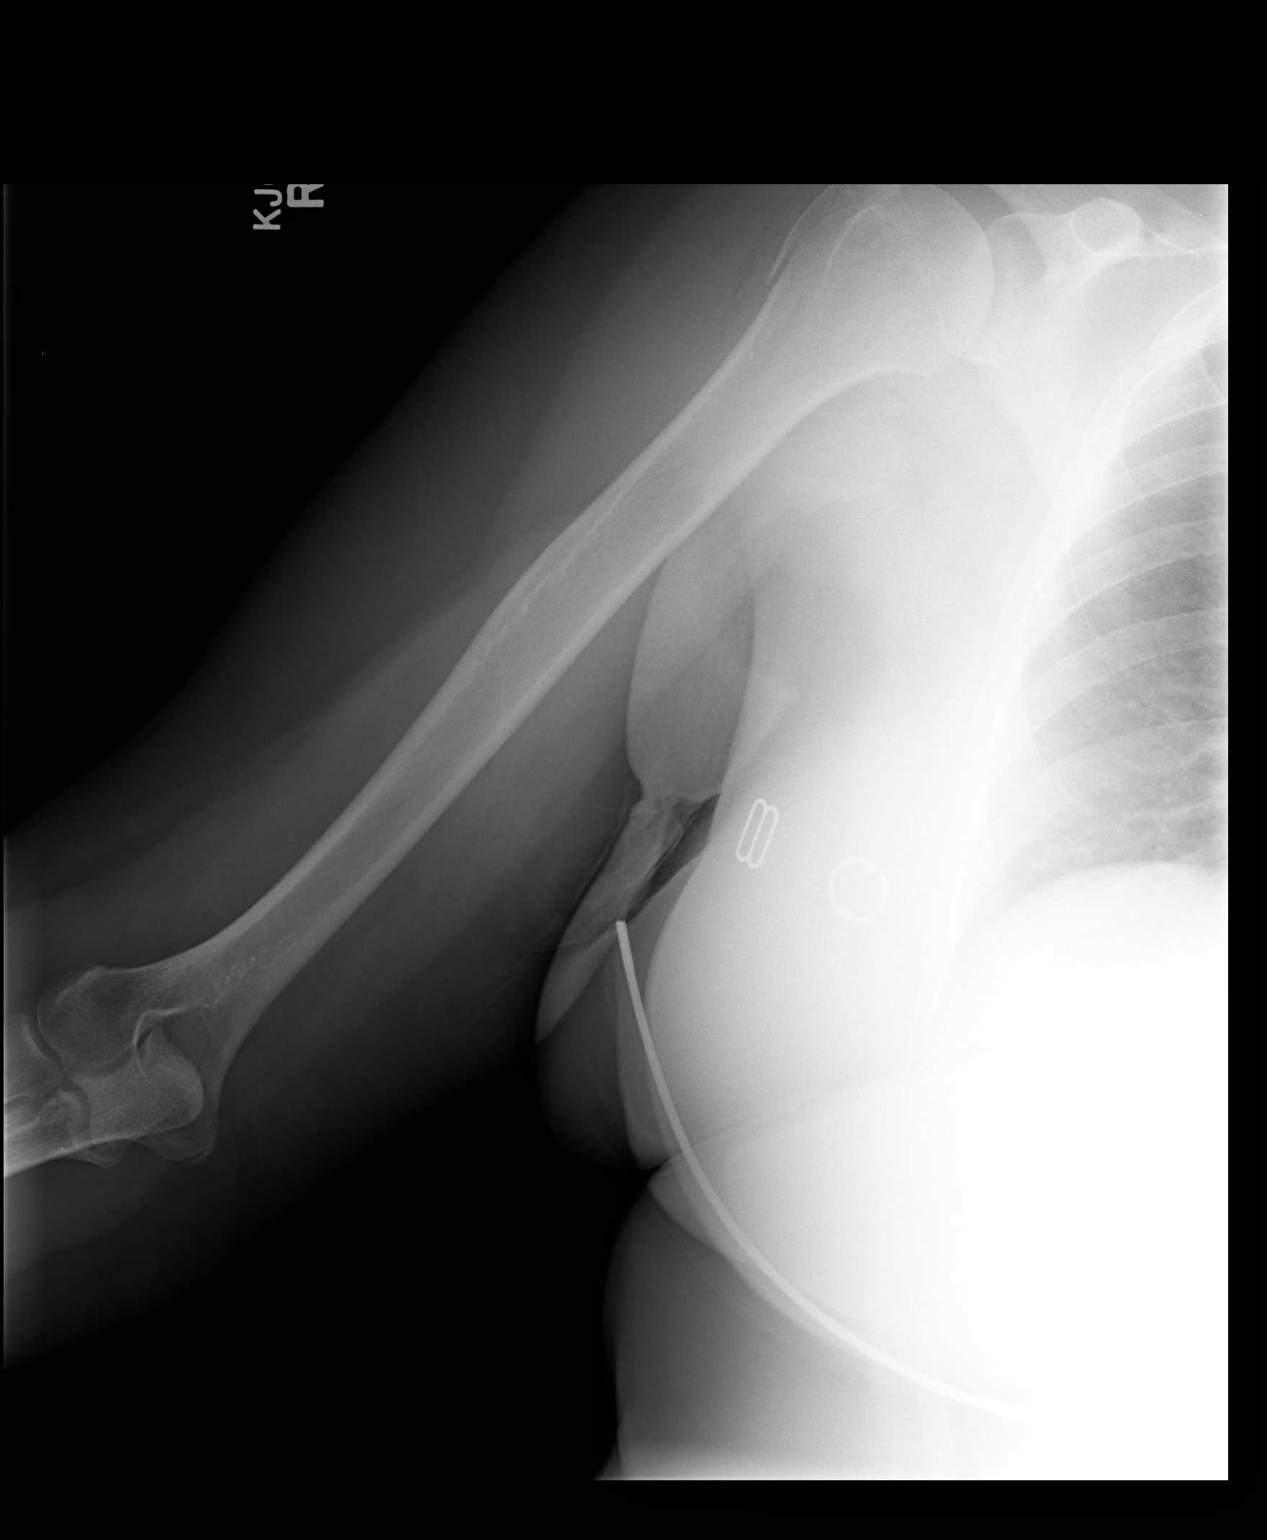

[view not recorded (2 of 2)]
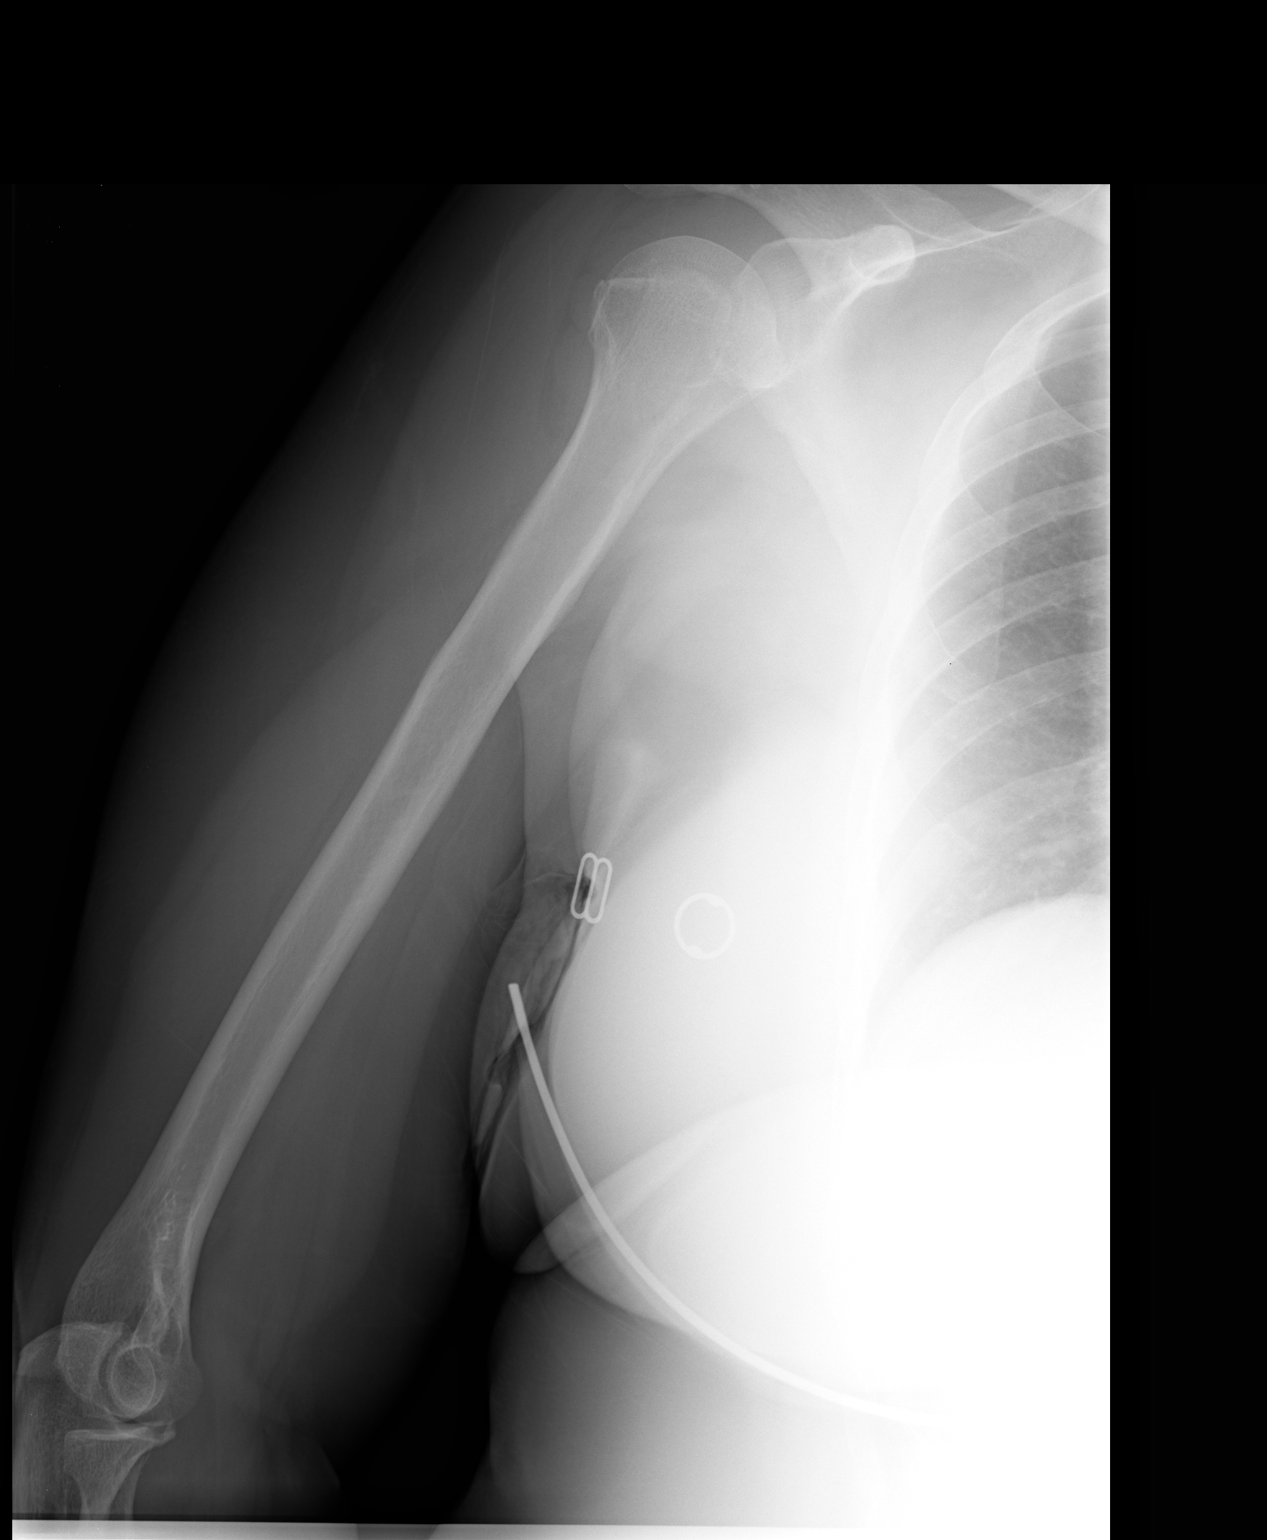

[2 of 2 positions shown; findings below may reference images not displayed]

FINDINGS: There is a longitudinal fracture the greater tuberosity
of the humerus.  I do not observe definite surgical neck fracture.

Spurring is noted along the distal humeral medial epicondyle.
IMPRESSION: 1. Acute longitudinal fracture of the greater tuberosity of the
humerus.

## 2012-10-19 MED ORDER — OXYCODONE-ACETAMINOPHEN 5-325 MG PO TABS: 1.0000 | ORAL_TABLET | ORAL | Status: DC | PRN

## 2012-10-19 MED ORDER — OXYCODONE-ACETAMINOPHEN 5-325 MG PO TABS
2.0000 | ORAL_TABLET | Freq: Once | ORAL | Status: AC
  Administered 2012-10-19: 2 via ORAL

## 2012-10-19 MED ORDER — OXYCODONE-ACETAMINOPHEN 5-325 MG PO TABS
ORAL_TABLET | ORAL | Status: AC
  Filled 2012-10-19: qty 2

## 2012-10-19 NOTE — ED Notes (Signed)
Right arm pain after tripping just PTA.

## 2012-10-19 NOTE — ED Provider Notes (Signed)
History     CSN: [id number]  Arrival date & time 10/19/12  1104   First MD Initiated Contact with Patient 10/19/12 1126      Chief Complaint  Patient presents with  . Arm Pain    (Consider location/radiation/quality/duration/timing/severity/associated sxs/prior treatment) HPI Comments: Patient c/o pain to her right upper arm that began suddenly after a fall.  She states the pain is worse with movement and improves with holding her arm close to her body.  She denies neck pain, dizziness, head injury or LOC.  She also denies sx's prior to the fall.  Patient is a 52 y.o. female presenting with arm injury. The history is provided by the patient.  Arm Injury Location:  Arm Time since incident:  1 hour Injury: yes   Mechanism of injury: fall   Fall:    Fall occurred:  Tripped (patient tripped over a board while walking up a wooden ramp.  )   Height of fall:  From standing position   Point of impact: fell onto her right arm. Arm location:  R upper arm Pain details:    Quality:  Aching and sharp   Radiates to:  Does not radiate   Severity:  Severe   Onset quality:  Sudden   Timing:  Constant   Progression:  Unchanged Chronicity:  New Handedness:  Right-handed Dislocation: no   Foreign body present:  No foreign bodies Prior injury to area:  No Relieved by:  Immobilization Worsened by:  Movement Ineffective treatments:  None tried Associated symptoms: decreased range of motion   Associated symptoms: no back pain, no fever, no muscle weakness, no neck pain, no numbness, no stiffness and no tingling     Past Medical History  Diagnosis Date  . Hypertension   . Coronary artery disease   . Myocardial infarct   . Unstable angina 04/02/2012  . CAD (coronary artery disease), premature-cutting balloon athrectomy LAD 2004, 3 DES-Promus to Prox LCX 2012   04/02/2012  . Morbid obesity with BMI of 40.0-44.9, adult 04/02/2012  . Hyperlipidemia LDL goal < 70 04/02/2012  . Shortness of  breath   . Pneumonia     hx of in 03/2010  . Metabolic syndrome 04/03/2012    Past Surgical History  Procedure Laterality Date  . Abdominal hysterectomy    . Ankle surgery    . Coronary angioplasty      Family History  Problem Relation Age of Onset  . Coronary artery disease Mother   . Cancer - Ovarian Mother   . Coronary artery disease Father   . Heart attack Brother     History  Substance Use Topics  . Smoking status: Former Smoker    Quit date: 06/12/1990  . Smokeless tobacco: Never Used  . Alcohol Use: No    OB History   Grav Para Term Preterm Abortions TAB SAB Ect Mult Living                  Review of Systems  Constitutional: Negative for fever, activity change and appetite change.  HENT: Negative for neck pain.   Respiratory: Negative for shortness of breath.   Gastrointestinal: Negative for nausea and vomiting.  Musculoskeletal: Positive for arthralgias. Negative for back pain, joint swelling, gait problem and stiffness.  Skin: Negative for color change and wound.  Neurological: Negative for dizziness, syncope, facial asymmetry, weakness, numbness and headaches.  Psychiatric/Behavioral: Negative for confusion.  All other systems reviewed and are negative.    Allergies  Penicillins  Home Medications   Current Outpatient Rx  Name  Route  Sig  Dispense  Refill  . acetaminophen (TYLENOL) 500 MG tablet   Oral   Take 1,000 mg by mouth every 6 (six) hours as needed. For pain         . aspirin 81 MG tablet   Oral   Take 81 mg by mouth daily.         Marland Kitchen atorvastatin (LIPITOR) 40 MG tablet   Oral   Take 40 mg by mouth daily.         . enalapril (VASOTEC) 5 MG tablet   Oral   Take 5 mg by mouth daily.         . metoprolol (LOPRESSOR) 50 MG tablet   Oral   Take 50 mg by mouth daily.          . nitroGLYCERIN (NITROSTAT) 0.4 MG SL tablet   Sublingual   Place 0.4 mg under the tongue every 5 (five) minutes as needed. For chest pain          . omeprazole (PRILOSEC) 40 MG capsule   Oral   Take 40 mg by mouth daily.         . prasugrel (EFFIENT) 10 MG TABS   Oral   Take 10 mg by mouth daily.           BP 118/58  Pulse 98  Temp(Src) 97.3 F (36.3 C) (Oral)  Resp 16  Ht 5\' 6"  (1.676 m)  Wt 273 lb (123.832 kg)  BMI 44.08 kg/m2  SpO2 98%  Physical Exam  Nursing note and vitals reviewed. Constitutional: She is oriented to person, place, and time. She appears well-developed and well-nourished. No distress.  HENT:  Head: Normocephalic and atraumatic.  Neck: Normal range of motion. Neck supple. No thyromegaly present.  Cardiovascular: Normal rate, regular rhythm, normal heart sounds and intact distal pulses.   No murmur heard. Pulmonary/Chest: Effort normal and breath sounds normal. No respiratory distress. She exhibits no tenderness.  Musculoskeletal: She exhibits tenderness. She exhibits no edema.  ttp of the proximal right upper arm.  Pain with attempted abduction of the right arm.  Radial pulse is brisk and symmetrical, distal sensation intact, CR< 2 sec.  No abrasions, edema or bony deformity of the shoulder joint. Right elbow and wrist are NT  Lymphadenopathy:    She has no cervical adenopathy.  Neurological: She is alert and oriented to person, place, and time. She exhibits normal muscle tone. Coordination normal.  Skin: Skin is warm and dry.    ED Course  Procedures (including critical care time)  Labs Reviewed - No data to display Dg Humerus Right  10/19/2012  *RADIOLOGY REPORT*  Clinical Data: Fall with right upper extremity pain.  RIGHT HUMERUS - 2+ VIEW  Comparison: None.  Findings: There is a longitudinal fracture the greater tuberosity of the humerus.  I do not observe definite surgical neck fracture.  Spurring is noted along the distal humeral medial epicondyle.  IMPRESSION:  1. Acute longitudinal fracture of the greater tuberosity of the humerus.   Original Report Authenticated By: Gaylyn Rong, M.D.         MDM   Sling applied, pain improved.  Remains NV intact.  Percocet po given and ice packs applied.     Patient requests to f/u with [facility] in [place].  Dr. Carola Frost on call.  Given info for referral.  Pt agrees to ice , elevate and close  f/u with orthopedics.     The patient appears reasonably screened and/or stabilized for discharge and I doubt any other medical condition or other [facility] requiring further screening, evaluation, or treatment in the ED at this time prior to discharge.    Marybelle Giraldo L. Trisha Mangle, PA-C 10/20/12 4098

## 2012-10-23 NOTE — ED Provider Notes (Signed)
Medical screening examination/treatment/procedure(s) were performed by non-physician practitioner and as supervising physician I was immediately available for consultation/collaboration. Dason Mosley, MD, FACEP   Yonathan Perrow L Elita Dame, MD 10/23/12 1528 

## 2014-05-21 ENCOUNTER — Encounter (HOSPITAL_COMMUNITY): Payer: Self-pay | Admitting: Cardiovascular Disease

## 2017-08-16 ENCOUNTER — Emergency Department (HOSPITAL_COMMUNITY): Payer: BLUE CROSS/BLUE SHIELD

## 2017-08-16 ENCOUNTER — Encounter (HOSPITAL_COMMUNITY): Payer: Self-pay

## 2017-08-16 ENCOUNTER — Observation Stay (HOSPITAL_COMMUNITY)
Admission: EM | Admit: 2017-08-16 | Discharge: 2017-08-17 | Disposition: A | Discharge status: Home / Self Care | Payer: BLUE CROSS/BLUE SHIELD | Attending: Family Medicine | Admitting: Family Medicine

## 2017-08-16 DIAGNOSIS — Z87891 Personal history of nicotine dependence: Secondary | ICD-10-CM | POA: Diagnosis not present

## 2017-08-16 DIAGNOSIS — I252 Old myocardial infarction: Secondary | ICD-10-CM | POA: Insufficient documentation

## 2017-08-16 DIAGNOSIS — E669 Obesity, unspecified: Secondary | ICD-10-CM | POA: Diagnosis not present

## 2017-08-16 DIAGNOSIS — R079 Chest pain, unspecified: Secondary | ICD-10-CM

## 2017-08-16 DIAGNOSIS — Z9861 Coronary angioplasty status: Secondary | ICD-10-CM | POA: Diagnosis not present

## 2017-08-16 DIAGNOSIS — Z7982 Long term (current) use of aspirin: Secondary | ICD-10-CM | POA: Diagnosis not present

## 2017-08-16 DIAGNOSIS — E785 Hyperlipidemia, unspecified: Secondary | ICD-10-CM | POA: Insufficient documentation

## 2017-08-16 DIAGNOSIS — I2510 Atherosclerotic heart disease of native coronary artery without angina pectoris: Secondary | ICD-10-CM | POA: Insufficient documentation

## 2017-08-16 DIAGNOSIS — E782 Mixed hyperlipidemia: Secondary | ICD-10-CM | POA: Diagnosis not present

## 2017-08-16 DIAGNOSIS — Z79899 Other long term (current) drug therapy: Secondary | ICD-10-CM | POA: Insufficient documentation

## 2017-08-16 DIAGNOSIS — I10 Essential (primary) hypertension: Secondary | ICD-10-CM | POA: Diagnosis not present

## 2017-08-16 DIAGNOSIS — R0789 Other chest pain: Principal | ICD-10-CM | POA: Insufficient documentation

## 2017-08-16 LAB — BASIC METABOLIC PANEL
Anion gap: 11 (ref 5–15)
BUN: 10 mg/dL (ref 6–20)
CALCIUM: 9.7 mg/dL (ref 8.9–10.3)
CO2: 26 mmol/L (ref 22–32)
CREATININE: 0.86 mg/dL (ref 0.44–1.00)
Chloride: 102 mmol/L (ref 101–111)
GFR calc non Af Amer: >60 mL/min (ref >60)
GLUCOSE: 91 mg/dL (ref 65–99)
Potassium: 4.3 mmol/L (ref 3.5–5.1)
Sodium: 139 mmol/L (ref 135–145)

## 2017-08-16 LAB — CBC
HCT: 40.4 % (ref 36.0–46.0)
Hemoglobin: 13.4 g/dL (ref 12.0–15.0)
MCH: 28.5 pg (ref 26.0–34.0)
MCHC: 33.2 g/dL (ref 30.0–36.0)
MCV: 85.8 fL (ref 78.0–100.0)
Platelets: 242 10*3/uL (ref 150–400)
RBC: 4.71 MIL/uL (ref 3.87–5.11)
RDW: 12.9 % (ref 11.5–15.5)
WBC: 7.7 10*3/uL (ref 4.0–10.5)

## 2017-08-16 LAB — I-STAT TROPONIN, ED: Troponin i, poc: 0 ng/mL (ref 0.00–0.08)

## 2017-08-16 LAB — I-STAT BETA HCG BLOOD, ED (MC, WL, AP ONLY): I-stat hCG, quantitative: <5 m[IU]/mL (ref <5)

## 2017-08-16 IMAGING — DX DG CHEST 2V
2 series · 2 of 2 positions shown · non-contrast
Comparison: [DATE]

CLINICAL DATA: Chest pain and shortness of Breath

EXAM:
CHEST - 2 VIEW

[w chest pa]
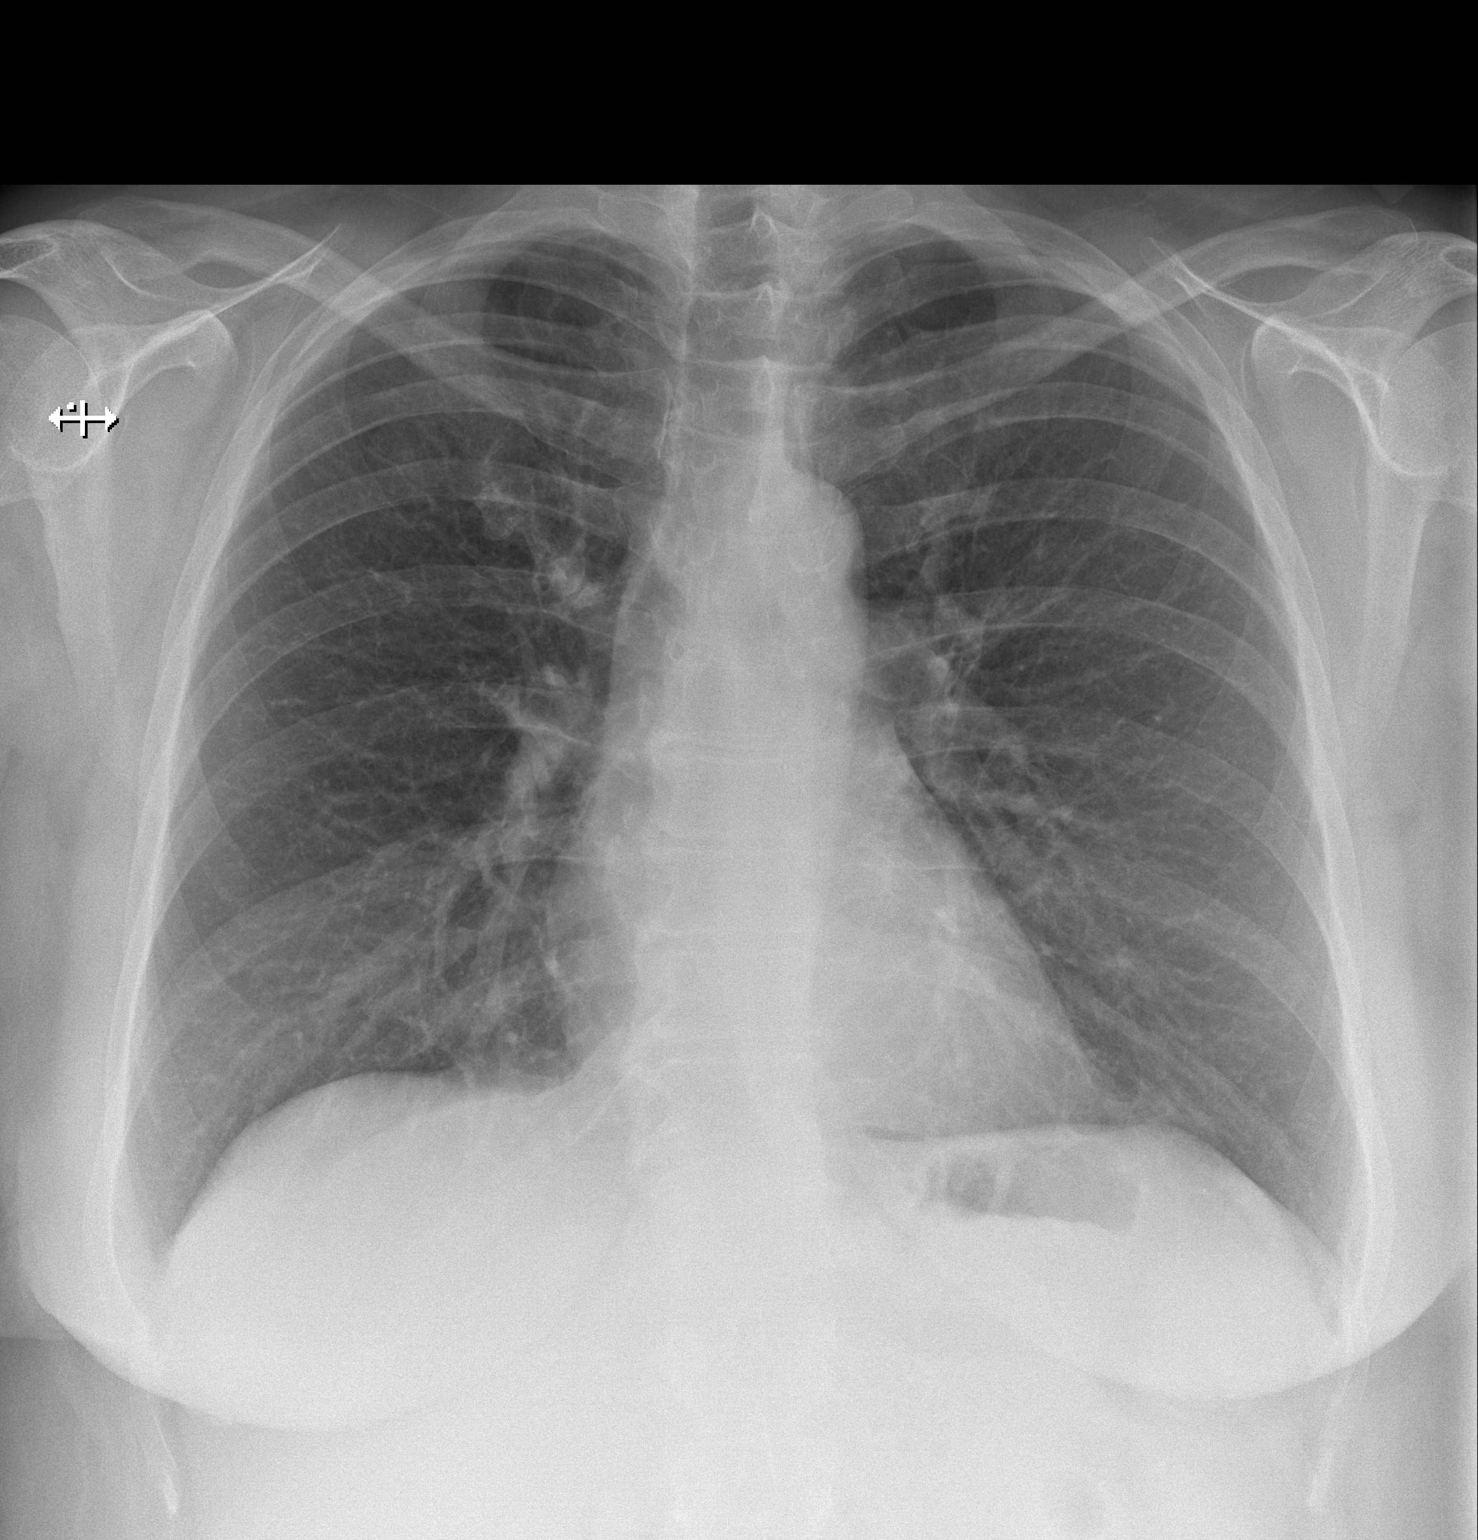

[w chest lat]
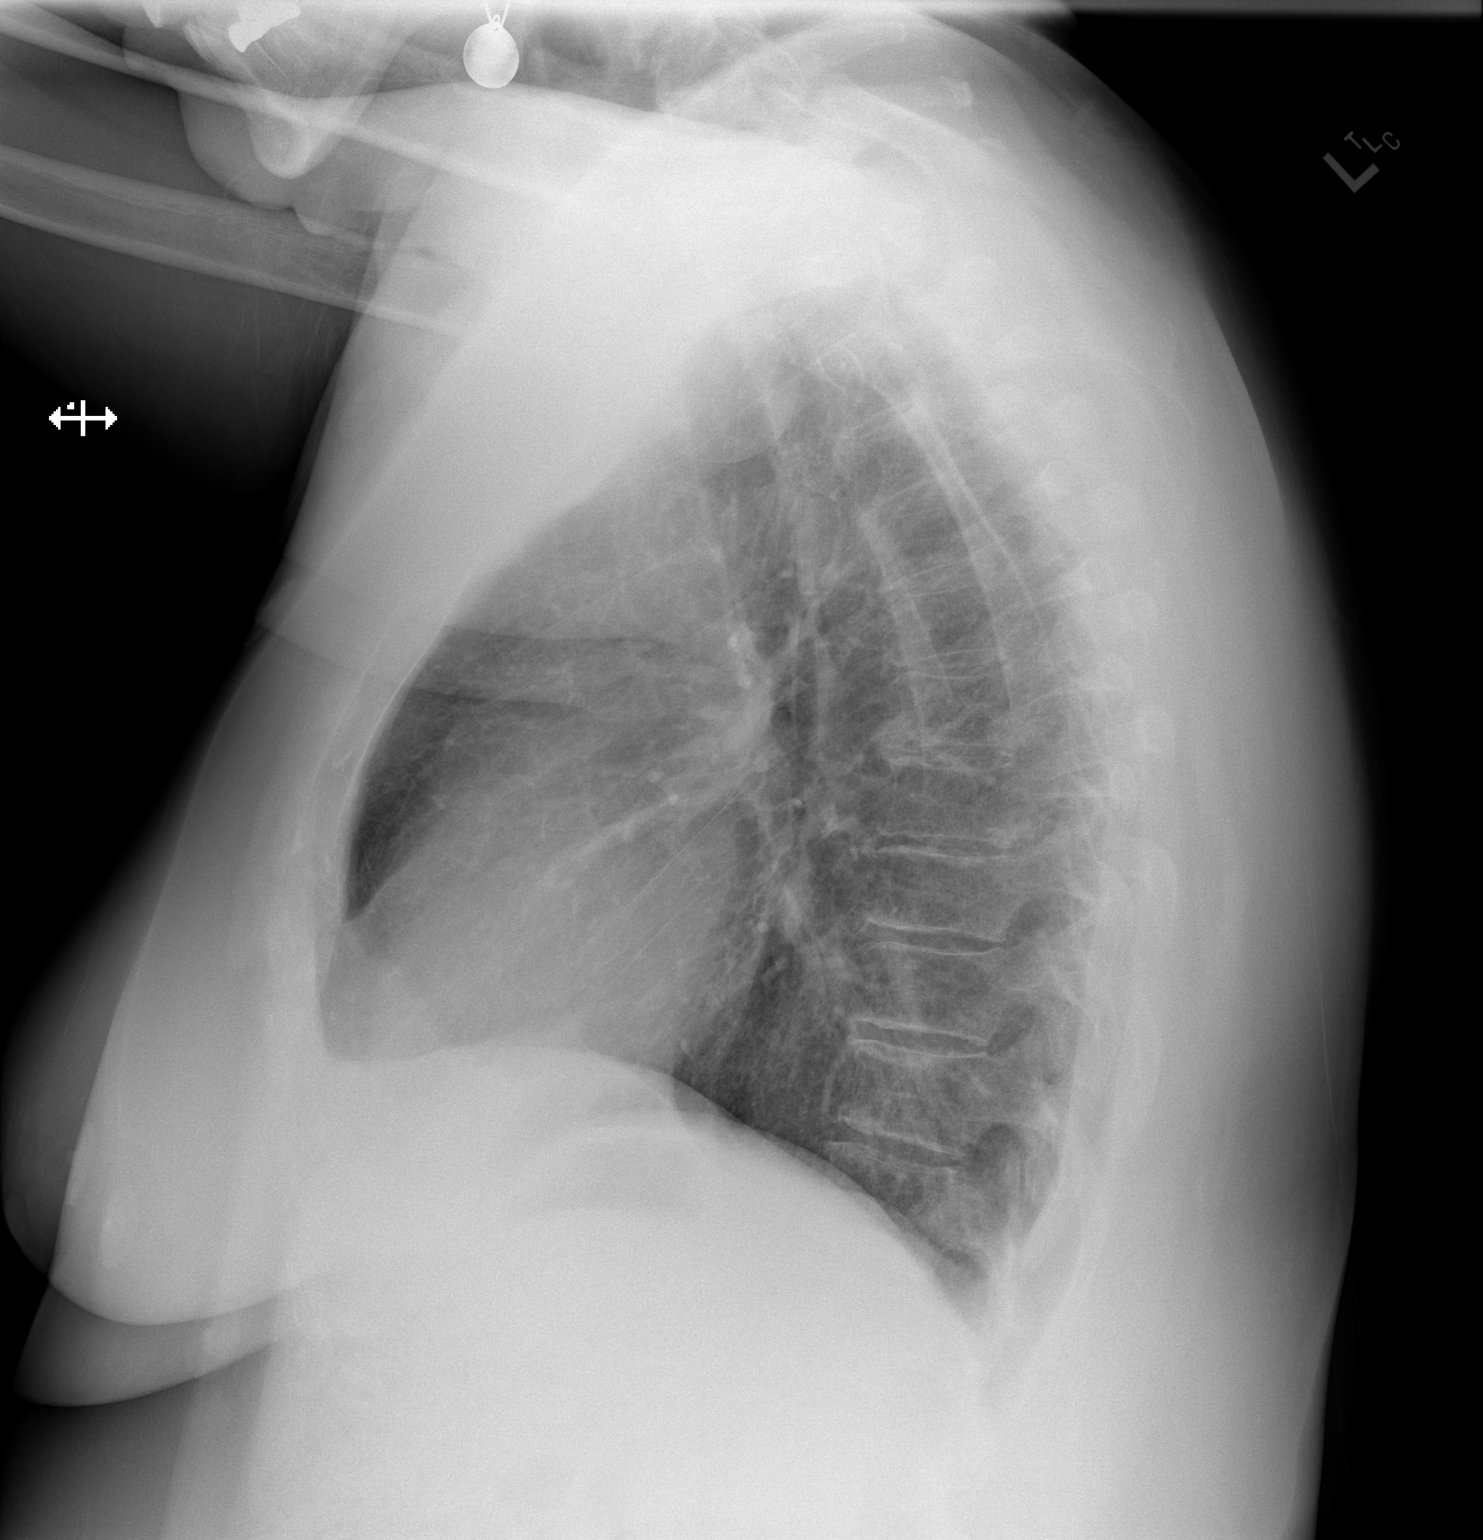

[2 of 2 positions shown; findings below may reference images not displayed]

FINDINGS: The heart size and mediastinal contours are within normal limits.
Both lungs are clear. The visualized skeletal structures are
unremarkable.
IMPRESSION: No active cardiopulmonary disease.

## 2017-08-16 MED ORDER — ENOXAPARIN SODIUM 40 MG/0.4ML ~~LOC~~ SOLN
40.0000 mg | SUBCUTANEOUS | Status: DC
  Administered 2017-08-17: 40 mg via SUBCUTANEOUS
  Filled 2017-08-16: qty 0.4

## 2017-08-16 MED ORDER — ASPIRIN EC 81 MG PO TBEC: 81.0000 mg | DELAYED_RELEASE_TABLET | Freq: Every day | ORAL | Status: DC

## 2017-08-16 MED ORDER — ACETAMINOPHEN 325 MG PO TABS: 650.0000 mg | ORAL_TABLET | ORAL | Status: DC | PRN

## 2017-08-16 MED ORDER — ONDANSETRON HCL 4 MG/2ML IJ SOLN: 4.0000 mg | Freq: Four times a day (QID) | INTRAMUSCULAR | Status: DC | PRN

## 2017-08-16 MED ORDER — ATORVASTATIN CALCIUM 40 MG PO TABS: 40.0000 mg | ORAL_TABLET | Freq: Every day | ORAL | Status: DC

## 2017-08-16 NOTE — ED Triage Notes (Signed)
PT reports central chest pain that began about 3 hours ago while sitting at work. Pt states she was sitting at her desk when she began to have the pain and "it feels like its cutting my breath off and it hurts worse when I breathe". Pt reports pain radiates to left shoulder. She also endorses "tunnel vision" and palpitations.

## 2017-08-16 NOTE — ED Provider Notes (Signed)
[facility] EMERGENCY DEPARTMENT Provider Note   CSN: [id number] Arrival date & time: 08/16/17  1445     History   Chief Complaint Chief Complaint  Patient presents with  . Chest Pain  . Shortness of Breath    HPI [name] is a 58 y.o. female.  HPI   Patient is a 57 year old female presenting with chest pain.  Patient had just sitting at her desk today when she had chest heaviness that radiated up in her left neck.  Associated a feeling of shortness of breath and chest tightness.  No nausea.  No diaphoresis.  Patient has past medical history significant for several DES  placed in 2012.  Patient had catheterization done in 2013 that showed relatively normal findings. (reviewed cath report 2013)  On oral estrogens, no prolonged immobility.  Past Medical History:  Diagnosis Date  . CAD (coronary artery disease), premature-cutting balloon athrectomy LAD 2004, 3 DES-Promus to Prox LCX 2012   04/02/2012  . Coronary artery disease   . Hyperlipidemia LDL goal < 70 04/02/2012  . Hypertension   . Metabolic syndrome 04/03/2012  . Morbid obesity with BMI of 40.0-44.9, adult (HCC) 04/02/2012  . Myocardial infarct (HCC)   . Pneumonia    hx of in 03/2010  . Shortness of breath   . Unstable angina (HCC) 04/02/2012    Patient Active Problem List   Diagnosis Date Noted  . Metabolic syndrome 04/03/2012  . HTN (hypertension) 04/03/2012  . Family history of coronary artery disease 04/03/2012  . Unstable angina (HCC) 04/02/2012  . CAD (coronary artery disease), premature;  cutting balloon athrectomy LAD 2004, 3 DES-Promus to Prox LCX 2012   04/02/2012  . Morbid obesity with BMI of 40.0-44.9, adult (HCC) 04/02/2012  . Hyperlipidemia LDL goal < 70 04/02/2012  . 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 ABDOMINAL HYSTERECTOMY    . ANKLE SURGERY    . CORONARY ANGIOPLASTY    . LEFT HEART CATHETERIZATION WITH CORONARY  ANGIOGRAM N/A 04/03/2012   Procedure: LEFT HEART CATHETERIZATION WITH CORONARY ANGIOGRAM;  Surgeon: Lennette Bihari, MD;  Location: [facility] CATH LAB;  Service: Cardiovascular;  Laterality: N/A;    OB History    No data available       Home Medications    Prior to Admission medications   Medication Sig Start Date End Date Taking? Authorizing Provider  acetaminophen (TYLENOL) 500 MG tablet Take 1,000 mg by mouth every 6 (six) hours as needed. For pain    [provider]  aspirin 81 MG tablet Take 81 mg by mouth daily.    [provider]  atorvastatin (LIPITOR) 40 MG tablet Take 40 mg by mouth daily.    [provider]  enalapril (VASOTEC) 5 MG tablet Take 5 mg by mouth daily.    [provider]  metoprolol (LOPRESSOR) 50 MG tablet Take 50 mg by mouth daily.     [provider]  nitroGLYCERIN (NITROSTAT) 0.4 MG SL tablet Place 0.4 mg under the tongue every 5 (five) minutes as needed. For chest pain    [provider]  omeprazole (PRILOSEC) 40 MG capsule Take 40 mg by mouth daily.    [provider]  oxyCODONE-acetaminophen (PERCOCET/ROXICET) 5-325 MG per tablet Take 1 tablet by mouth every 4 (four) hours as needed for pain. 10/19/12   Triplett, Tammy, PA-C  prasugrel (EFFIENT) 10 MG TABS Take 10 mg by mouth daily.  [provider]    Family History Family History  Problem Relation Age of Onset  . Coronary artery disease Mother   . Cancer - Ovarian Mother   . Coronary artery disease Father   . Heart attack Brother     Social History Social History   Tobacco Use  . Smoking status: Former Smoker    Last attempt to quit: 06/12/1990    Years since quitting: 27.1  . Smokeless tobacco: Never Used  Substance Use Topics  . Alcohol use: No  . Drug use: No     Allergies   Niacin and related and Penicillins   Review of Systems Review of Systems  Constitutional: Negative for activity change.  Respiratory: Negative  for shortness of breath.   Cardiovascular: Negative for chest pain.  Gastrointestinal: Negative for abdominal pain.  All other systems reviewed and are negative.    Physical Exam Updated Vital Signs BP 123/61 (BP Location: Right Arm)   Pulse 62   Temp 98 F (36.7 C) (Oral)   Resp 16   SpO2 99%   Physical Exam  Constitutional: She is oriented to person, place, and time. She appears well-developed and well-nourished.  HENT:  Head: Normocephalic and atraumatic.  Eyes: Right eye exhibits no discharge.  Cardiovascular: Normal rate, regular rhythm, normal heart sounds, intact distal pulses and normal pulses.  No murmur heard. Pulmonary/Chest: Effort normal and breath sounds normal. No respiratory distress. She has no wheezes. She has no rales.  Abdominal: Soft. She exhibits no distension. There is no tenderness.  Neurological: She is oriented to person, place, and time.  Skin: Skin is warm and dry. She is not diaphoretic.  Psychiatric: She has a normal mood and affect.  Nursing note and vitals reviewed.    ED Treatments / Results  Labs (all labs ordered are listed, but only abnormal results are displayed) Labs Reviewed  BASIC METABOLIC PANEL  CBC  I-STAT TROPONIN, ED  I-STAT BETA HCG BLOOD, ED ([facility], [facility], [facility] ONLY)    EKG  EKG Interpretation None       Radiology Dg Chest 2 View  Result Date: 08/16/2017 CLINICAL DATA:  Chest pain and shortness of Breath EXAM: CHEST - 2 VIEW COMPARISON:  04/02/2012 FINDINGS: The heart size and mediastinal contours are within normal limits. Both lungs are clear. The visualized skeletal structures are unremarkable. IMPRESSION: No active cardiopulmonary disease. Electronically Signed   By: Alcide CleverMark  Lukens M.D.   On: 08/16/2017 15:34    Procedures Procedures (including critical care time)  Medications Ordered in ED Medications - No data to display   Initial Impression / Assessment and Plan / ED Course  I have reviewed the triage vital signs  and the nursing notes.  Pertinent labs & imaging results that were available during my care of the patient were reviewed by me and considered in my medical decision making (see chart for details).      Patient is a 57 year old female presenting with chest pain.  Patient had just sitting at her desk today when she had chest heaviness that radiated up in her left neck.  Associated a feeling of shortness of breath and chest tightness.  No nausea.  No diaphoresis.  Patient has past medical history significant for several DES  placed in 2012.  Patient had catheterization done in 2013 that showed relatively normal findings. (reviewed cath report 2013)  On oral estrogens, no prolonged immobility.   4:18 PM No active pain currently.  Current EKG is nonischemic with  a negative troponin.  Given patient's heart score being elevated since she has had multiple stents, will admit to hospitalist for serial troponins.  Final Clinical Impressions(s) / ED Diagnoses   Final diagnoses:  None    ED Discharge Orders    None       Abelino Derrick, MD 08/16/17 2336

## 2017-08-16 NOTE — ED Notes (Signed)
Admitting MD at bedside.

## 2017-08-16 NOTE — H&P (Addendum)
Family Medicine Teaching [facility] Admission History and Physical Service Pager: [phone]  Patient name: [name] Medical record number: [id number] Date of birth: 1960/11/21 Age: 57 y.o. Gender: female  Primary Care Provider: Gareth Morgan, MD Consultants: Cardiology Code Status: Full  Chief Complaint: chest pain  Assessment and Plan: [name] is a 57 y.o. female presenting with chest pain. PMH is significant for CAD s/p cutting balloon atherectomy to the LAD in 2004 and MI in 2012 s/p PCI with DES x 3, HLD.  Atypical Chest Pain: Heart score is a 4. Patient has known CAD s/p cutting balloon atherectomy to the LAD in 2004. She then had an MI in 2012 s/p PCI with DES x 3. Concern for ACS given that she is having substernal chest pain that radiates to the left chest and left jaw in a patient with known CAD. Also concerning that she has not seen a cardiologist since her previous cardiologist retired and she is not currently taking any medications (she took Plavix for 5 years and then stopped it on her own). She does have atypical features, including chest pain that started at rest and feels like "cold air in her chest". I-stat troponin 0.00 and initial EKG without any ST/T wave changes. Musculoskeletal etiology felt to be less likely because pain is not reproducible on exam. GI etiology unlikely without any symptoms of heartburn. PE unlikely as wells score is a 0. - Admit to telemetry under observation status, attending Dr. Deirdre Priest - Consider cardiology consult in the morning, given patient's risk factors and need to establish care with a new cardiologist. - Trend troponins - Repeat AM EKG - Risk stratification labs- A1c, TSH, lipid panel - Restart Aspirin 81mg  daily - Consider starting beta blocker. Will hold off for now with HRs in the 50s-60s - Cardiac monitoring - Vitals per unit routine  HLD: Previously on Lipitor 40mg  daily, but stopped taking this many years ago.  Last lipid panel in 2013 with Chol 179, HDL 36, LDL 93, TG 249 - Check lipid panel  FEN/GI: Heart healthy carb-modified diet Prophylaxis: Lovenox  Disposition: Here for overnight observation. Anticipate discharge home in 1-2 days.  History of Present Illness:  [name] is a 57 y.o. female presenting with chest pain. She was at work this morning when she started "having trouble getting a breath". She couldn't take a deep breath. She then developed chest pain in the center of her chest that radiated to the left side of her chest and to her left jaw. The pain "felt like cold air in her chest" and "a vice grip". She was having episodes of chest pain lasting 30 seconds to 1 minute. The pain was better with "filling up her diaphragm". She does not know if the pain got worse with walking, because she didn't walk once it started. No nausea, but she felt queasy. No diaphoresis.   In the ED, she was normotensive. HRs in the high 50s - low 60s. O2 saturations were 99-100% on RA. Labs were unremarkable. I-stat trop 0.00. Beta-hCG <5.0. EKG with NSR. CXR negative. She was admitted for ACS rule out.  Review Of Systems: Per HPI with the following additions: see below  Review of Systems  Constitutional: Negative for chills and fever.  HENT: Negative for congestion, hearing loss and sore throat.   Eyes: Negative for blurred vision and double vision.  Respiratory: Positive for shortness of breath. Negative for cough.   Cardiovascular: Positive for chest pain. Negative for  palpitations and leg swelling.  Gastrointestinal: Positive for nausea. Negative for abdominal pain, diarrhea and vomiting.  Genitourinary: Negative for dysuria and frequency.  Musculoskeletal: Negative for falls and myalgias.  Neurological: Negative for dizziness and headaches.  Psychiatric/Behavioral: Negative for depression. The patient is not nervous/anxious.     Patient Active Problem List   Diagnosis Date Noted  . Metabolic  syndrome 04/03/2012  . HTN (hypertension) 04/03/2012  . Family history of coronary artery disease 04/03/2012  . Unstable angina (HCC) 04/02/2012  . CAD (coronary artery disease), premature;  cutting balloon athrectomy LAD 2004, 3 DES-Promus to Prox LCX 2012   04/02/2012  . Morbid obesity with BMI of 40.0-44.9, adult (HCC) 04/02/2012  . Hyperlipidemia LDL goal < 70 04/02/2012  . 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 CAD (coronary artery disease), premature-cutting balloon athrectomy LAD 2004, 3 DES-Promus to Prox LCX 2012   04/02/2012  . Coronary artery disease   . Hyperlipidemia LDL goal < 70 04/02/2012  . Hypertension   . Metabolic syndrome 04/03/2012  . Morbid obesity with BMI of 40.0-44.9, adult (HCC) 04/02/2012  . Myocardial infarct (HCC)   . Pneumonia    hx of in 03/2010  . Shortness of breath   . Unstable angina (HCC) 04/02/2012    Past Surgical History: Past Surgical History:  Procedure Laterality Date  . ABDOMINAL HYSTERECTOMY    . ANKLE SURGERY    . CORONARY ANGIOPLASTY    . LEFT HEART CATHETERIZATION WITH CORONARY ANGIOGRAM N/A 04/03/2012   Procedure: LEFT HEART CATHETERIZATION WITH CORONARY ANGIOGRAM;  Surgeon: Lennette Bihari, MD;  Location: [facility] CATH LAB;  Service: Cardiovascular;  Laterality: N/A;    Social History: Social History   Tobacco Use  . Smoking status: Former Smoker    Last attempt to quit: 06/12/1990    Years since quitting: 27.1  . Smokeless tobacco: Never Used  Substance Use Topics  . Alcohol use: No  . Drug use: No   Additional social history: none Please also refer to relevant sections of EMR.  Family History: Family History  Problem Relation Age of Onset  . Coronary artery disease Mother   . Cancer - Ovarian Mother   . Coronary artery disease Father   . Heart attack Brother     Allergies and Medications: Allergies  Allergen Reactions  . Niacin And Related   . Penicillins  Nausea And Vomiting   No current facility-administered medications on file prior to encounter.    Current Outpatient Medications on File Prior to Encounter  Medication Sig Dispense Refill  . acetaminophen (TYLENOL) 500 MG tablet Take 1,000 mg by mouth every 6 (six) hours as needed. For pain    . aspirin 81 MG tablet Take 81 mg by mouth daily.    Marland Kitchen atorvastatin (LIPITOR) 40 MG tablet Take 40 mg by mouth daily.    . enalapril (VASOTEC) 5 MG tablet Take 5 mg by mouth daily.    . metoprolol (LOPRESSOR) 50 MG tablet Take 50 mg by mouth daily.     . nitroGLYCERIN (NITROSTAT) 0.4 MG SL tablet Place 0.4 mg under the tongue every 5 (five) minutes as needed. For chest pain    . omeprazole (PRILOSEC) 40 MG capsule Take 40 mg by mouth daily.    Marland Kitchen oxyCODONE-acetaminophen (PERCOCET/ROXICET) 5-325 MG per tablet Take 1 tablet by mouth every 4 (four) hours as needed for pain. (Patient not taking: Reported on 08/16/2017) 24 tablet 0  .  prasugrel (EFFIENT) 10 MG TABS Take 10 mg by mouth daily.      Objective: BP (!) 108/56   Pulse (!) 59   Temp 98 F (36.7 C) (Oral)   Resp 19   SpO2 98%  Exam: General: Well-appearing, laying in bed, in NAD, pleasant Eyes: EOMI, PERRLA, no scleral icterus ENTM: Nose normal, MMM Neck: Supple, full ROM Cardiovascular: RRR, no murmurs, ?discomfort with palpation of the sternum Respiratory: CTAB, normal work of breathing Gastrointestinal: +BS, soft, non-tender, non-distended MSK: Warm and well-perfused, no edmea Derm: Dry skin on the lower legs bilaterally, no rashes Neuro: Awake, alert, oriented x 3, CN 2-12 grossly intact, no focal deficits Psych: Appropriate affect, normal behavior  Labs and Imaging: CBC BMET  Recent Labs  Lab 08/16/17 1508  WBC 7.7  HGB 13.4  HCT 40.4  PLT 242   Recent Labs  Lab 08/16/17 1508  NA 139  K 4.3  CL 102  CO2 26  BUN 10  CREATININE 0.86  GLUCOSE 91  CALCIUM 9.7     -I-stat trop 0.00 -Beta-hCG <5.0 -EKG with NSR -CXR  negative  Hansford Hirt, Allyn KennerKaty Dodd, MD 08/16/2017, 7:32 PM PGY-3, [place] [facility] FPTS Intern pager: [phone], text pages welcome

## 2017-08-17 ENCOUNTER — Observation Stay (HOSPITAL_BASED_OUTPATIENT_CLINIC_OR_DEPARTMENT_OTHER): Payer: BLUE CROSS/BLUE SHIELD

## 2017-08-17 ENCOUNTER — Other Ambulatory Visit: Payer: Self-pay

## 2017-08-17 DIAGNOSIS — R079 Chest pain, unspecified: Secondary | ICD-10-CM

## 2017-08-17 DIAGNOSIS — I2510 Atherosclerotic heart disease of native coronary artery without angina pectoris: Secondary | ICD-10-CM

## 2017-08-17 DIAGNOSIS — R0789 Other chest pain: Secondary | ICD-10-CM | POA: Diagnosis not present

## 2017-08-17 DIAGNOSIS — I10 Essential (primary) hypertension: Secondary | ICD-10-CM | POA: Diagnosis not present

## 2017-08-17 DIAGNOSIS — E782 Mixed hyperlipidemia: Secondary | ICD-10-CM

## 2017-08-17 DIAGNOSIS — I252 Old myocardial infarction: Secondary | ICD-10-CM | POA: Diagnosis not present

## 2017-08-17 LAB — NM MYOCAR MULTI W/SPECT W/WALL MOTION / EF
CSEPED: 6 min
CSEPEDS: 17 s
CSEPEW: 7.4 METS
CSEPHR: 104 %
CSEPPHR: 171 {beats}/min
MPHR: 163 {beats}/min
Rest HR: 60 {beats}/min

## 2017-08-17 LAB — LIPID PANEL
CHOL/HDL RATIO: 5.5 ratio
CHOLESTEROL: 251 mg/dL — AB (ref 0–200)
HDL: 46 mg/dL (ref >40)
LDL CALC: 175 mg/dL — AB (ref 0–99)
TRIGLYCERIDES: 151 mg/dL — AB (ref <150)
VLDL: 30 mg/dL (ref 0–40)

## 2017-08-17 LAB — BASIC METABOLIC PANEL
Anion gap: 12 (ref 5–15)
BUN: 15 mg/dL (ref 6–20)
CHLORIDE: 102 mmol/L (ref 101–111)
CO2: 25 mmol/L (ref 22–32)
CREATININE: 0.75 mg/dL (ref 0.44–1.00)
Calcium: 9.7 mg/dL (ref 8.9–10.3)
GFR calc non Af Amer: >60 mL/min (ref >60)
Glucose, Bld: 87 mg/dL (ref 65–99)
POTASSIUM: 3.8 mmol/L (ref 3.5–5.1)
Sodium: 139 mmol/L (ref 135–145)

## 2017-08-17 LAB — CBC
HEMATOCRIT: 38.8 % (ref 36.0–46.0)
Hemoglobin: 13 g/dL (ref 12.0–15.0)
MCH: 28.6 pg (ref 26.0–34.0)
MCHC: 33.5 g/dL (ref 30.0–36.0)
MCV: 85.5 fL (ref 78.0–100.0)
Platelets: 227 10*3/uL (ref 150–400)
RBC: 4.54 MIL/uL (ref 3.87–5.11)
RDW: 13 % (ref 11.5–15.5)
WBC: 8.2 10*3/uL (ref 4.0–10.5)

## 2017-08-17 LAB — TROPONIN I: Troponin I: <0.03 ng/mL (ref <0.03)

## 2017-08-17 LAB — ECHOCARDIOGRAM COMPLETE
Height: 67 in
Weight: 3439.18 oz

## 2017-08-17 LAB — TSH: TSH: 1.668 u[IU]/mL (ref 0.350–4.500)

## 2017-08-17 LAB — HIV ANTIBODY (ROUTINE TESTING W REFLEX): HIV Screen 4th Generation wRfx: NONREACTIVE

## 2017-08-17 MED ORDER — NITROGLYCERIN 0.4 MG SL SUBL: 0.4000 mg | SUBLINGUAL_TABLET | SUBLINGUAL | 3 refills | Status: DC | PRN

## 2017-08-17 MED ORDER — ATORVASTATIN CALCIUM 80 MG PO TABS: 80.0000 mg | ORAL_TABLET | Freq: Every day | ORAL | 0 refills | Status: DC

## 2017-08-17 MED ORDER — TECHNETIUM TC 99M TETROFOSMIN IV KIT
30.0000 | PACK | Freq: Once | INTRAVENOUS | Status: AC | PRN
  Administered 2017-08-17: 30 via INTRAVENOUS

## 2017-08-17 MED ORDER — TECHNETIUM TC 99M TETROFOSMIN IV KIT
10.0000 | PACK | Freq: Once | INTRAVENOUS | Status: AC | PRN
  Administered 2017-08-17: 10 via INTRAVENOUS

## 2017-08-17 MED ORDER — ATORVASTATIN CALCIUM 80 MG PO TABS
80.0000 mg | ORAL_TABLET | Freq: Every day | ORAL | Status: DC
  Administered 2017-08-17: 80 mg via ORAL
  Filled 2017-08-17: qty 1

## 2017-08-17 MED ORDER — REGADENOSON 0.4 MG/5ML IV SOLN
INTRAVENOUS | Status: AC
  Filled 2017-08-17: qty 5

## 2017-08-17 MED ORDER — ASPIRIN 81 MG PO TABS: 81.0000 mg | ORAL_TABLET | Freq: Every day | ORAL | 0 refills | Status: AC

## 2017-08-17 NOTE — Discharge Summary (Signed)
Family Medicine Teaching [facility] Discharge Summary  Patient name: [name] Medical record number: [id number] Date of birth: [birth year] Age: 57 y.o. Gender: female Date of Admission: 08/16/2017  Date of Discharge: 08/17/2017 Admitting Physician: Carney Living, MD  Primary Care Provider: Gareth Morgan, MD Consultants: Cardiology  Indication for Hospitalization: chest pain, ACS r/o  Discharge Diagnoses/Problem List:  Atypical Chest Pain with CAD s/p PCI Hyperlipidemia Obesity  Disposition: home  Discharge Condition: stable  Discharge Exam:  General: NAD, pleasant Eyes: PERRL, EOMI, no conjunctival pallor or injection ENTM: Moist mucous membranes Cardiovascular: RRR, no m/r/g, no LE edema Respiratory: CTA BL, normal work of breathing Gastrointestinal: soft, nontender, nondistended, normoactive BS MSK: moves 4 extremities equally Derm: no rashes appreciated Psych: Appropriate affect  Brief Hospital Course:  Patient is 57 yo female who was admitted for ACS rule out after experiencing chest tightness while at work. Patient has past medical history of CAD s/p cutting balloon atherectomy to the LAD in 2004 and MI in 2012 s/p PCI with DES x 3, HLD. On admission risk stratification labs were obtained showing elevated cholesterol. Patient had previously been on Lipitor but had stopped this on her own after losing her regular doctor and dieting and exercising daily. Patient did lose 80 pounds reportedly.   Patient was followed by cardiology. ACS work up was negative including troponins negative x3 and normal EKG's. Cardiology ordered a nuclear stress test and ECHO. Results listed below. Patient had little change to ECHO and is to follow up outpatient with cardiology.   Issues for Follow Up:  1. Patient may need an outpatient sleep study to rule out OSA. 2. Patient to increase atorvastatin to 80mg  daily. 3. Patient to follow up in the cardiology office.   Significant  Procedures: none  Significant Labs and Imaging:  Recent Labs  Lab 08/16/17 1508 08/17/17 0220  WBC 7.7 8.2  HGB 13.4 13.0  HCT 40.4 38.8  PLT 242 227   Recent Labs  Lab 08/16/17 1508 08/17/17 0220  NA 139 139  K 4.3 3.8  CL 102 102  CO2 26 25  GLUCOSE 91 87  BUN 10 15  CREATININE 0.86 0.75  CALCIUM 9.7 9.7   lipid panel: tot chol 251, LDL 175, HDL 46, TG 151 TSH 1.668 A1c 5.4 Troponins neg x3  MYOCARDIAL IMAGING WITH SPECT (REST AND EXERCISE): 1. No reversible ischemia or infarction. 2. Normal left ventricular wall motion. 3. Left ventricular ejection fraction 58% 4. Non invasive risk stratification*: Low  ECHO: Study Conclusions - Left ventricle: The cavity size was normal. Wall thickness was   increased in a pattern of mild LVH. Systolic function was normal.   The estimated ejection fraction was in the range of 60% to 65%.   Doppler parameters are consistent with abnormal left ventricular   relaxation (grade 1 diastolic dysfunction). The E/e&' ratio is   between 8-15, suggesting indeterminate LV filling pressure. - Mitral valve: Mildly thickened leaflets . There was trivial   regurgitation. - Left atrium: The atrium was normal in size. - Inferior vena cava: The vessel was normal in size. The   respirophasic diameter changes were in the normal range (>= 50%),   consistent with normal central venous pressure.  Results/Tests Pending at Time of Discharge: none  Discharge Medications:  Allergies as of 08/17/2017      Reactions   Niacin And Related    Penicillins Nausea And Vomiting      Medication List    STOP  taking these medications   enalapril 5 MG tablet Commonly known as:  VASOTEC   metoprolol tartrate 50 MG tablet Commonly known as:  LOPRESSOR   omeprazole 40 MG capsule Commonly known as:  PRILOSEC   oxyCODONE-acetaminophen 5-325 MG tablet Commonly known as:  PERCOCET/ROXICET   prasugrel 10 MG Tabs tablet Commonly known as:  EFFIENT      TAKE these medications   acetaminophen 500 MG tablet Commonly known as:  TYLENOL Take 1,000 mg by mouth every 6 (six) hours as needed. For pain   aspirin 81 MG tablet Take 1 tablet (81 mg total) by mouth daily.   atorvastatin 80 MG tablet Commonly known as:  LIPITOR Take 1 tablet (80 mg total) by mouth daily at 6 PM. What changed:    medication strength  how much to take  when to take this   nitroGLYCERIN 0.4 MG SL tablet Commonly known as:  NITROSTAT Place 1 tablet (0.4 mg total) under the tongue every 5 (five) minutes as needed for chest pain. What changed:  You were already taking a medication with the same name, and this prescription was added. Make sure you understand how and when to take each.   nitroGLYCERIN 0.4 MG SL tablet Commonly known as:  NITROSTAT Place 0.4 mg under the tongue every 5 (five) minutes as needed. For chest pain What changed:  Another medication with the same name was added. Make sure you understand how and when to take each.       Discharge Instructions: Please refer to Patient Instructions section of EMR for full details.  Patient was counseled important signs and symptoms that should prompt return to medical care, changes in medications, dietary instructions, activity restrictions, and follow up appointments.   Follow-Up Appointments:   Urias Sheek, [place], DO 08/18/2017, 8:30 AM PGY-1, [facility]

## 2017-08-17 NOTE — Progress Notes (Signed)
Attempted echo.  However, patient was in a stress test.  Will attempt at a later time.

## 2017-08-17 NOTE — Consult Note (Addendum)
Cardiology Consultation:   Patient ID: [name]; [id number]; 1960-08-23   Admit date: 08/16/2017 Date of Consult: 08/17/2017  Primary Care Provider: Gareth Morgan, MD Primary Cardiologist: Lost to follow-up; previously Dr. Tresa Endo (last seen in 2013) Primary Electrophysiologist:  None   Patient Profile:   [name] is a 57 y.o. female with a PMH CAD s/p cutting balloon atherectomy to LAD (2004 and DES/PCI to Proximal Cx (2012), HLD, HTN (off medications after significant weight loss), and morbid obesity who is being seen today for the evaluation of chest pain at the request of Dr. Deirdre Priest.  History of Present Illness:   Ms. Sliker was in her usual state of health until 3/7 around noon when she developed a gripping chest pain with associated SOB and queasiness, radiating to her jaw. The chest pain occurred while she was sitting at her desk and was not exacerbated by walking. She denied diaphoresis, dizziness, or lightheadedness, however she did have some weakness/unsteadiness on her feet. She states the initial intensity of pain lasted less than 1 minute, however did not go away completely. She had a second episode of chest pain which she described as a "vice grip" around 2:30pm prompting her to present to the ED for further evaluation.   Prior to this episode on 3/7, she had been doing well from a cardiac standpoint. Over the past year she has instituted significant lifestyle changes with diet and exercise, resulting in a 100lb weight loss. She has been exercising routinely (although less so in the winter months) and denies experiencing anginal symptoms. Compared to her previous cardiac events, she states did not have the pressure sensation she experienced with her MI. She has not seen a cardiologist since her last cardiac catheterization in 2013.   She is currently chest pain free and has not had any episodes since around 10pm last night. She denies recent travel, fever,  illnesses, orthopnea, PND, LE edema, or DOE. She has noted sleeping poorly with nighttime waking and dose endorse snoring. She has never had a sleep study done and family denies apneic episodes. She has not smoked in >20 years and drinks alcohol socially.   Hospital course: HR in the 50s-60s, BP stable on the soft side, satting well on RA. Labs notable for electrolytes wnl, Cr 0.76, Hgb 13.0, PLT 227, Troponin <0.03 x2, LDL 175, TSH wnl. CXR negative. EKG with NSR, J point elevation of inferior leads, V3-6, no STE/D, no TWI. Patient was admitted to medicine with Cardiology following for further management.   Past Medical History:  Diagnosis Date  . CAD (coronary artery disease), premature-cutting balloon athrectomy LAD 2004, 3 DES-Promus to Prox LCX 2012   04/02/2012  . Coronary artery disease   . Hyperlipidemia LDL goal < 70 04/02/2012  . Hypertension   . Metabolic syndrome 04/03/2012  . Morbid obesity with BMI of 40.0-44.9, adult (HCC) 04/02/2012  . Myocardial infarct (HCC)   . Pneumonia    hx of in 03/2010  . Shortness of breath   . Unstable angina (HCC) 04/02/2012    Past Surgical History:  Procedure Laterality Date  . ABDOMINAL HYSTERECTOMY    . ANKLE SURGERY    . CORONARY ANGIOPLASTY    . LEFT HEART CATHETERIZATION WITH CORONARY ANGIOGRAM N/A 04/03/2012   Procedure: LEFT HEART CATHETERIZATION WITH CORONARY ANGIOGRAM;  Surgeon: Lennette Bihari, MD;  Location: [facility] CATH LAB;  Service: Cardiovascular;  Laterality: N/A;     Home Medications: Patient endorses not being on any cardiac  medications since losing weight.  Prior to Admission medications   Medication Sig Start Date End Date Taking? Authorizing Provider  acetaminophen (TYLENOL) 500 MG tablet Take 1,000 mg by mouth every 6 (six) hours as needed. For pain    [provider]  aspirin 81 MG tablet Take 81 mg by mouth daily.    [provider]  atorvastatin (LIPITOR) 40 MG tablet Take 40 mg by mouth daily.     [provider]  enalapril (VASOTEC) 5 MG tablet Take 5 mg by mouth daily.    [provider]  metoprolol (LOPRESSOR) 50 MG tablet Take 50 mg by mouth daily.     [provider]  nitroGLYCERIN (NITROSTAT) 0.4 MG SL tablet Place 0.4 mg under the tongue every 5 (five) minutes as needed. For chest pain    [provider]  omeprazole (PRILOSEC) 40 MG capsule Take 40 mg by mouth daily.    [provider]  oxyCODONE-acetaminophen (PERCOCET/ROXICET) 5-325 MG per tablet Take 1 tablet by mouth every 4 (four) hours as needed for pain. Patient not taking: Reported on 08/16/2017 10/19/12   Triplett, Babette Relicammy, PA-C  prasugrel (EFFIENT) 10 MG TABS Take 10 mg by mouth daily.    [provider]    Inpatient Medications: Scheduled Meds: . aspirin EC  81 mg Oral Daily  . atorvastatin  40 mg Oral q1800  . enoxaparin (LOVENOX) injection  40 mg Subcutaneous Q24H   Continuous Infusions:  PRN Meds: acetaminophen, ondansetron (ZOFRAN) IV  Allergies:    Allergies  Allergen Reactions  . Niacin And Related   . Penicillins Nausea And Vomiting    Social History:   Social History   Socioeconomic History  . Marital status: Married    Spouse name: Not on file  . Number of children: Not on file  . Years of education: Not on file  . Highest education level: Not on file  Social Needs  . Financial resource strain: Not on file  . Food insecurity - worry: Not on file  . Food insecurity - inability: Not on file  . Transportation needs - medical: Not on file  . Transportation needs - non-medical: Not on file  Occupational History  . Not on file  Tobacco Use  . Smoking status: Former Smoker    Last attempt to quit: 06/12/1990    Years since quitting: 27.2  . Smokeless tobacco: Never Used  Substance and Sexual Activity  . Alcohol use: No  . Drug use: No  . Sexual activity: Not on file  Other Topics Concern  . Not on file  Social History Narrative  . Not on  file    Family History:    Family History  Problem Relation Age of Onset  . Coronary artery disease Mother   . Cancer - Ovarian Mother   . Coronary artery disease Father   . Heart attack Brother      ROS:  Please see the history of present illness.   All other ROS reviewed and negative.     Physical Exam/Data:   Vitals:   08/16/17 2000 08/16/17 2206 08/17/17 0022 08/17/17 0400  BP: 118/65 (!) 131/51 (!) 106/42 (!) 109/54  Pulse: (!) 57 72 64 65  Resp: 14 16 16 16   Temp:  98.1 F (36.7 C) 97.8 F (36.6 C) 98.2 F (36.8 C)  TempSrc:  Oral Oral Oral  SpO2: 100% 98% 97% 94%  Weight:  212 lb 1.6 oz (96.2 kg)  214 lb 15.2  oz (97.5 kg)  Height:  5\' 7"  (1.702 m)      Intake/Output Summary (Last 24 hours) at 08/17/2017 1003 Last data filed at 08/16/2017 2215 Gross per 24 hour  Intake 240 ml  Output -  Net 240 ml   Filed Weights   08/16/17 2206 08/17/17 0400  Weight: 212 lb 1.6 oz (96.2 kg) 214 lb 15.2 oz (97.5 kg)   Body mass index is 33.67 kg/m.  General:  Well nourished, well developed obese female laying in bed in no acute distress HEENT: sclera anicteric  Neck: no JVD Vascular: No carotid bruits; distal pulses 2+ bilaterally Cardiac:  normal S1, S2; RRR; no murmur, gallops, or rubs Lungs: decreased breath sounds throughout (likely 2/2 body habitus), no overt wheezing, rhonchi or rales  Abd: NABS, soft, nontender, no hepatomegaly Ext: no edema Musculoskeletal:  No deformities, BUE and BLE strength normal and equal Skin: warm and dry  Neuro:  CNs 2-12 intact, no focal abnormalities noted Psych:  Normal affect   EKG:  The EKG was personally reviewed and demonstrates:  NSR, J point elevation of inferior leads, V3-6, no STE/D, no TWI. No significant change from previous 03/2012 Telemetry:  Telemetry was personally reviewed and demonstrates:  NSR, occasional PVCs  Relevant CV Studies:  Cardiac catheterization 03/2012: LM: nl LAD: no restenosis, patent LCX: widely  patent stents, very small branch arising from stent with 60% narrowong ostially. RCA: 20% prox and distal stenosis, 50% in Ant RV marginal branch.  NL LV Fxn: EF 65%  Laboratory Data:  Chemistry Recent Labs  Lab 08/16/17 1508 08/17/17 0220  NA 139 139  K 4.3 3.8  CL 102 102  CO2 26 25  GLUCOSE 91 87  BUN 10 15  CREATININE 0.86 0.75  CALCIUM 9.7 9.7  GFRNONAA >60 >60  GFRAA >60 >60  ANIONGAP 11 12    No results for input(s): PROT, ALBUMIN, AST, ALT, ALKPHOS, BILITOT in the last 168 hours. Hematology Recent Labs  Lab 08/16/17 1508 08/17/17 0220  WBC 7.7 8.2  RBC 4.71 4.54  HGB 13.4 13.0  HCT 40.4 38.8  MCV 85.8 85.5  MCH 28.5 28.6  MCHC 33.2 33.5  RDW 12.9 13.0  PLT 242 227   Cardiac Enzymes Recent Labs  Lab 08/16/17 2244 08/17/17 0220  TROPONINI <0.03 <0.03    Recent Labs  Lab 08/16/17 1530  TROPIPOC 0.00    BNPNo results for input(s): BNP, PROBNP in the last 168 hours.  DDimer No results for input(s): DDIMER in the last 168 hours.  Radiology/Studies:  Dg Chest 2 View  Result Date: 08/16/2017 CLINICAL DATA:  Chest pain and shortness of Breath EXAM: CHEST - 2 VIEW COMPARISON:  04/02/2012 FINDINGS: The heart size and mediastinal contours are within normal limits. Both lungs are clear. The visualized skeletal structures are unremarkable. IMPRESSION: No active cardiopulmonary disease. Electronically Signed   By: Alcide Clever M.D.   On: 08/16/2017 15:34    Assessment and Plan:   1. Atypical chest pain in patient with CAD s/p PCI: patient endorses multiple episodes of a gripping chest pain at rest, radiating to her neck, lasting <1 minute, associated with SOB and queasiness. She experienced chest pressure with her prior MI. Troponin's negative x2. EKG non-ischemic. Has not had any cardiac care in the past 6-7 years. Last cath 03/2012 with patent Cx stent and no restenosis of LAD. - Will get echocardiogram to assess LV function and for wall motion  abnormalities - Will obtain an exercise stress  test for ischemic work-up - If above are negative, anticipate she can be discharged home - Continue ASA and statin  2. Dyslipidemia: LDL 175 - Will increase atorvastatin to 80mg  daily given prior CAD  3. Morbid obesity: has had significant weight loss over the past year with diet and exercise.  - Continue to encourage lifestyle modifications  4. Difficulty sleeping: has been having nighttime waking. Also snores. Given obesity, she is certainly at risk for OSA - Consider outpatient sleep study     For questions or updates, please contact [facility] Please consult [email or web address] for contact info under Cardiology/STEMI.   Signed, Beatriz Stallion, PA-C  08/17/2017 10:03 AM [id number]  Attending Note:   The patient was seen and examined.  Agree with assessment and plan as noted above.  Changes made to the above note as needed.  Patient seen and independently examined with  Judy Pimple, PA .   We discussed all aspects of the encounter. I agree with the assessment and plan as stated above.  1.  Chest discomfort: Jeraldin presents with episodes of chest discomfort.  There is a pleuritic component to these chest pains.  She has a history of coronary artery disease but these episodes of chest pain did not feel similar to her previous episodes of angina.  She is been exercising fairly regularly and has not had any episodes of angina chest pain.  Her troponin levels are negative.  Her EKG is nonacute.  We will schedule her for a stress Myoview study today.  We will also get an echocardiogram.  Anticipate that she might be able to go home later today.  I will follow-up with her in the office.  2.  Hyperlipidemia.  Her LDL is markedly elevated at 175.  We will start her on atorvastatin 80 mg a day.   I have spent a total of 40 minutes with patient reviewing hospital  notes , telemetry, EKGs, labs and examining patient as well as  establishing an assessment and plan that was discussed with the patient. > 50% of time was spent in direct patient care.    Vesta Mixer, Montez Hageman., MD, [facility] 08/17/2017, 10:56 AM [address],  [address] Office [phone] Pager [phone]

## 2017-08-17 NOTE — Progress Notes (Signed)
  Echocardiogram 2D Echocardiogram has been performed.  Jancarlos Thrun T Keny Donald 08/17/2017, 2:23 PM

## 2017-08-17 NOTE — Progress Notes (Signed)
Family Medicine Teaching Service Daily Progress Note Intern Pager: [phone]  Patient name: [name] Medical record number: [id number] Date of birth: 1960/11/08 Age: 57 y.o. Gender: female  Primary Care Provider: Gareth MorganKnowlton, Steve, MD Consultants: cardiology Code Status: Full  Pt Overview and Major Events to Date:  3/07: Admitted for ACS r/o  Assessment and Plan: [name] is a 57 y.o. female presenting with chest pain. PMH is significant for CAD s/p cutting balloon atherectomy to the LAD in 2004 and MI in 2012 s/p PCI with DES x 3, HLD.  Atypical Chest Pain: Heart score is a 4. Patient has known CAD s/p cutting balloon atherectomy to the LAD in 2004. She then had an MI in 2012 s/p PCI with DES x 3. Concern for ACS given that she is having substernal chest pain that radiates to the left chest and left jaw in a patient with known CAD. She is not currently taking any medications.  - Cardiology recommendations - Trend troponins: neg x3 - Repeat AM EKG with no ST changes and NSR - Risk stratification labs- A1c pending, TSH 1.668, lipid panel: tot chol 251, LDL 175, HDL 46, TG 151 - Restart Aspirin 81mg  daily - Consider starting beta blocker. Will hold off for now with HRs in the 50s-60s - ECHO ordered  HLD: Previously on Lipitor 40mg  daily, but stopped taking this many years ago. - Will restart statin  FEN/GI: Heart healthy carb-modified diet PPx: Lovenox  Disposition: Inpatient level of care  Subjective:  Patient is feeling better this morning.   Objective: Temp:  [97.8 F (36.6 C)-98.2 F (36.8 C)] 98.2 F (36.8 C) (03/08 0400) Pulse Rate:  [56-72] 65 (03/08 0400) Resp:  [11-20] 16 (03/08 0400) BP: (101-131)/(42-84) 109/54 (03/08 0400) SpO2:  [94 %-100 %] 94 % (03/08 0400) Weight:  [212 lb 1.6 oz (96.2 kg)-214 lb 15.2 oz (97.5 kg)] 214 lb 15.2 oz (97.5 kg) (03/08 0400) Physical Exam: General: NAD, pleasant Eyes: PERRL, EOMI, no conjunctival pallor or  injection ENTM: Moist mucous membranes Cardiovascular: RRR, no m/r/g, no LE edema Respiratory: CTA BL, normal work of breathing Gastrointestinal: soft, nontender, nondistended, normoactive BS MSK: moves 4 extremities equally Derm: no rashes appreciated Psych: Appropriate affect  Laboratory: Recent Labs  Lab 08/16/17 1508 08/17/17 0220  WBC 7.7 8.2  HGB 13.4 13.0  HCT 40.4 38.8  PLT 242 227   Recent Labs  Lab 08/16/17 1508 08/17/17 0220  NA 139 139  K 4.3 3.8  CL 102 102  CO2 26 25  BUN 10 15  CREATININE 0.86 0.75  CALCIUM 9.7 9.7  GLUCOSE 91 87   lipid panel: tot chol 251, LDL 175, HDL 46, TG 151 TSH 1.668  Imaging/Diagnostic Tests: Dg Chest 2 View  Result Date: 08/16/2017 CLINICAL DATA:  Chest pain and shortness of Breath EXAM: CHEST - 2 VIEW COMPARISON:  04/02/2012 FINDINGS: The heart size and mediastinal contours are within normal limits. Both lungs are clear. The visualized skeletal structures are unremarkable. IMPRESSION: No active cardiopulmonary disease. Electronically Signed   By: Alcide CleverMark  Lukens M.D.   On: 08/16/2017 15:34    Kaitlin Ardito, [place], DO 08/17/2017, 10:43 AM PGY-1, [facility]PT[facility] Intern pager: [phone], text pages welcome

## 2017-08-17 NOTE — Discharge Instructions (Addendum)
You were admitted with chest pain. We are glad you are feeling better! Please follow up with a cardiologist. The cardiologist recommended that you start taking Lipitor 80 mg daily due your cholesterol levels, as well as a baby aspirin 81 mg daily. Continue with your exercise plan (recommend 150 minutes per week of cardio) and working on your diet (low salt diet). You had a stress test while admitted which was normal. Your ECHO results were also normal. Please follow up with your primary care doctor in a few days.

## 2017-08-18 LAB — HEMOGLOBIN A1C
HEMOGLOBIN A1C: 5.4 % (ref 4.8–5.6)
Mean Plasma Glucose: 108 mg/dL

## 2019-04-29 ENCOUNTER — Other Ambulatory Visit: Payer: Self-pay

## 2019-04-29 DIAGNOSIS — Z20822 Contact with and (suspected) exposure to covid-19: Secondary | ICD-10-CM

## 2019-05-01 LAB — NOVEL CORONAVIRUS, NAA: SARS-CoV-2, NAA: NOT DETECTED

## 2020-06-08 ENCOUNTER — Ambulatory Visit
Admission: RE | Admit: 2020-06-08 | Discharge: 2020-06-08 | Disposition: A | Discharge status: Home / Self Care | Payer: BC Managed Care – PPO | Source: Ambulatory Visit | Attending: Physician Assistant | Admitting: Physician Assistant

## 2020-06-08 ENCOUNTER — Other Ambulatory Visit: Payer: Self-pay

## 2020-06-08 VITALS — BP 129/82 | HR 94 | Temp 98.0°F | Resp 19

## 2020-06-08 DIAGNOSIS — J069 Acute upper respiratory infection, unspecified: Secondary | ICD-10-CM

## 2020-06-08 DIAGNOSIS — R059 Cough, unspecified: Secondary | ICD-10-CM | POA: Diagnosis not present

## 2020-06-08 MED ORDER — ALBUTEROL SULFATE HFA 108 (90 BASE) MCG/ACT IN AERS: 2.0000 | INHALATION_SPRAY | Freq: Four times a day (QID) | RESPIRATORY_TRACT | 0 refills | Status: DC | PRN

## 2020-06-08 NOTE — ED Triage Notes (Signed)
Pt presents with productive cough since Sunday. Reports using mucinex at home without relief. Pt was exposed to others that were sick with pending covid tests.

## 2020-06-08 NOTE — Discharge Instructions (Signed)
Your covid and influenza are pending.   

## 2020-06-10 LAB — COVID-19, FLU A+B NAA
Influenza A, NAA: NOT DETECTED
Influenza B, NAA: NOT DETECTED
SARS-CoV-2, NAA: DETECTED — AB

## 2020-06-10 NOTE — ED Provider Notes (Signed)
[facility]    CSN: [id number] Arrival date & time: 06/08/20  1250      History   Chief Complaint Chief Complaint  Patient presents with  . Appointment    1  . Cough    HPI MARGEAN KORELL is a 59 y.o. female.   The history is provided by the patient. No language interpreter was used.  Cough Cough characteristics:  Productive Sputum characteristics:  Nondescript Severity:  Moderate Onset quality:  Gradual Duration:  4 days Timing:  Constant Progression:  Worsening Chronicity:  New Relieved by:  Nothing Worsened by:  Nothing Ineffective treatments:  None tried Associated symptoms: fever   Pt reports she was exposed to covid  Past Medical History:  Diagnosis Date  . CAD (coronary artery disease), premature-cutting balloon athrectomy LAD 2004, 3 DES-Promus to Prox LCX 2012   04/02/2012  . Coronary artery disease   . Hyperlipidemia LDL goal < 70 04/02/2012  . Hypertension   . Metabolic syndrome 04/03/2012  . Morbid obesity with BMI of 40.0-44.9, adult (HCC) 04/02/2012  . Myocardial infarct (HCC)   . Pneumonia    hx of in 03/2010  . Shortness of breath   . Unstable angina (HCC) 04/02/2012    Patient Active Problem List   Diagnosis Date Noted  . Chest pain 08/16/2017  . History of MI (myocardial infarction)   . Class 2 severe obesity with serious comorbidity in adult [facility])   . Metabolic syndrome 04/03/2012  . HTN (hypertension) 04/03/2012  . Family history of coronary artery disease 04/03/2012  . Unstable angina (HCC) 04/02/2012  . CAD (coronary artery disease), premature;  cutting balloon athrectomy LAD 2004, 3 DES-Promus to Prox LCX 2012   04/02/2012  . Morbid obesity with BMI of 40.0-44.9, adult (HCC) 04/02/2012  . Hyperlipidemia 04/02/2012  . 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 ABDOMINAL HYSTERECTOMY    . ANKLE SURGERY    . CORONARY ANGIOPLASTY    . LEFT HEART CATHETERIZATION WITH  CORONARY ANGIOGRAM N/A 04/03/2012   Procedure: LEFT HEART CATHETERIZATION WITH CORONARY ANGIOGRAM;  Surgeon: Lennette Bihari, MD;  Location: [facility] CATH LAB;  Service: Cardiovascular;  Laterality: N/A;    OB History   No obstetric history on file.      Home Medications    Prior to Admission medications   Medication Sig Start Date End Date Taking? Authorizing Provider  albuterol (VENTOLIN HFA) 108 (90 Base) MCG/ACT inhaler Inhale 2 puffs into the lungs every 6 (six) hours as needed for wheezing or shortness of breath. 06/08/20  Yes Elson Areas, PA-C  acetaminophen (TYLENOL) 500 MG tablet Take 1,000 mg by mouth every 6 (six) hours as needed. For pain    [provider]  aspirin 81 MG tablet Take 1 tablet (81 mg total) by mouth daily. 08/17/17   Shirley, [place], DO  atorvastatin (LIPITOR) 80 MG tablet Take 1 tablet (80 mg total) by mouth daily at 6 PM. 08/17/17   Talbert Forest, [place], DO  nitroGLYCERIN (NITROSTAT) 0.4 MG SL tablet Place 0.4 mg under the tongue every 5 (five) minutes as needed. For chest pain    [provider]  nitroGLYCERIN (NITROSTAT) 0.4 MG SL tablet Place 1 tablet (0.4 mg total) under the tongue every 5 (five) minutes as needed for chest pain. 08/17/17 08/17/18  Marthenia Rolling, DO    Family History Family History  Problem Relation Age of Onset  . Coronary artery disease Mother   .  Cancer - Ovarian Mother   . Coronary artery disease Father   . Heart attack Brother     Social History Social History   Tobacco Use  . Smoking status: Former Smoker    Quit date: 06/12/1990    Years since quitting: 30.0  . Smokeless tobacco: Never Used  Substance Use Topics  . Alcohol use: No  . Drug use: No     Allergies   Niacin and related and Penicillins   Review of Systems Review of Systems  Constitutional: Positive for fever.  Respiratory: Positive for cough.   All other systems reviewed and are negative.    Physical Exam Triage Vital Signs ED Triage Vitals   Enc Vitals Group     BP 06/08/20 1320 129/82     Pulse Rate 06/08/20 1320 94     Resp 06/08/20 1322 19     Temp 06/08/20 1320 98 F (36.7 C)     Temp src --      SpO2 06/08/20 1320 95 %     Weight --      Height --      Head Circumference --      Peak Flow --      Pain Score 06/08/20 1318 0     Pain Loc --      Pain Edu? --      Excl. in GC? --    No data found.  Updated Vital Signs BP 129/82   Pulse 94   Temp 98 F (36.7 C)   Resp 19   SpO2 95%   Visual Acuity Right Eye Distance:   Left Eye Distance:   Bilateral Distance:    Right Eye Near:   Left Eye Near:    Bilateral Near:     Physical Exam Vitals and nursing note reviewed.  Constitutional:      Appearance: She is well-developed and well-nourished.  HENT:     Head: Normocephalic.     Nose: Nose normal.     Mouth/Throat:     Mouth: Mucous membranes are moist.  Eyes:     Extraocular Movements: EOM normal.     Pupils: Pupils are equal, round, and reactive to light.  Cardiovascular:     Rate and Rhythm: Normal rate.     Pulses: Normal pulses.  Pulmonary:     Effort: Pulmonary effort is normal.  Abdominal:     General: There is no distension.  Musculoskeletal:        General: Normal range of motion.     Cervical back: Normal range of motion.  Skin:    General: Skin is warm.  Neurological:     Mental Status: She is alert and oriented to person, place, and time.  Psychiatric:        Mood and Affect: Mood and affect normal.      UC Treatments / Results  Labs (all labs ordered are listed, but only abnormal results are displayed) Labs Reviewed  COVID-19, FLU A+B NAA - Abnormal; Notable for the following components:      Result Value   SARS-CoV-2, NAA Detected (*)    All other components within normal limits   Narrative:    Test(s) 140142-Influenza A, NAA; 140143-Influenza B, NAA was developed and its performance characteristics determined by [facility]. It has not been cleared or approved by the  Food and Drug Administration. Performed at:  [address] [place], [place]  [id number] Lab Director: Jolene Schimke MD, Phone:  [phone]    EKG   Radiology No results found.  Procedures Procedures (including critical care time)  Medications Ordered in UC Medications - No data to display  Initial Impression / Assessment and Plan / UC Course  I have reviewed the triage vital signs and the nursing notes.  Pertinent labs & imaging results that were available during my care of the patient were reviewed by me and considered in my medical decision making (see chart for details).     MDM:  Pt counseled on possible covid.  Pt given rx for albuterol  Final Clinical Impressions(s) / UC Diagnoses   Final diagnoses:  Cough  Viral URI     Discharge Instructions     Your covid and influenza are pending   ED Prescriptions    Medication Sig Dispense Auth. Provider   albuterol (VENTOLIN HFA) 108 (90 Base) MCG/ACT inhaler Inhale 2 puffs into the lungs every 6 (six) hours as needed for wheezing or shortness of breath. 8 g Elson Areas, [place]     PDMP not reviewed this encounter.  An After Visit Summary was printed and given to the patient.    Elson Areas, [place] 06/10/20 1535

## 2020-10-21 ENCOUNTER — Ambulatory Visit (HOSPITAL_COMMUNITY)
Admission: EM | Admit: 2020-10-21 | Discharge: 2020-10-21 | Disposition: A | Discharge status: Home / Self Care | Payer: BC Managed Care – PPO | Attending: Emergency Medicine | Admitting: Emergency Medicine

## 2020-10-21 ENCOUNTER — Ambulatory Visit (HOSPITAL_COMMUNITY): Payer: Self-pay

## 2020-10-21 ENCOUNTER — Other Ambulatory Visit: Payer: Self-pay

## 2020-10-21 ENCOUNTER — Encounter (HOSPITAL_COMMUNITY): Payer: Self-pay | Admitting: Emergency Medicine

## 2020-10-21 DIAGNOSIS — L03031 Cellulitis of right toe: Secondary | ICD-10-CM

## 2020-10-21 MED ORDER — CEPHALEXIN 500 MG PO CAPS: 1000.0000 mg | ORAL_CAPSULE | Freq: Two times a day (BID) | ORAL | 0 refills | Status: DC

## 2020-10-21 NOTE — ED Triage Notes (Signed)
New pair of shoes rubbed right great toe.  Has been putting neosporin and band aid on toe.  Last night noticed.  Swelling and redness and pain

## 2020-10-21 NOTE — ED Provider Notes (Signed)
[facility]    CSN: [id number] Arrival date & time: 10/21/20  1747      History   Chief Complaint Chief Complaint  Patient presents with  . Appointment    6:00  . Foot Pain    HPI [name] is a 60 y.o. female.   Patient presents with abrasion on left great toe beginning Monday after wearing new pair of shoes. Initially was mildly painful but has progressively worsened over the week. Area is now reddened, swollen and painful to move.Pain has begun to throb and radiate up leg/  Denies numbness or tingling. ROM intact. Has been applying neosporin and band-aid over area. Taking otc tylenol for pain management.   Past Medical History:  Diagnosis Date  . CAD (coronary artery disease), premature-cutting balloon athrectomy LAD 2004, 3 DES-Promus to Prox LCX 2012   04/02/2012  . Coronary artery disease   . Hyperlipidemia LDL goal < 70 04/02/2012  . Hypertension   . Metabolic syndrome 04/03/2012  . Morbid obesity with BMI of 40.0-44.9, adult (HCC) 04/02/2012  . Myocardial infarct (HCC)   . Pneumonia    hx of in 03/2010  . Shortness of breath   . Unstable angina (HCC) 04/02/2012    Patient Active Problem List   Diagnosis Date Noted  . Chest pain 08/16/2017  . History of MI (myocardial infarction)   . Class 2 severe obesity with serious comorbidity in adult [facility])   . Metabolic syndrome 04/03/2012  . HTN (hypertension) 04/03/2012  . Family history of coronary artery disease 04/03/2012  . Unstable angina (HCC) 04/02/2012  . CAD (coronary artery disease), premature;  cutting balloon athrectomy LAD 2004, 3 DES-Promus to Prox LCX 2012   04/02/2012  . Morbid obesity with BMI of 40.0-44.9, adult (HCC) 04/02/2012  . Hyperlipidemia 04/02/2012  . 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 ABDOMINAL HYSTERECTOMY    . ANKLE SURGERY    . CORONARY ANGIOPLASTY    . LEFT HEART CATHETERIZATION WITH CORONARY ANGIOGRAM N/A  04/03/2012   Procedure: LEFT HEART CATHETERIZATION WITH CORONARY ANGIOGRAM;  Surgeon: Lennette Bihari, MD;  Location[facility] C[facility] LAB;  Service: Cardiovascular;  Laterality: N/A;    OB History   No obstetric history on file.      Home Medications    Prior to Admission medications   Medication Sig Start Date End Date Taking? Authorizing Provider  aspirin 81 MG tablet Take 1 tablet (81 mg total) by mouth daily. 08/17/17  Yes Talbert Forest, [place], DO  cephALEXin (KEFLEX) 500 MG capsule Take 2 capsules (1,000 mg total) by mouth 2 (two) times daily. 10/21/20  Yes Dariella Gillihan, Elita Boone, NP  acetaminophen (TYLENOL) 500 MG tablet Take 1,000 mg by mouth every 6 (six) hours as needed. For pain    [provider]  albuterol (VENTOLIN HFA) 108 (90 Base) MCG/ACT inhaler Inhale 2 puffs into the lungs every 6 (six) hours as needed for wheezing or shortness of breath. 06/08/20   Elson Areas, PA-C  atorvastatin (LIPITOR) 80 MG tablet Take 1 tablet (80 mg total) by mouth daily at 6 PM. Patient not taking: Reported on 10/21/2020 08/17/17   Shirley, [place], DO  nitroGLYCERIN (NITROSTAT) 0.4 MG SL tablet Place 0.4 mg under the tongue every 5 (five) minutes as needed. For chest pain    [provider]  nitroGLYCERIN (NITROSTAT) 0.4 MG SL tablet Place 1 tablet (0.4 mg total) under the tongue every 5 (five) minutes  as needed for chest pain. 08/17/17 08/17/18  Marthenia Rolling, DO    Family History Family History  Problem Relation Age of Onset  . Coronary artery disease Mother   . Cancer - Ovarian Mother   . Coronary artery disease Father   . Heart attack Brother     Social History Social History   Tobacco Use  . Smoking status: Former Smoker    Quit date: 06/12/1990    Years since quitting: 30.3  . Smokeless tobacco: Never Used  Vaping Use  . Vaping Use: Never used  Substance Use Topics  . Alcohol use: No  . Drug use: No     Allergies   Niacin and related and Penicillins   Review of  Systems Review of Systems  Constitutional: Negative.   Respiratory: Negative.   Cardiovascular: Negative.   Musculoskeletal: Negative.   Skin: Positive for wound. Negative for color change, pallor and rash.  Neurological: Negative.      Physical Exam Triage Vital Signs ED Triage Vitals  Enc Vitals Group     BP 10/21/20 1841 130/70     Pulse Rate 10/21/20 1841 68     Resp 10/21/20 1841 20     Temp 10/21/20 1841 98.3 F (36.8 C)     Temp Source 10/21/20 1841 Oral     SpO2 10/21/20 1841 96 %     Weight --      Height --      Head Circumference --      Peak Flow --      Pain Score 10/21/20 1836 7     Pain Loc --      Pain Edu? --      Excl. in GC? --    No data found.  Updated Vital Signs BP 130/70 (BP Location: Left Arm) Comment (BP Location): large cuff  Pulse 68   Temp 98.3 F (36.8 C) (Oral)   Resp 20   SpO2 96%   Visual Acuity Right Eye Distance:   Left Eye Distance:   Bilateral Distance:    Right Eye Near:   Left Eye Near:    Bilateral Near:     Physical Exam Constitutional:      Appearance: Normal appearance. She is normal weight.  HENT:     Head: Normocephalic.  Eyes:     Extraocular Movements: Extraocular movements intact.  Pulmonary:     Effort: Pulmonary effort is normal.  Musculoskeletal:        General: Normal range of motion.     Cervical back: Normal range of motion.       Legs:     Comments: Abrasion over right great toe, wound bed appears to be healing, redness is surrounding site, no streaking noted, toe is swollen, tenderness on palpation,  2+ pedal pulses   Neurological:     General: No focal deficit present.     Mental Status: She is alert and oriented to person, place, and time. Mental status is at baseline.  Psychiatric:        Mood and Affect: Mood normal.        Behavior: Behavior normal.        Thought Content: Thought content normal.        Judgment: Judgment normal.      UC Treatments / Results  Labs (all labs  ordered are listed, but only abnormal results are displayed) Labs Reviewed - No data to display  EKG   Radiology No results found.  Procedures  Procedures (including critical care time)  Medications Ordered in UC Medications - No data to display  Initial Impression / Assessment and Plan / UC Course  I have reviewed the triage vital signs and the nursing notes.  Pertinent labs & imaging results that were available during my care of the patient were reviewed by me and considered in my medical decision making (see chart for details).  Cellulitis of great right toe   1. Keflex 1000 mg bid for 5 days 2. otc tylenol for pain management 3. Advised to keep area covered while wearing closed toe shoes to prevent rubbing and when wearing sandals to prevent exposure to debris 4. Strict return precautions for worsening infection, verbalized understanding  Final Clinical Impressions(s) / U C Diagnoses   Final diagnoses:  Cellulitis of great toe of left foot     Discharge Instructions     Take 2 pills of Keflex every morning and every evening for the next 5 days  To keep area covered while out and about and while you have on shoes so the area is not rubbed  Can use over-the-counter Tylenol 650 mg every 6 hours as needed for pain  Please follow-up for increased swelling, increased pain, decreased movement, fevers, chills or if you feel area is not healing     ED Prescriptions    Medication Sig Dispense Auth. Provider   cephALEXin (KEFLEX) 500 MG capsule Take 2 capsules (1,000 mg total) by mouth 2 (two) times daily. 20 capsule Valinda Hoar, NP     PDMP not reviewed this encounter.   Valinda Hoar, NP 10/21/20 1916

## 2020-10-21 NOTE — Discharge Instructions (Addendum)
Take 2 pills of Keflex every morning and every evening for the next 5 days  To keep area covered while out and about and while you have on shoes so the area is not rubbed  Can use over-the-counter Tylenol 650 mg every 6 hours as needed for pain  Please follow-up for increased swelling, increased pain, decreased movement, fevers, chills or if you feel area is not healing

## 2020-10-24 ENCOUNTER — Encounter (HOSPITAL_COMMUNITY): Payer: Self-pay | Admitting: Emergency Medicine

## 2020-10-24 ENCOUNTER — Emergency Department (HOSPITAL_COMMUNITY)
Admission: EM | Admit: 2020-10-24 | Discharge: 2020-10-24 | Disposition: A | Discharge status: Home / Self Care | Payer: BC Managed Care – PPO | Attending: Emergency Medicine | Admitting: Emergency Medicine

## 2020-10-24 ENCOUNTER — Other Ambulatory Visit: Payer: Self-pay

## 2020-10-24 ENCOUNTER — Emergency Department (HOSPITAL_COMMUNITY): Payer: BC Managed Care – PPO

## 2020-10-24 DIAGNOSIS — L03031 Cellulitis of right toe: Secondary | ICD-10-CM | POA: Insufficient documentation

## 2020-10-24 DIAGNOSIS — I2510 Atherosclerotic heart disease of native coronary artery without angina pectoris: Secondary | ICD-10-CM | POA: Insufficient documentation

## 2020-10-24 DIAGNOSIS — X58XXXA Exposure to other specified factors, initial encounter: Secondary | ICD-10-CM | POA: Insufficient documentation

## 2020-10-24 DIAGNOSIS — S99921A Unspecified injury of right foot, initial encounter: Secondary | ICD-10-CM | POA: Diagnosis present

## 2020-10-24 DIAGNOSIS — L0390 Cellulitis, unspecified: Secondary | ICD-10-CM

## 2020-10-24 DIAGNOSIS — R110 Nausea: Secondary | ICD-10-CM | POA: Diagnosis not present

## 2020-10-24 DIAGNOSIS — Z7982 Long term (current) use of aspirin: Secondary | ICD-10-CM | POA: Insufficient documentation

## 2020-10-24 DIAGNOSIS — S91101A Unspecified open wound of right great toe without damage to nail, initial encounter: Secondary | ICD-10-CM | POA: Insufficient documentation

## 2020-10-24 DIAGNOSIS — Z87891 Personal history of nicotine dependence: Secondary | ICD-10-CM | POA: Diagnosis not present

## 2020-10-24 DIAGNOSIS — I10 Essential (primary) hypertension: Secondary | ICD-10-CM | POA: Insufficient documentation

## 2020-10-24 LAB — CBC WITH DIFFERENTIAL/PLATELET
Abs Immature Granulocytes: 0.03 10*3/uL (ref 0.00–0.07)
Basophils Absolute: 0.1 10*3/uL (ref 0.0–0.1)
Basophils Relative: 1 %
Eosinophils Absolute: 0.2 10*3/uL (ref 0.0–0.5)
Eosinophils Relative: 1 %
HCT: 41.3 % (ref 36.0–46.0)
Hemoglobin: 13.4 g/dL (ref 12.0–15.0)
Immature Granulocytes: 0 %
Lymphocytes Relative: 34 %
Lymphs Abs: 3.5 10*3/uL (ref 0.7–4.0)
MCH: 28.2 pg (ref 26.0–34.0)
MCHC: 32.4 g/dL (ref 30.0–36.0)
MCV: 86.9 fL (ref 80.0–100.0)
Monocytes Absolute: 1.1 10*3/uL — ABNORMAL HIGH (ref 0.1–1.0)
Monocytes Relative: 10 %
Neutro Abs: 5.6 10*3/uL (ref 1.7–7.7)
Neutrophils Relative %: 54 %
Platelets: 239 10*3/uL (ref 150–400)
RBC: 4.75 MIL/uL (ref 3.87–5.11)
RDW: 13.1 % (ref 11.5–15.5)
WBC: 10.4 10*3/uL (ref 4.0–10.5)
nRBC: 0 % (ref 0.0–0.2)

## 2020-10-24 LAB — BASIC METABOLIC PANEL
Anion gap: 7 (ref 5–15)
BUN: 14 mg/dL (ref 8–23)
CO2: 27 mmol/L (ref 22–32)
Calcium: 9.4 mg/dL (ref 8.9–10.3)
Chloride: 102 mmol/L (ref 98–111)
Creatinine, Ser: 0.94 mg/dL (ref 0.44–1.00)
GFR, Estimated: >60 mL/min (ref >60)
Glucose, Bld: 94 mg/dL (ref 70–99)
Potassium: 3.9 mmol/L (ref 3.5–5.1)
Sodium: 136 mmol/L (ref 135–145)

## 2020-10-24 IMAGING — DX DG TOE GREAT 2+V*R*
3 series · 3 of 3 positions shown · non-contrast
Comparison: None.

CLINICAL DATA: Cellulitis

EXAM:
RIGHT FIRST TOE: 3 V

[toe ap]
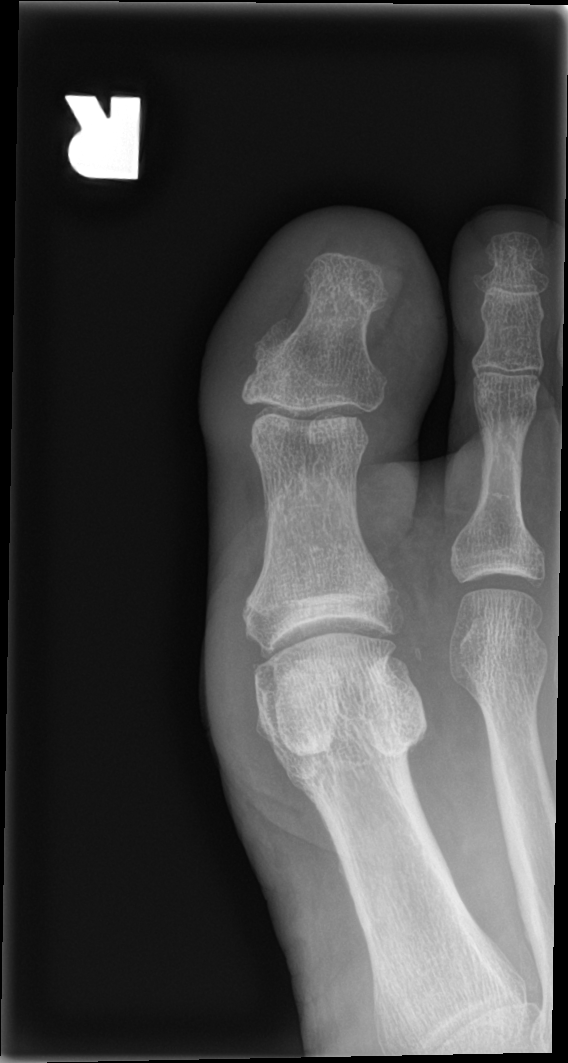

[toe obl]
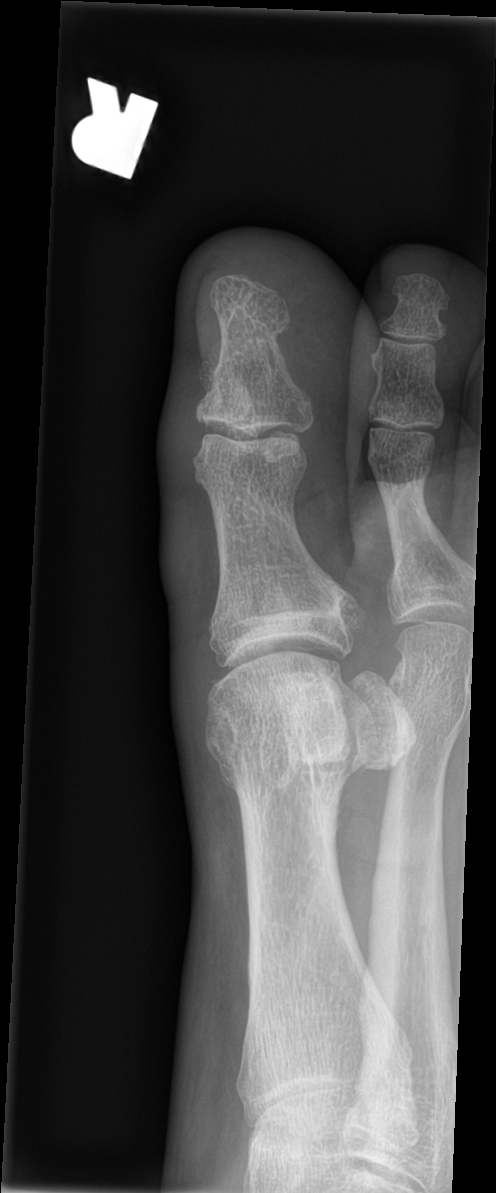

[toe lat]
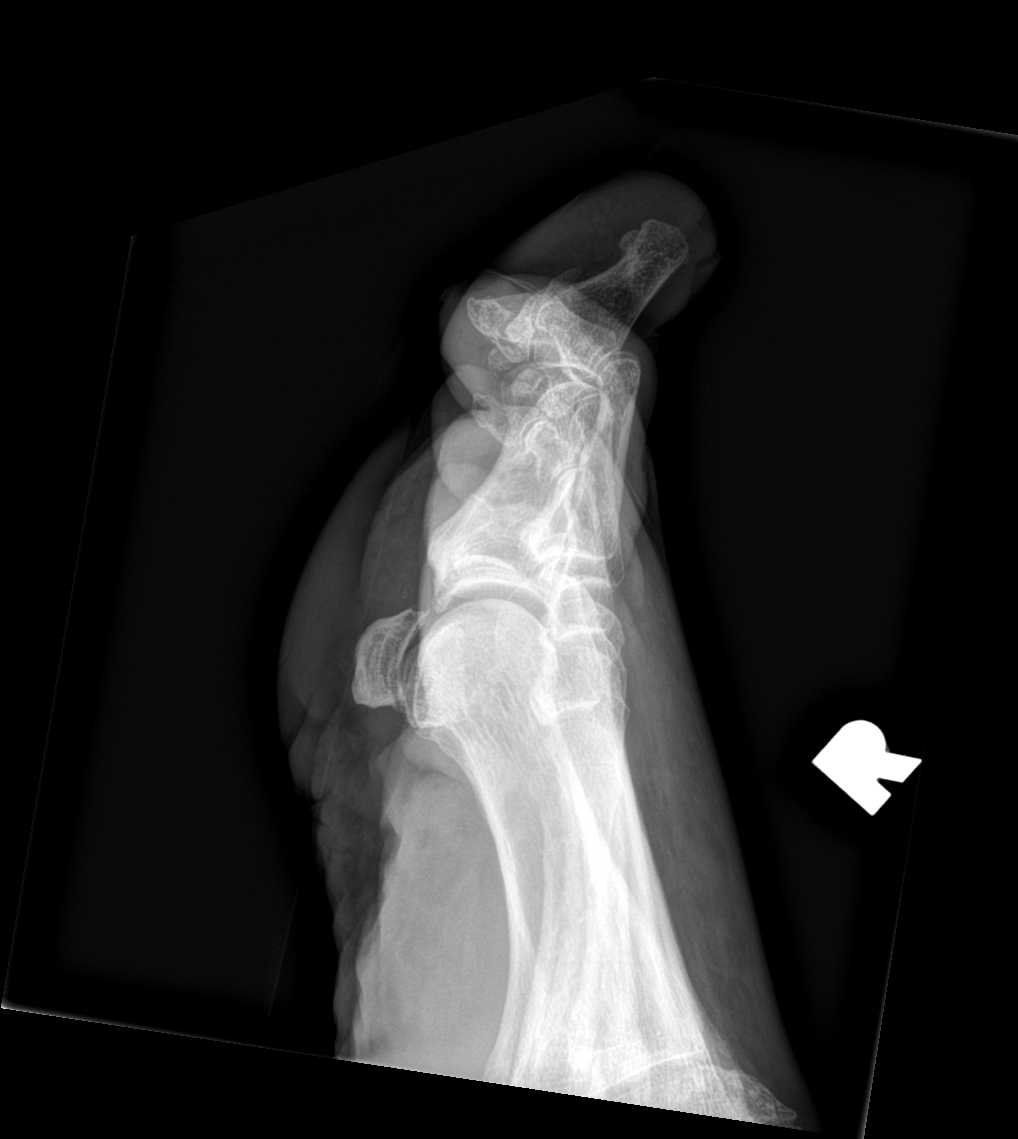

[3 of 3 positions shown; findings below may reference images not displayed]

FINDINGS: Frontal, oblique, and lateral views were obtained. There is soft
tissue swelling. No fracture or dislocation. Joint spaces appear
normal. No erosive change or bony destruction. No soft tissue air or
radiopaque foreign body.
IMPRESSION: Soft tissue swelling. No fracture or dislocation. No bony
destruction or erosion. No appreciable arthropathic change.

## 2020-10-24 MED ORDER — PIPERACILLIN-TAZOBACTAM 3.375 G IVPB 30 MIN
3.3750 g | Freq: Once | INTRAVENOUS | Status: AC
  Administered 2020-10-24: 3.375 g via INTRAVENOUS
  Filled 2020-10-24: qty 50

## 2020-10-24 MED ORDER — ONDANSETRON HCL 4 MG/2ML IJ SOLN
4.0000 mg | Freq: Once | INTRAMUSCULAR | Status: AC
Start: 2020-10-24 — End: 2020-10-24
  Administered 2020-10-24: 4 mg via INTRAVENOUS
  Filled 2020-10-24: qty 2

## 2020-10-24 MED ORDER — DOXYCYCLINE HYCLATE 100 MG PO CAPS: 100.0000 mg | ORAL_CAPSULE | Freq: Two times a day (BID) | ORAL | 0 refills | Status: AC

## 2020-10-24 MED ORDER — ONDANSETRON HCL 4 MG PO TABS: 4.0000 mg | ORAL_TABLET | Freq: Four times a day (QID) | ORAL | 0 refills | Status: DC

## 2020-10-24 MED ORDER — DOXYCYCLINE HYCLATE 100 MG PO CAPS: 100.0000 mg | ORAL_CAPSULE | Freq: Two times a day (BID) | ORAL | 0 refills | Status: DC

## 2020-10-24 MED ORDER — KETOROLAC TROMETHAMINE 30 MG/ML IJ SOLN
30.0000 mg | Freq: Once | INTRAMUSCULAR | Status: AC
  Administered 2020-10-24: 30 mg via INTRAVENOUS
  Filled 2020-10-24: qty 1

## 2020-10-24 NOTE — ED Provider Notes (Signed)
[facility] EMERGENCY DEPARTMENT Provider Note   CSN: [id number] Arrival date & time: 10/24/20  1907     History Chief Complaint  Patient presents with  . Cellulitis     [name] is a 60 y.o. female.  HPI   60 year old female with a history of CAD, hyperlipidemia, hypertension, metabolic syndrome, MI, pneumonia shortness of breath, unstable angina, who presents emergency department today for evaluation of right great toe pain.  States that she started having redness and pain to the right great toe about 5 days ago.  She was seen at urgent care 4 days ago and was diagnosed with cellulitis.  She is started on Keflex.  She states that initially she had some streaking up the leg and this is since improved however she has continued to have redness and pain to the right toe is also had some drainage.  She said no fevers or chills but she has had some nausea at home.  She came here for further evaluation due to persistent symptoms. Pain constant and severe in nature.   Past Medical History:  Diagnosis Date  . CAD (coronary artery disease), premature-cutting balloon athrectomy LAD 2004, 3 DES-Promus to Prox LCX 2012   04/02/2012  . Coronary artery disease   . Hyperlipidemia LDL goal < 70 04/02/2012  . Hypertension   . Metabolic syndrome 04/03/2012  . Morbid obesity with BMI of 40.0-44.9, adult (HCC) 04/02/2012  . Myocardial infarct (HCC)   . Pneumonia    hx of in 03/2010  . Shortness of breath   . Unstable angina (HCC) 04/02/2012    Patient Active Problem List   Diagnosis Date Noted  . Chest pain 08/16/2017  . History of MI (myocardial infarction)   . Class 2 severe obesity with serious comorbidity in adult [facility])   . Metabolic syndrome 04/03/2012  . HTN (hypertension) 04/03/2012  . Family history of coronary artery disease 04/03/2012  . Unstable angina (HCC) 04/02/2012  . CAD (coronary artery disease), premature;  cutting balloon athrectomy LAD 2004, 3 DES-Promus to Prox  LCX 2012   04/02/2012  . Morbid obesity with BMI of 40.0-44.9, adult (HCC) 04/02/2012  . Hyperlipidemia 04/02/2012  . 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 ABDOMINAL HYSTERECTOMY    . ANKLE SURGERY    . CORONARY ANGIOPLASTY    . LEFT HEART CATHETERIZATION WITH CORONARY ANGIOGRAM N/A 04/03/2012   Procedure: LEFT HEART CATHETERIZATION WITH CORONARY ANGIOGRAM;  Surgeon: Lennette Bihari, MD;  Location: [facility] CATH LAB;  Service: Cardiovascular;  Laterality: N/A;     OB History   No obstetric history on file.     Family History  Problem Relation Age of Onset  . Coronary artery disease Mother   . Cancer - Ovarian Mother   . Coronary artery disease Father   . Heart attack Brother     Social History   Tobacco Use  . Smoking status: Former Smoker    Quit date: 06/12/1990    Years since quitting: 30.3  . Smokeless tobacco: Never Used  Vaping Use  . Vaping Use: Never used  Substance Use Topics  . Alcohol use: No  . Drug use: No    Home Medications Prior to Admission medications   Medication Sig Start Date End Date Taking? Authorizing Provider  doxycycline (VIBRAMYCIN) 100 MG capsule Take 1 capsule (100 mg total) by mouth 2 (two) times daily for 7 days. 10/24/20 10/31/20 Yes Prentis Langdon S, PA-C  acetaminophen (TYLENOL) 500 MG tablet Take 1,000 mg by mouth every 6 (six) hours as needed. For pain    [provider]  albuterol (VENTOLIN HFA) 108 (90 Base) MCG/ACT inhaler Inhale 2 puffs into the lungs every 6 (six) hours as needed for wheezing or shortness of breath. 06/08/20   Elson Areas, PA-C  aspirin 81 MG tablet Take 1 tablet (81 mg total) by mouth daily. 08/17/17   Shirley, [place], DO  atorvastatin (LIPITOR) 80 MG tablet Take 1 tablet (80 mg total) by mouth daily at 6 PM. Patient not taking: Reported on 10/21/2020 08/17/17   Shirley, [place], DO  cephALEXin (KEFLEX) 500 MG capsule Take 2 capsules (1,000 mg total) by mouth 2  (two) times daily. 10/21/20   White, Elita Boone, NP  nitroGLYCERIN (NITROSTAT) 0.4 MG SL tablet Place 0.4 mg under the tongue every 5 (five) minutes as needed. For chest pain    [provider]  nitroGLYCERIN (NITROSTAT) 0.4 MG SL tablet Place 1 tablet (0.4 mg total) under the tongue every 5 (five) minutes as needed for chest pain. 08/17/17 08/17/18  Marthenia Rolling, DO    Allergies    Niacin and related and Penicillins  Review of Systems   Review of Systems  Constitutional: Negative for fever.  HENT: Negative for ear pain and sore throat.   Eyes: Negative for visual disturbance.  Respiratory: Negative for cough and shortness of breath.   Cardiovascular: Negative for chest pain.  Gastrointestinal: Positive for nausea. Negative for abdominal pain, constipation, diarrhea and vomiting.  Genitourinary: Negative for flank pain.  Musculoskeletal: Negative for back pain.  Skin: Positive for color change and wound.  Neurological: Negative for weakness and numbness.  All other systems reviewed and are negative.   Physical Exam Updated Vital Signs BP (!) 157/90   Pulse 95   Temp 98.4 F (36.9 C)   Resp 16   Ht 5\' 7"  (1.702 m)   Wt 97.5 kg   SpO2 98%   BMI 33.67 kg/m   Physical Exam Vitals and nursing note reviewed.  Constitutional:      General: She is not in acute distress.    Appearance: She is well-developed.  HENT:     Head: Normocephalic and atraumatic.  Eyes:     Conjunctiva/sclera: Conjunctivae normal.  Cardiovascular:     Rate and Rhythm: Normal rate.  Pulmonary:     Effort: Pulmonary effort is normal.  Abdominal:     Palpations: Abdomen is soft.  Musculoskeletal:     Cervical back: Neck supple.  Skin:    General: Skin is warm and dry.     Comments: Superficial wound noted to the dorsum of the right great toe with some mild surrounding erythema that encompasses about a 2 x 2 centimeter area.  There is no streaking or tracking up the leg.  There is no edema,  fluctuance or crepitus noted.  There is no obvious purulent drainage.  The base of the toe does not have any tenderness or erythema.  Neurological:     Mental Status: She is alert.     ED Results / Procedures / Treatments   Labs (all labs ordered are listed, but only abnormal results are displayed) Labs Reviewed  CBC WITH DIFFERENTIAL/PLATELET - Abnormal; Notable for the following components:      Result Value   Monocytes Absolute 1.1 (*)    All other components within normal limits  BASIC METABOLIC PANEL    EKG None  Radiology DG Toe  Great Right  Result Date: 10/24/2020 CLINICAL DATA:  Cellulitis EXAM: RIGHT FIRST TOE: 3 V COMPARISON:  None. FINDINGS: Frontal, oblique, and lateral views were obtained. There is soft tissue swelling. No fracture or dislocation. Joint spaces appear normal. No erosive change or bony destruction. No soft tissue air or radiopaque foreign body. IMPRESSION: Soft tissue swelling. No fracture or dislocation. No bony destruction or erosion. No appreciable arthropathic change. Electronically Signed   By: Bretta Bang III M.D.   On: 10/24/2020 22:48    Procedures Procedures   Medications Ordered in ED Medications  piperacillin-tazobactam (ZOSYN) IVPB 3.375 g (0 g Intravenous Stopped 10/24/20 2135)  ondansetron (ZOFRAN) injection 4 mg (4 mg Intravenous Given 10/24/20 2039)  ketorolac (TORADOL) 30 MG/ML injection 30 mg (30 mg Intravenous Given 10/24/20 2039)    ED Course  I have reviewed the triage vital signs and the nursing notes.  Pertinent labs & imaging results that were available during my care of the patient were reviewed by me and considered in my medical decision making (see chart for details).    MDM Rules/Calculators/A&P                          60 year old female presents to the emergency department today for evaluation of a wound to the right great toe that started after she got a new pair of shoes and had some irritation to the skin.   She has been on Keflex and has had some improvement of symptoms but has not had any resolution therefore came for further evaluation.  Reviewed/interpreted labs CBC is without leukocytosis BMP is unremarkable, blood sugar is normal  X-ray of the right great toe - Soft tissue swelling. No fracture or dislocation. No bony destruction or erosion. No appreciable arthropathic change  No evidence of osteomyelitis on xray and have very low suspicion for this. Cellulitis is already improving on keflex but we will add doxy to broaded her coverage. will have pt f/u closely with pcp or rter for new or worsening symptoms. Will also refer to wound clinic. Advised on f/u and return precautions. She voices understanding and is in agreement with the plan. All questions answered, pt stable for discharge.   Dr Deretha Emory personally evaluated the pt and is in agreement with the plan.   Final Clinical Impression(s) / ED Diagnoses Final diagnoses:  Cellulitis, unspecified cellulitis site    Rx / DC Orders ED Discharge Orders         Ordered    doxycycline (VIBRAMYCIN) 100 MG capsule  2 times daily        10/24/20 2251           Karrie Meres, PA-C 10/24/20 2253    Vanetta Mulders, MD 10/27/20 [phone]

## 2020-10-24 NOTE — Discharge Instructions (Addendum)
Take doxycycline and keflex as directed.   Please follow up in 48 hours for wound recheck. You were given a referral to the wound care center. Please make an appointment for follow up. Please return to the emergency department for any new or worsening symptoms.

## 2020-10-24 NOTE — ED Triage Notes (Addendum)
Pt states that she has cellulitis of the great right toe on 10/21/20 at [facility]. States she received abx and was told to come back to ER if it wasn't better by today.

## 2021-03-11 DIAGNOSIS — H5203 Hypermetropia, bilateral: Secondary | ICD-10-CM | POA: Diagnosis not present

## 2021-06-03 ENCOUNTER — Emergency Department (HOSPITAL_COMMUNITY): Payer: 59

## 2021-06-03 ENCOUNTER — Encounter (HOSPITAL_COMMUNITY): Payer: Self-pay

## 2021-06-03 ENCOUNTER — Emergency Department (HOSPITAL_COMMUNITY)
Admission: EM | Admit: 2021-06-03 | Discharge: 2021-06-03 | Disposition: A | Discharge status: Home / Self Care | Payer: 59 | Attending: Emergency Medicine | Admitting: Emergency Medicine

## 2021-06-03 DIAGNOSIS — Z7982 Long term (current) use of aspirin: Secondary | ICD-10-CM | POA: Diagnosis not present

## 2021-06-03 DIAGNOSIS — S8002XA Contusion of left knee, initial encounter: Secondary | ICD-10-CM | POA: Diagnosis not present

## 2021-06-03 DIAGNOSIS — W108XXA Fall (on) (from) other stairs and steps, initial encounter: Secondary | ICD-10-CM | POA: Insufficient documentation

## 2021-06-03 DIAGNOSIS — M533 Sacrococcygeal disorders, not elsewhere classified: Secondary | ICD-10-CM | POA: Diagnosis not present

## 2021-06-03 DIAGNOSIS — I2510 Atherosclerotic heart disease of native coronary artery without angina pectoris: Secondary | ICD-10-CM | POA: Insufficient documentation

## 2021-06-03 DIAGNOSIS — Z87891 Personal history of nicotine dependence: Secondary | ICD-10-CM | POA: Diagnosis not present

## 2021-06-03 DIAGNOSIS — I10 Essential (primary) hypertension: Secondary | ICD-10-CM | POA: Insufficient documentation

## 2021-06-03 DIAGNOSIS — W19XXXA Unspecified fall, initial encounter: Secondary | ICD-10-CM

## 2021-06-03 DIAGNOSIS — S39012A Strain of muscle, fascia and tendon of lower back, initial encounter: Secondary | ICD-10-CM | POA: Insufficient documentation

## 2021-06-03 DIAGNOSIS — Z043 Encounter for examination and observation following other accident: Secondary | ICD-10-CM | POA: Diagnosis not present

## 2021-06-03 DIAGNOSIS — S3992XA Unspecified injury of lower back, initial encounter: Secondary | ICD-10-CM | POA: Diagnosis present

## 2021-06-03 DIAGNOSIS — M545 Low back pain, unspecified: Secondary | ICD-10-CM | POA: Diagnosis not present

## 2021-06-03 IMAGING — DX DG LUMBAR SPINE COMPLETE 4+V
5 series · 5 of 5 positions shown · non-contrast
Comparison: None.

CLINICAL DATA: Fall, pain

EXAM:
LUMBAR SPINE - COMPLETE 4+ VIEW

[l-spine ap]
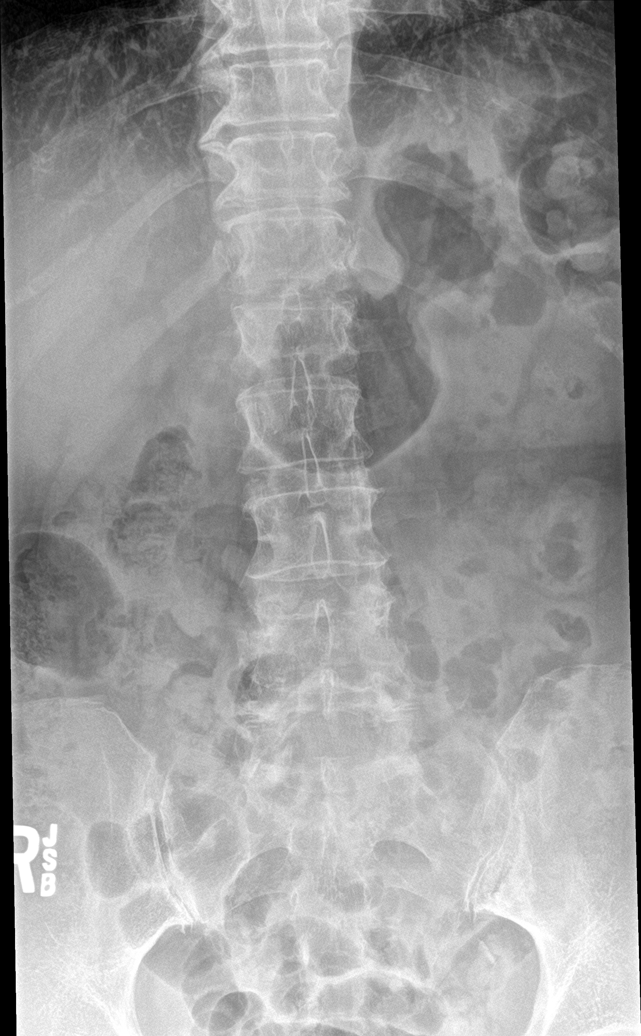

[l-spine obl (1 of 2)]
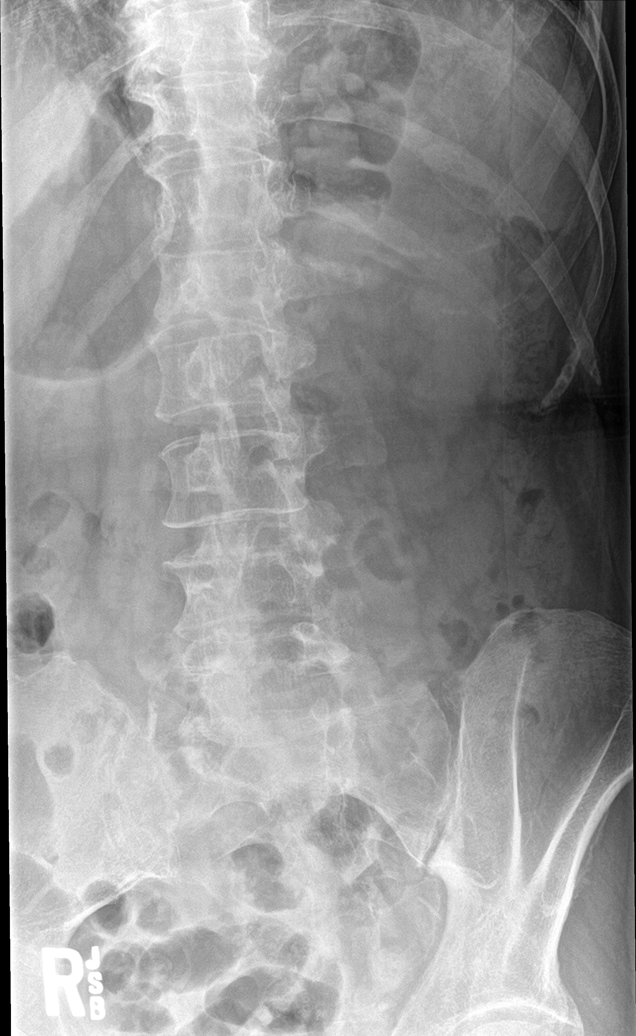

[l-spine obl (2 of 2)]
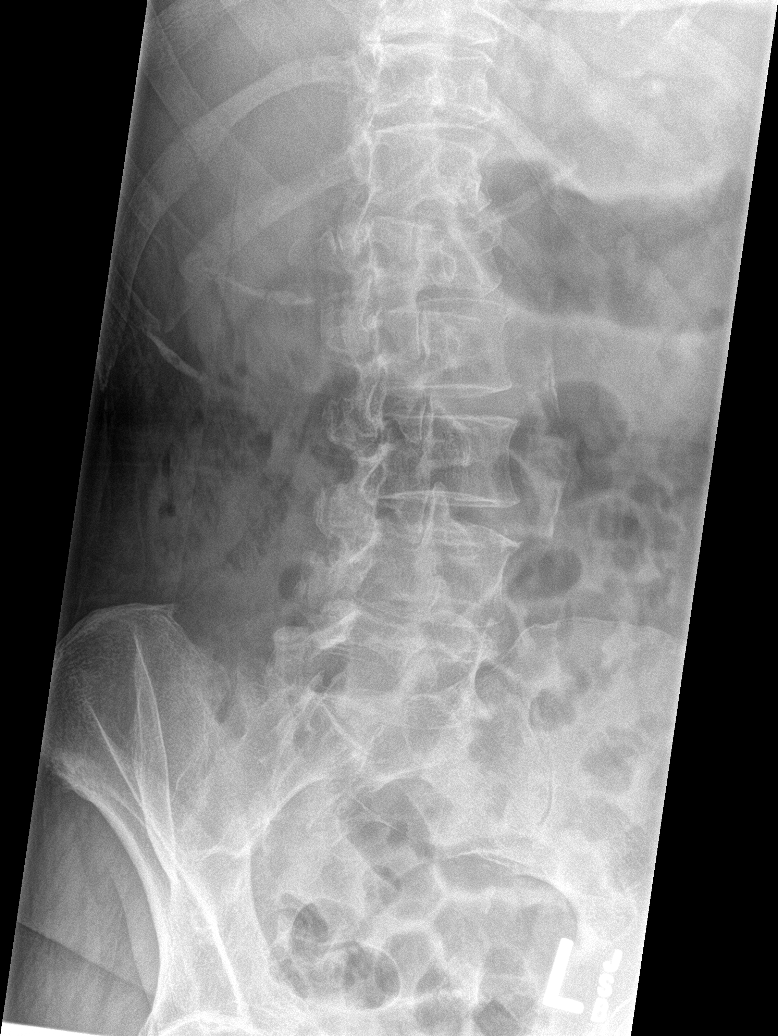

[l-spine lat]
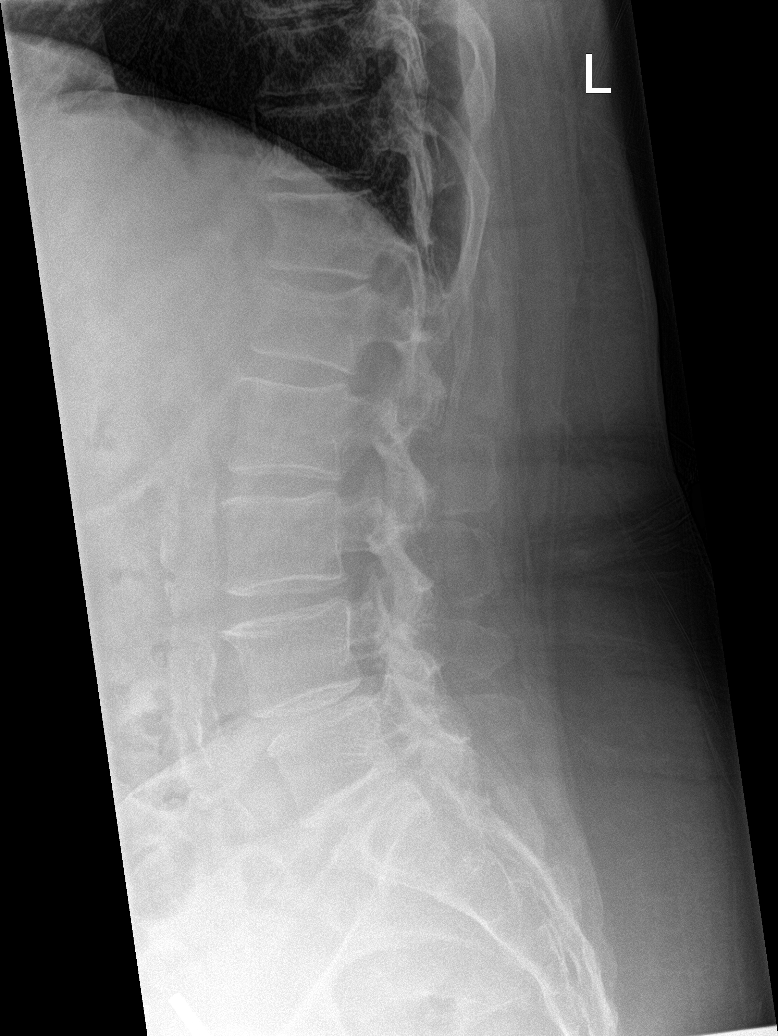

[l-spine spot]
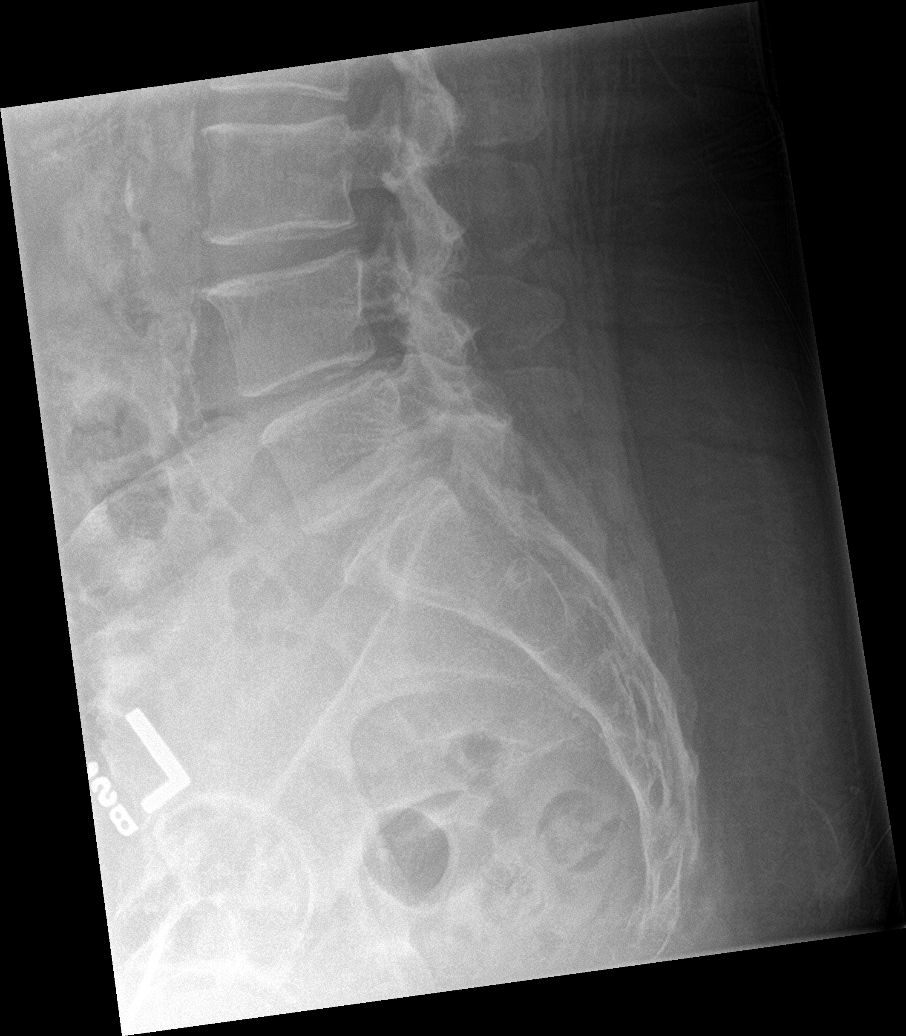

[5 of 5 positions shown; findings below may reference images not displayed]

FINDINGS: There is no evidence of lumbar spine fracture. Alignment is normal.
Mild multilevel DA and disc disease with disc space narrowing and
marginal osteophytes. Facet joint arthropathy at L4-L5 and L5-S1.
IMPRESSION: No acute fracture or subluxation.

## 2021-06-03 IMAGING — DX DG SACRUM/COCCYX 2+V
3 series · 3 of 3 positions shown · non-contrast
Comparison: None.

CLINICAL DATA: Fall, low back and left knee pain, initial
encounter.

EXAM:
SACRUM AND COCCYX - 2+ VIEW

[coccyx ap]
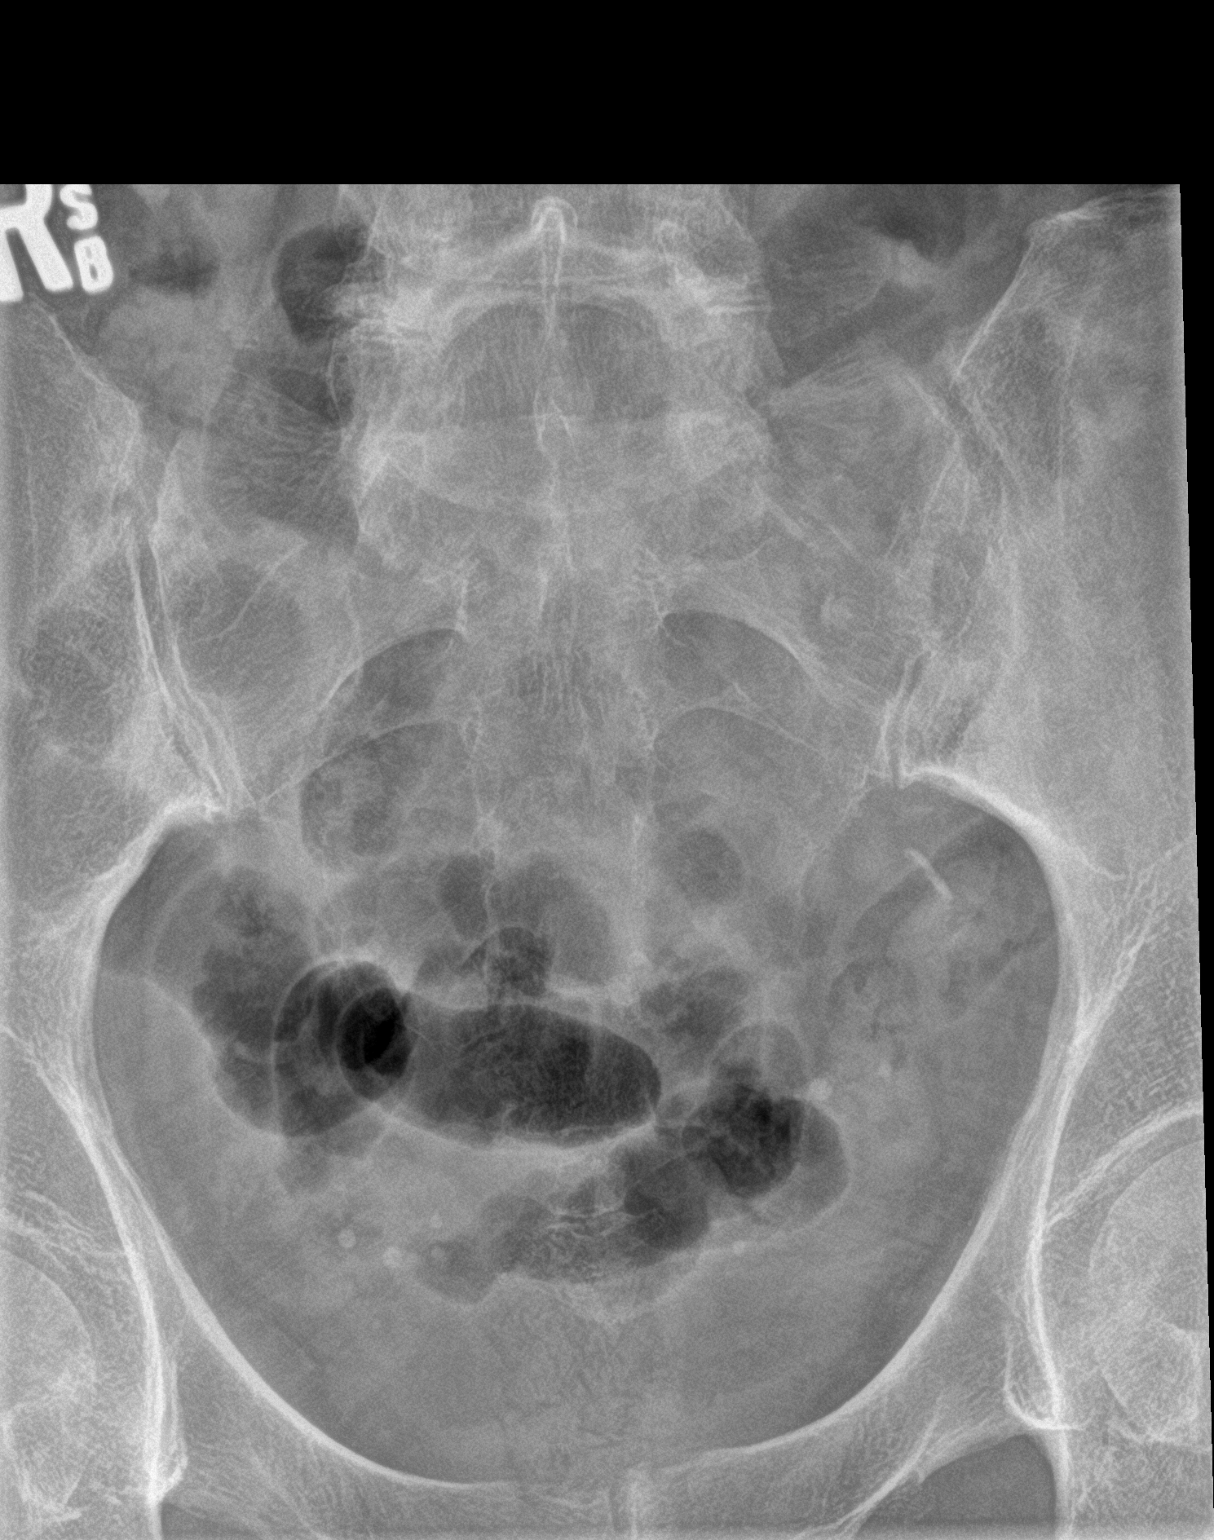

[sacrum ap]
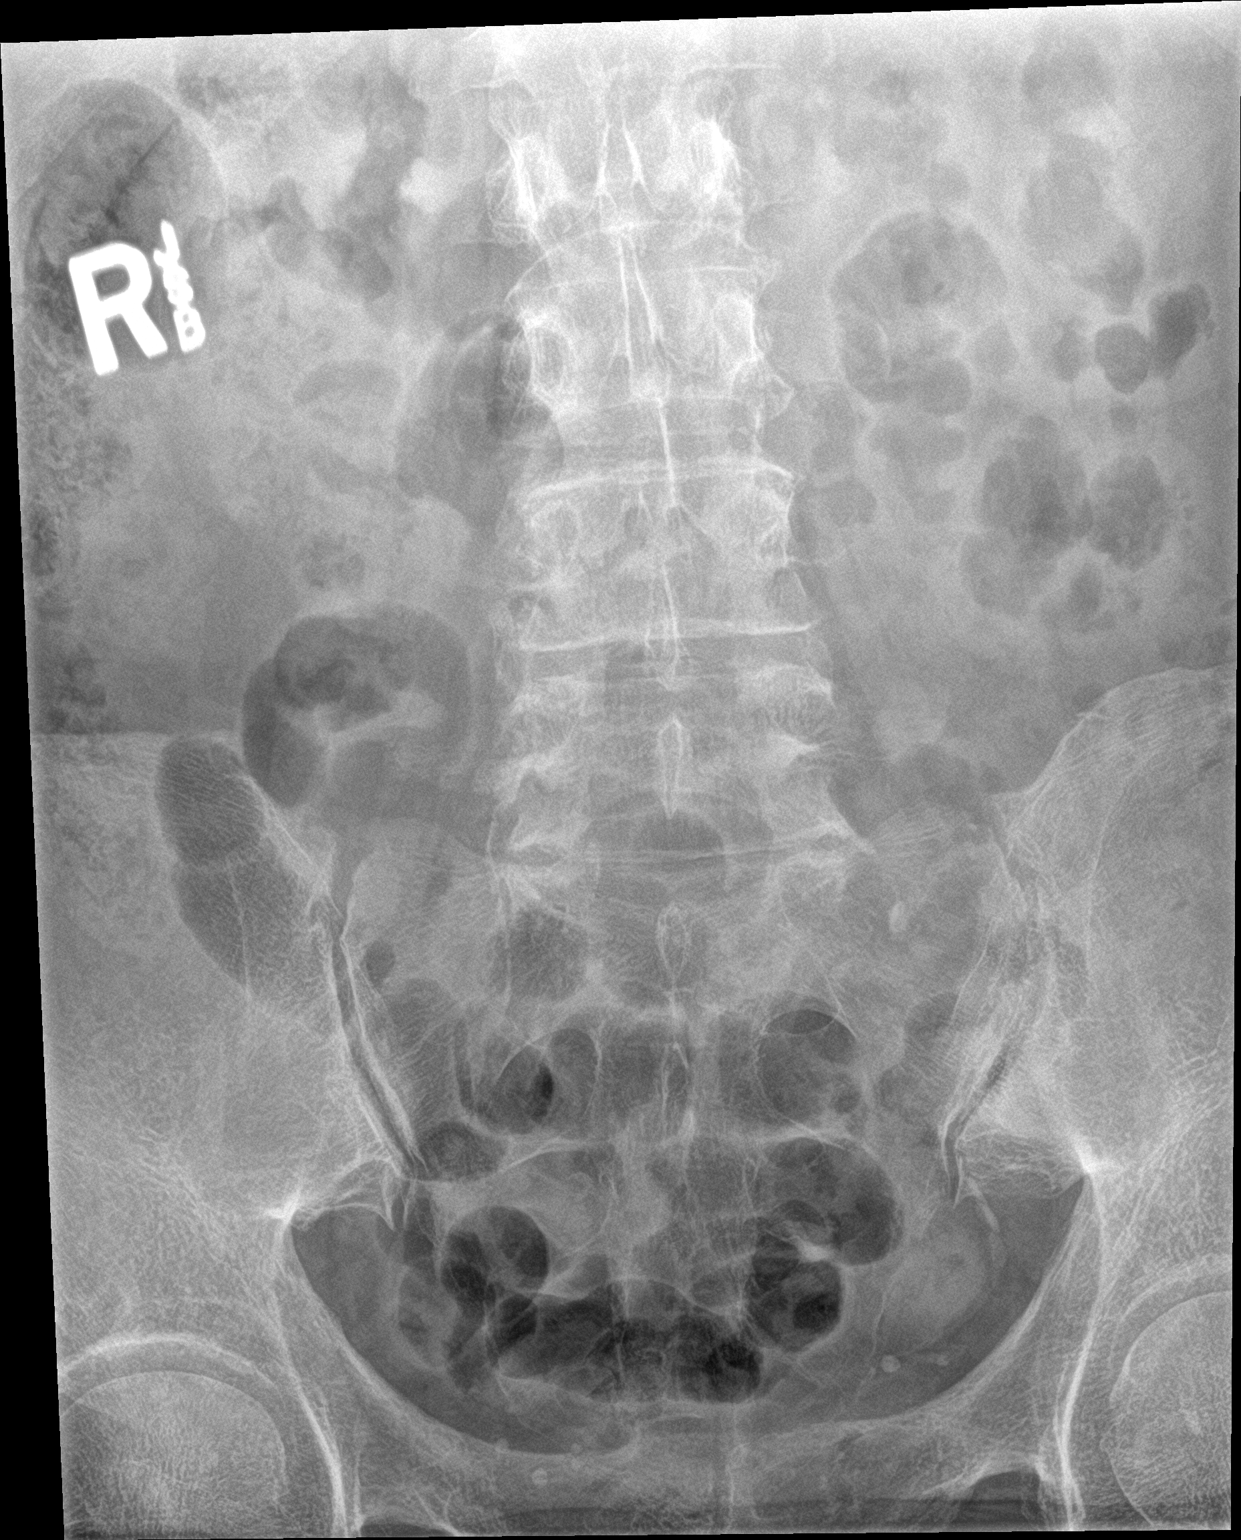

[sacrum lat]
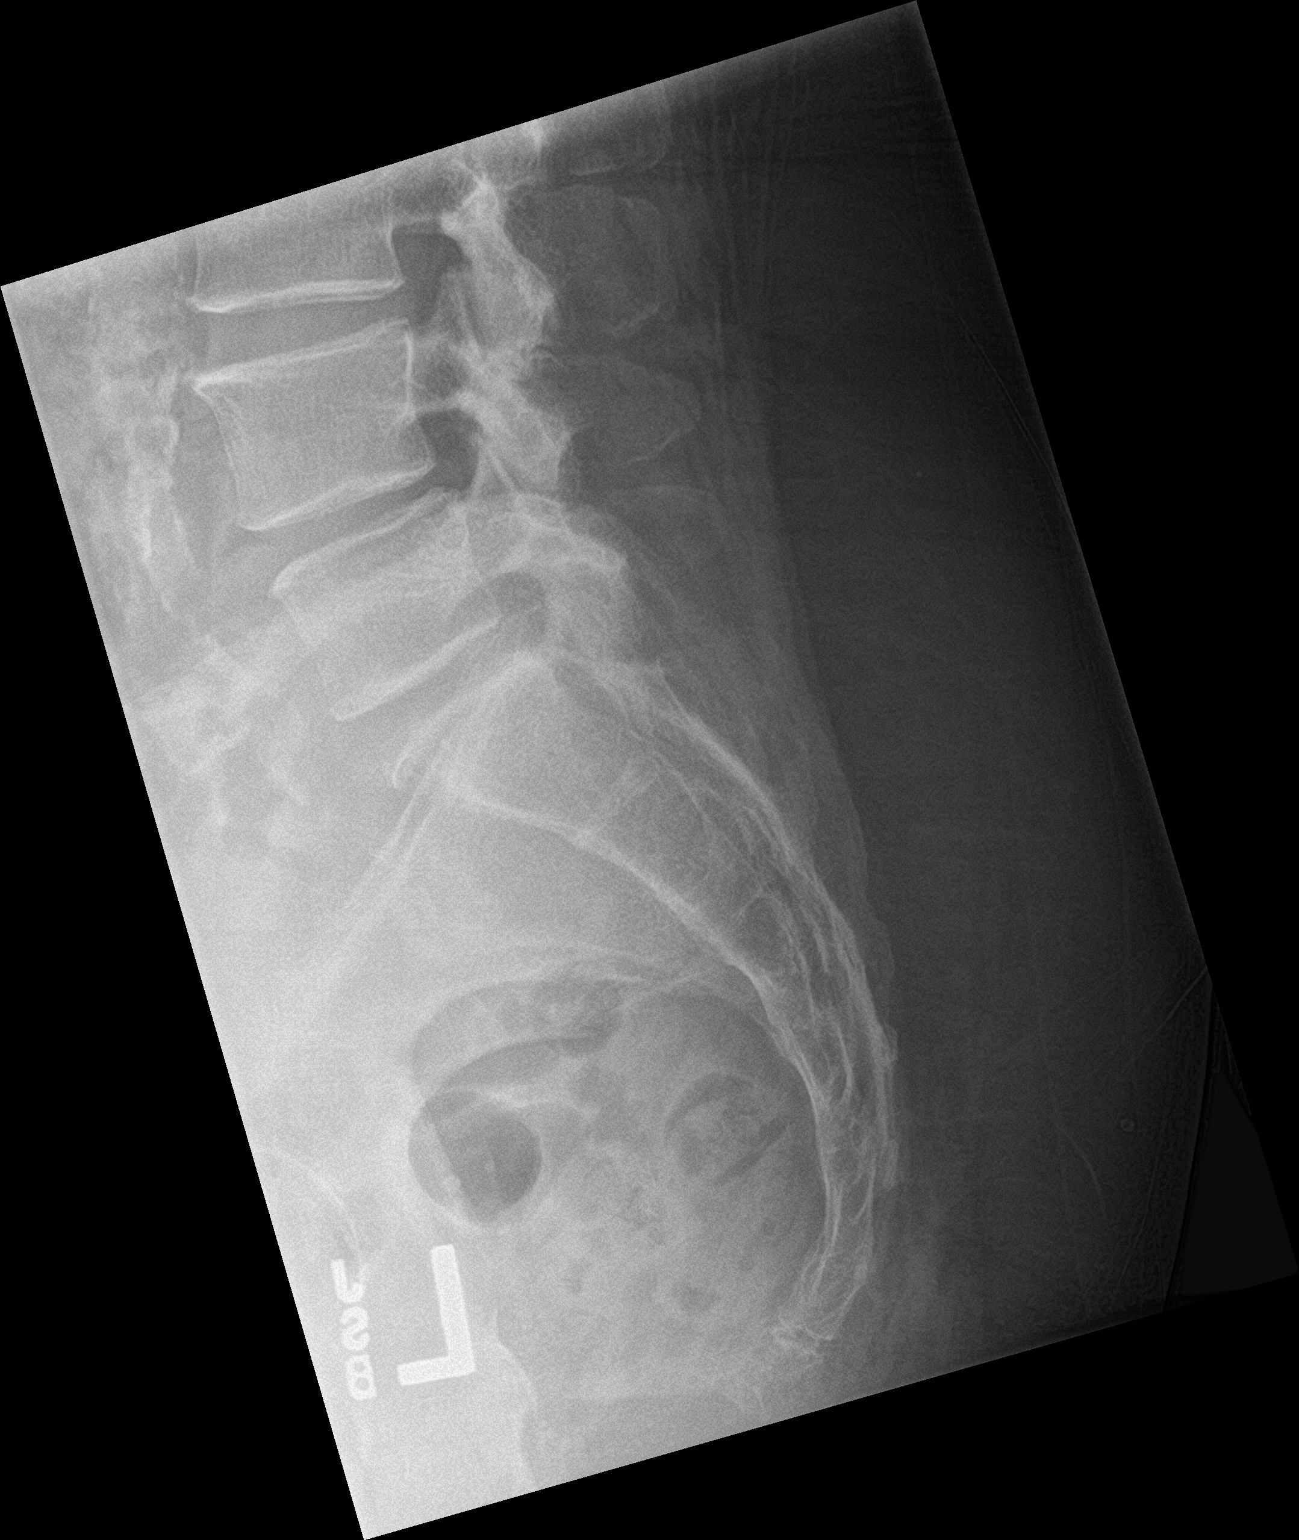

[3 of 3 positions shown; findings below may reference images not displayed]

FINDINGS: No acute osseous abnormality.
IMPRESSION: No acute osseous abnormality.

## 2021-06-03 IMAGING — DX DG KNEE COMPLETE 4+V*L*
4 series · 4 of 4 positions shown · non-contrast
Comparison: None.

CLINICAL DATA: Fall

EXAM:
LEFT KNEE - COMPLETE 4+ VIEW

[knee ap]
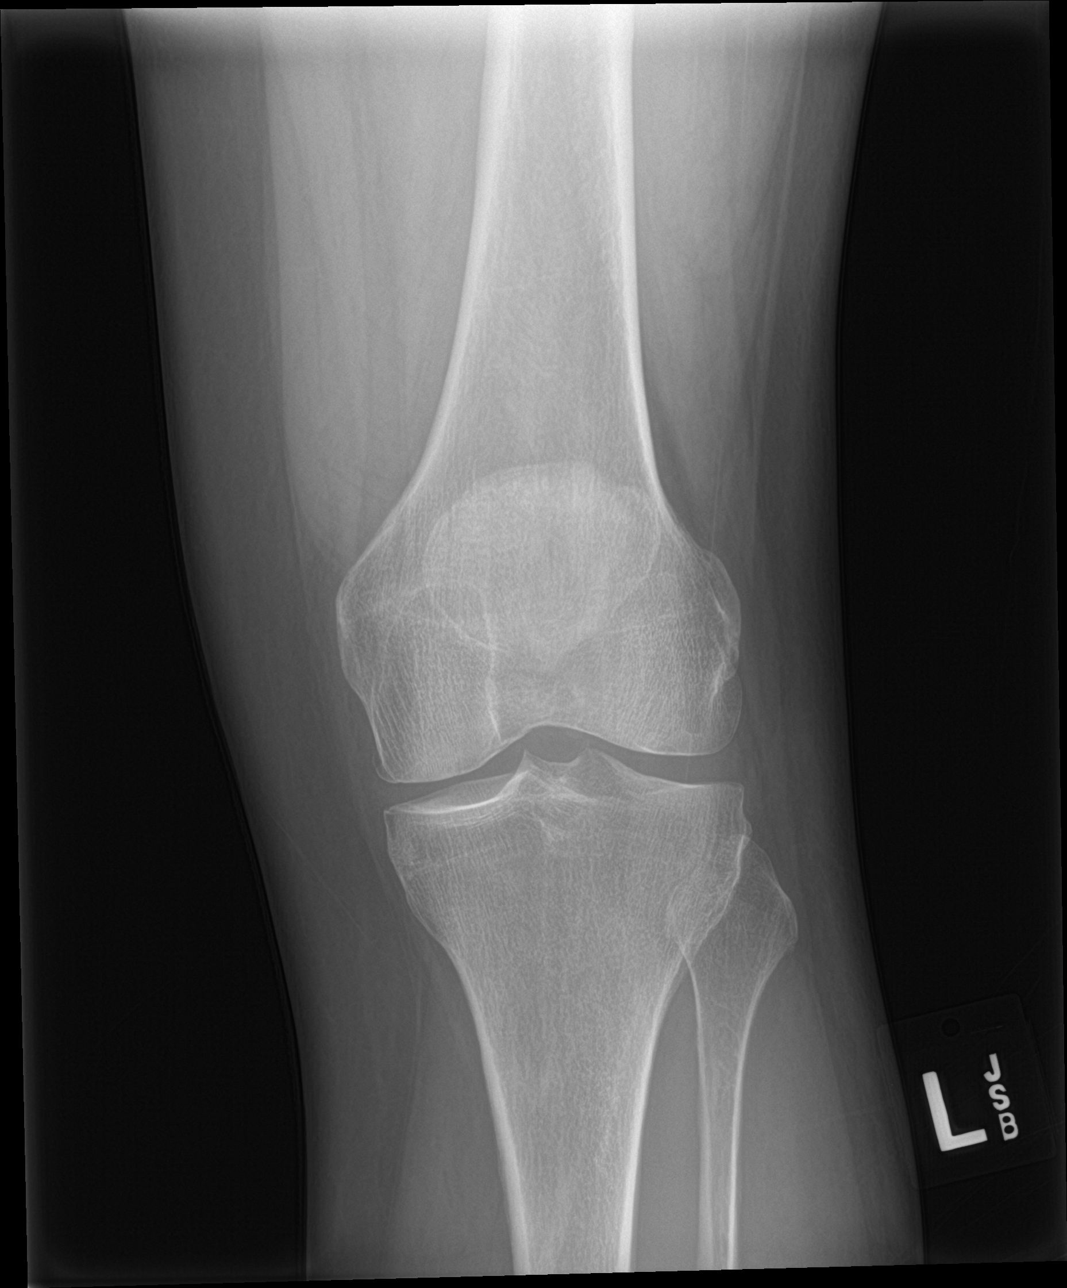

[knee obl (1 of 2)]
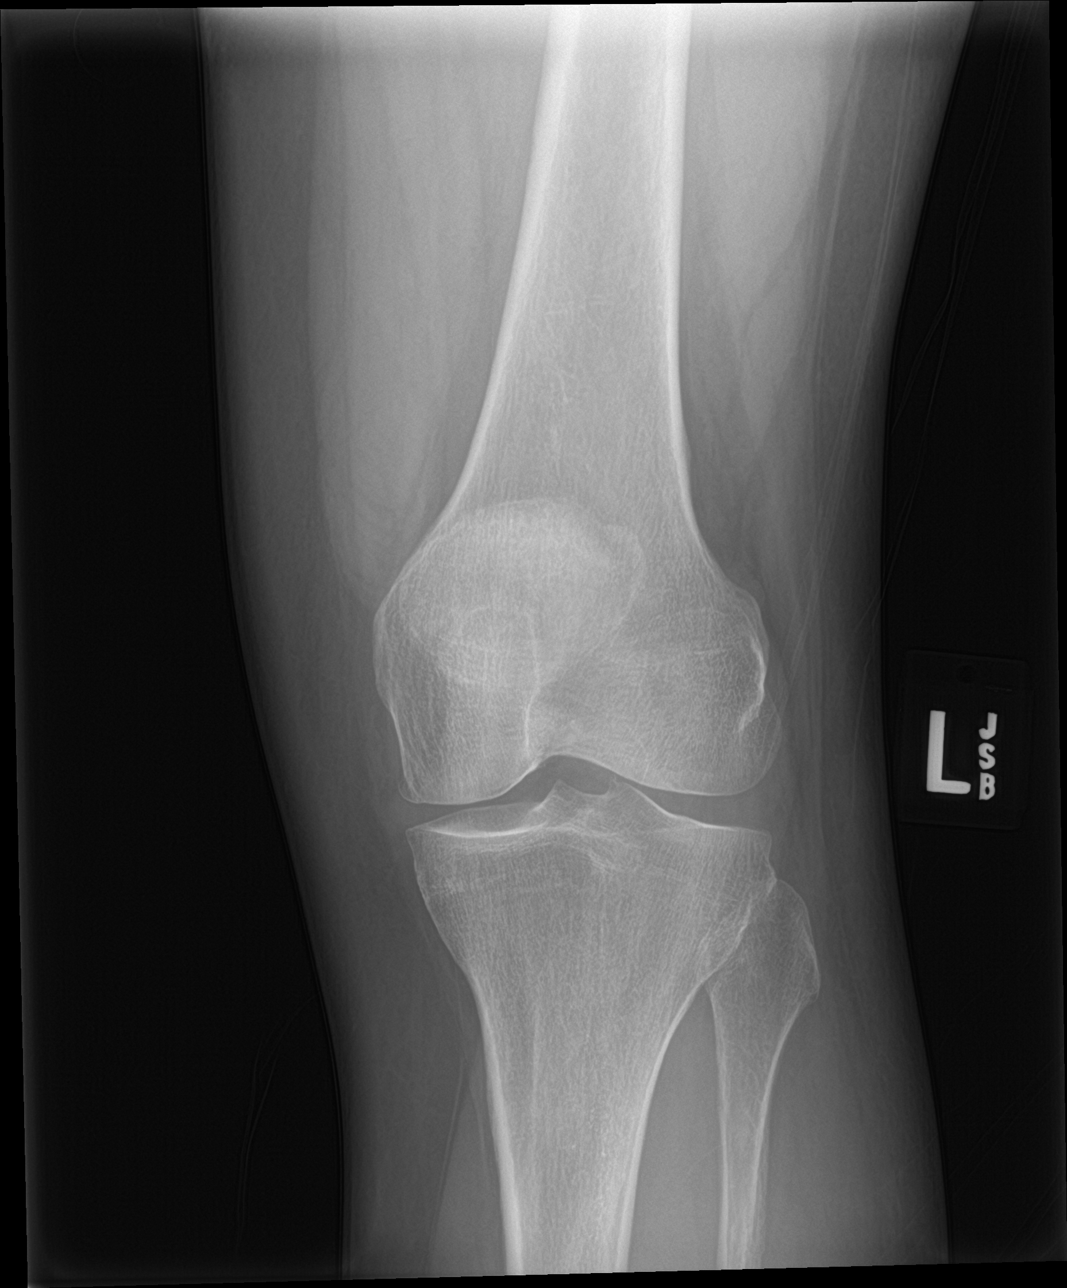

[knee obl (2 of 2)]
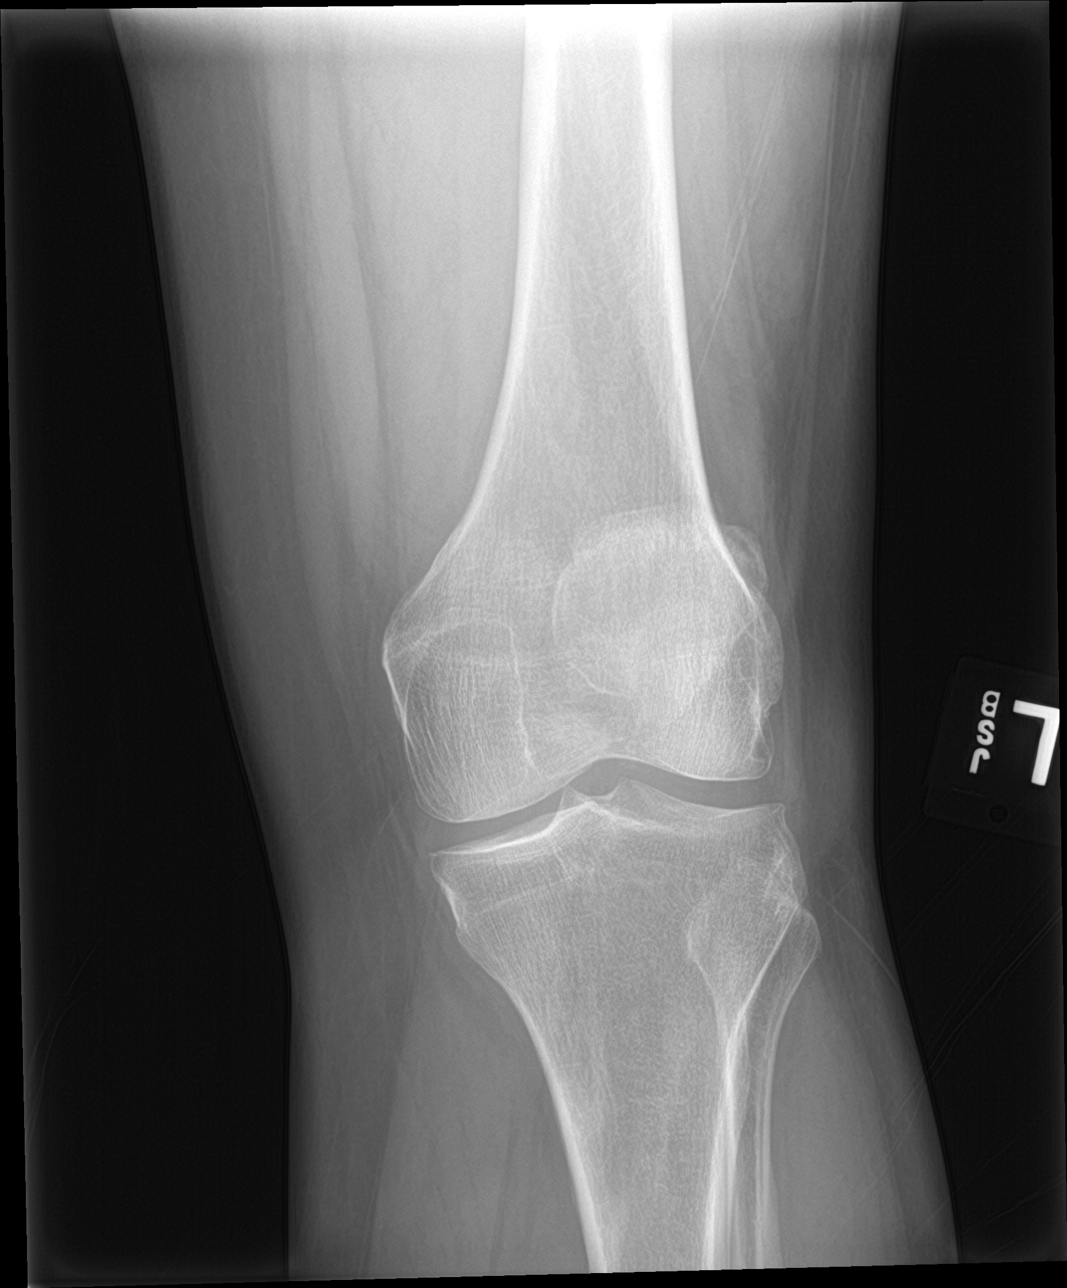

[knee lat]
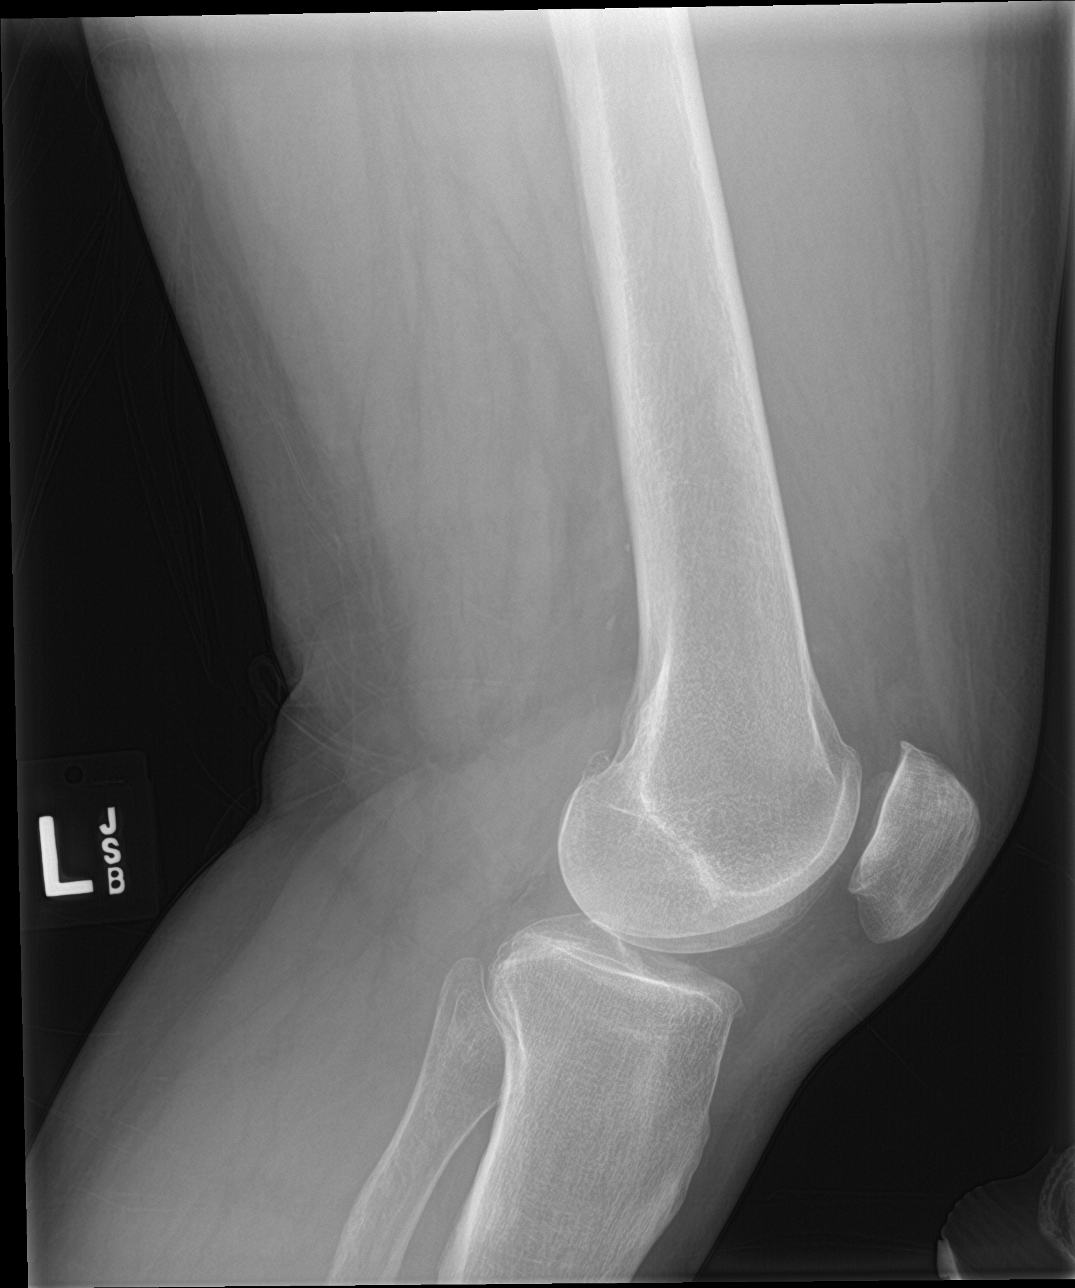

[4 of 4 positions shown; findings below may reference images not displayed]

FINDINGS: There is no acute fracture or dislocation. Knee alignment is normal.
The joint spaces are preserved. The soft tissues are unremarkable.
There is no effusion.
IMPRESSION: No acute fracture or dislocation.

## 2021-06-03 MED ORDER — DIAZEPAM 5 MG PO TABS: 5.0000 mg | ORAL_TABLET | Freq: Two times a day (BID) | ORAL | 0 refills | Status: DC

## 2021-06-03 MED ORDER — MORPHINE SULFATE (PF) 4 MG/ML IV SOLN
4.0000 mg | Freq: Once | INTRAVENOUS | Status: AC
  Administered 2021-06-03: 09:00:00 4 mg via INTRAVENOUS
  Filled 2021-06-03: qty 1

## 2021-06-03 MED ORDER — HYDROCODONE-ACETAMINOPHEN 5-325 MG PO TABS: 1.0000 | ORAL_TABLET | ORAL | 0 refills | Status: DC | PRN

## 2021-06-03 MED ORDER — LORAZEPAM 2 MG/ML IJ SOLN
1.0000 mg | Freq: Once | INTRAMUSCULAR | Status: AC
  Administered 2021-06-03: 09:00:00 1 mg via INTRAVENOUS
  Filled 2021-06-03: qty 1

## 2021-06-03 NOTE — ED Triage Notes (Signed)
Pt fell this AM outside stairs landed on her back, complain of midlower back pain, left knee pain. Pt took flexeril and Zofran prior to arrival.  10/10 pain, no LOC, no blood thinners

## 2021-06-03 NOTE — ED Notes (Signed)
Patient transported to X-ray 

## 2021-06-03 NOTE — ED Provider Notes (Signed)
[facility] EMERGENCY DEPARTMENT Provider Note   CSN: [id number] Arrival date & time: 06/03/21  4332     History Chief Complaint  Patient presents with   [name]    [name] is a 60 y.o. female.  Pt presents to the ED today with low back pain.  Pt said she fell on her outside stairs.  She has a lot of low back pain.  She was able to walk, but it is very painful.  She took a home flexeril and zofran pta.  Pt did not hit her head.  No loc.  No blood thinners.      Past Medical History:  Diagnosis Date   CAD (coronary artery disease), premature-cutting balloon athrectomy LAD 2004, 3 DES-Promus to Prox LCX 2012   04/02/2012   Coronary artery disease    Hyperlipidemia LDL goal < 70 04/02/2012   Hypertension    Metabolic syndrome 04/03/2012   Morbid obesity with BMI of 40.0-44.9, adult (HCC) 04/02/2012   Myocardial infarct (HCC)    Pneumonia    hx of in 03/2010   Shortness of breath    Unstable angina (HCC) 04/02/2012    Patient Active Problem List   Diagnosis Date Noted   Chest pain 08/16/2017   History of MI (myocardial infarction)    Class 2 severe obesity with serious comorbidity in adult [facility])    Metabolic syndrome 04/03/2012   HTN (hypertension) 04/03/2012   Family history of coronary artery disease 04/03/2012   Unstable angina (HCC) 04/02/2012   CAD (coronary artery disease), premature;  cutting balloon athrectomy LAD 2004, 3 DES-Promus to Prox LCX 2012   04/02/2012   Morbid obesity with BMI of 40.0-44.9, adult (HCC) 04/02/2012   Hyperlipidemia 04/02/2012   PAINFUL HARDWARE 04/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 CATH LAB;  Service: Cardiovascular;  Laterality: N/A;     OB History    No obstetric history on file.     Family History  Problem Relation Age of Onset   Coronary artery disease Mother    Cancer - Ovarian Mother    Coronary artery disease Father    Heart attack Brother     Social History   Tobacco Use   Smoking status: Former    Types: Cigarettes    Quit date: 06/12/1990    Years since quitting: 30.9   Smokeless tobacco: Never  Vaping Use   Vaping Use: Never used  Substance Use Topics   Alcohol use: No   Drug use: No    Home Medications Prior to Admission medications   Medication Sig Start Date End Date Taking? Authorizing Provider  diazepam (VALIUM) 5 MG tablet Take 1 tablet (5 mg total) by mouth 2 (two) times daily. 06/03/21  Yes Jacalyn Lefevre, MD  HYDROcodone-acetaminophen (NORCO/VICODIN) 5-325 MG tablet Take 1 tablet by mouth every 4 (four) hours as needed. 06/03/21  Yes Jacalyn Lefevre, MD  acetaminophen (TYLENOL) 500 MG tablet Take 1,000 mg by mouth every 6 (six) hours as needed. For pain    [provider]  albuterol (VENTOLIN HFA) 108 (90 Base) MCG/ACT inhaler Inhale 2 puffs into the lungs every 6 (six) hours as needed for wheezing or shortness of breath. 06/08/20  Elson Areas, PA-C  aspirin 81 MG tablet Take 1 tablet (81 mg total) by mouth daily. 08/17/17   Shirley, [place], DO  atorvastatin (LIPITOR) 80 MG tablet Take 1 tablet (80 mg total) by mouth daily at 6 PM. Patient not taking: Reported on 10/21/2020 08/17/17   Shirley, [place], DO  cephALEXin (KEFLEX) 500 MG capsule Take 2 capsules (1,000 mg total) by mouth 2 (two) times daily. 10/21/20   White, Elita Boone, NP  nitroGLYCERIN (NITROSTAT) 0.4 MG SL tablet Place 0.4 mg under the tongue every 5 (five) minutes as needed. For chest pain    [provider]  nitroGLYCERIN (NITROSTAT) 0.4 MG SL tablet Place 1 tablet (0.4 mg total) under the tongue every 5 (five) minutes as needed for chest pain. 08/17/17 08/17/18  Marthenia Rolling, DO  ondansetron (ZOFRAN) 4 MG tablet Take 1  tablet (4 mg total) by mouth every 6 (six) hours. 10/24/20   Couture, Cortni S, PA-C    Allergies    Niacin and related and Penicillins  Review of Systems   Review of Systems  Musculoskeletal:  Positive for back pain.       Left knee pain  All other systems reviewed and are negative.  Physical Exam Updated Vital Signs BP (!) 135/92    Pulse 76    Temp 98.8 F (37.1 C) (Oral)    Resp 18    Ht 5\' 7"  (1.702 m)    Wt 104.3 kg    SpO2 98%    BMI 36.02 kg/m   Physical Exam Vitals and nursing note reviewed.  Constitutional:      Appearance: Normal appearance.  HENT:     Head: Normocephalic and atraumatic.     Right Ear: External ear normal.     Left Ear: External ear normal.     Nose: Nose normal.     Mouth/Throat:     Mouth: Mucous membranes are moist.     Pharynx: Oropharynx is clear.  Eyes:     Extraocular Movements: Extraocular movements intact.     Conjunctiva/sclera: Conjunctivae normal.     Pupils: Pupils are equal, round, and reactive to light.  Cardiovascular:     Rate and Rhythm: Normal rate and regular rhythm.     Pulses: Normal pulses.     Heart sounds: Normal heart sounds.  Pulmonary:     Effort: Pulmonary effort is normal.     Breath sounds: Normal breath sounds.  Abdominal:     General: Abdomen is flat. Bowel sounds are normal.     Palpations: Abdomen is soft.  Musculoskeletal:       Arms:     Cervical back: Normal range of motion and neck supple.  Skin:    General: Skin is warm.     Capillary Refill: Capillary refill takes less than 2 seconds.  Neurological:     General: No focal deficit present.     Mental Status: She is alert and oriented to person, place, and time.  Psychiatric:        Mood and Affect: Mood normal.   ED Results / Procedures / Treatments   Labs (all labs ordered are listed, but only abnormal results are displayed) Labs Reviewed - No data to display  EKG None  Radiology DG Lumbar Spine Complete  Result Date:  06/03/2021 CLINICAL DATA:  Fall, pain EXAM: LUMBAR SPINE - COMPLETE 4+ VIEW COMPARISON:  None. FINDINGS: There is no evidence of lumbar spine fracture. Alignment is normal. Mild multilevel DA and  disc disease with disc space narrowing and marginal osteophytes. Facet joint arthropathy at L4-L5 and L5-S1. IMPRESSION: No acute fracture or subluxation. Electronically Signed   By: Larose Hires D.O.   On: 06/03/2021 10:24   DG Sacrum/Coccyx  Result Date: 06/03/2021 CLINICAL DATA:  Fall, low back and left knee pain, initial encounter. EXAM: SACRUM AND COCCYX - 2+ VIEW COMPARISON:  None. FINDINGS: No acute osseous abnormality. IMPRESSION: No acute osseous abnormality. Electronically Signed   By: Leanna Battles M.D.   On: 06/03/2021 10:22   DG Knee Complete 4 Views Left  Result Date: 06/03/2021 CLINICAL DATA:  Fall EXAM: LEFT KNEE - COMPLETE 4+ VIEW COMPARISON:  None. FINDINGS: There is no acute fracture or dislocation. Knee alignment is normal. The joint spaces are preserved. The soft tissues are unremarkable. There is no effusion. IMPRESSION: No acute fracture or dislocation. Electronically Signed   By: Lesia Hausen M.D.   On: 06/03/2021 10:22    Procedures Procedures   Medications Ordered in ED Medications  morphine 4 MG/ML injection 4 mg (4 mg Intravenous Given 06/03/21 0925)  LORazepam (ATIVAN) injection 1 mg (1 mg Intravenous Given 06/03/21 6546)    ED Course  I have reviewed the triage vital signs and the nursing notes.  Pertinent labs & imaging results that were available during my care of the patient were reviewed by me and considered in my medical decision making (see chart for details).    MDM Rules/Calculators/A&P                         Nothing is broken.  Pt able to ambulate.  Pain is better.  Pt is stable for d/c.  Return if worse.    Final Clinical Impression(s) / ED Diagnoses Final diagnoses:  Fall, initial encounter  Back strain, initial encounter  Contusion of left  knee, initial encounter    Rx / DC Orders ED Discharge Orders          Ordered    HYDROcodone-acetaminophen (NORCO/VICODIN) 5-325 MG tablet  Every 4 hours PRN        06/03/21 1035    diazepam (VALIUM) 5 MG tablet  2 times daily        06/03/21 1035             Jacalyn Lefevre, MD 06/03/21 1040

## 2021-07-09 ENCOUNTER — Other Ambulatory Visit: Payer: Self-pay

## 2021-07-09 ENCOUNTER — Encounter (HOSPITAL_COMMUNITY): Payer: Self-pay

## 2021-07-09 ENCOUNTER — Emergency Department (HOSPITAL_COMMUNITY): Payer: 59

## 2021-07-09 DIAGNOSIS — Y92007 Garden or yard of unspecified non-institutional (private) residence as the place of occurrence of the external cause: Secondary | ICD-10-CM | POA: Insufficient documentation

## 2021-07-09 DIAGNOSIS — M25562 Pain in left knee: Secondary | ICD-10-CM | POA: Diagnosis not present

## 2021-07-09 DIAGNOSIS — I2510 Atherosclerotic heart disease of native coronary artery without angina pectoris: Secondary | ICD-10-CM | POA: Insufficient documentation

## 2021-07-09 DIAGNOSIS — W109XXA Fall (on) (from) unspecified stairs and steps, initial encounter: Secondary | ICD-10-CM | POA: Diagnosis not present

## 2021-07-09 DIAGNOSIS — M1712 Unilateral primary osteoarthritis, left knee: Secondary | ICD-10-CM | POA: Diagnosis not present

## 2021-07-09 DIAGNOSIS — J45909 Unspecified asthma, uncomplicated: Secondary | ICD-10-CM | POA: Insufficient documentation

## 2021-07-09 DIAGNOSIS — Z7982 Long term (current) use of aspirin: Secondary | ICD-10-CM | POA: Insufficient documentation

## 2021-07-09 DIAGNOSIS — S8392XA Sprain of unspecified site of left knee, initial encounter: Secondary | ICD-10-CM | POA: Diagnosis not present

## 2021-07-09 IMAGING — DX DG KNEE COMPLETE 4+V*L*
4 series · 4 of 4 positions shown · non-contrast
Comparison: [DATE]

CLINICAL DATA: Fall 1 month ago with persistent knee pain,
subsequent encounter

EXAM:
LEFT KNEE - COMPLETE 4+ VIEW

[knee ap]
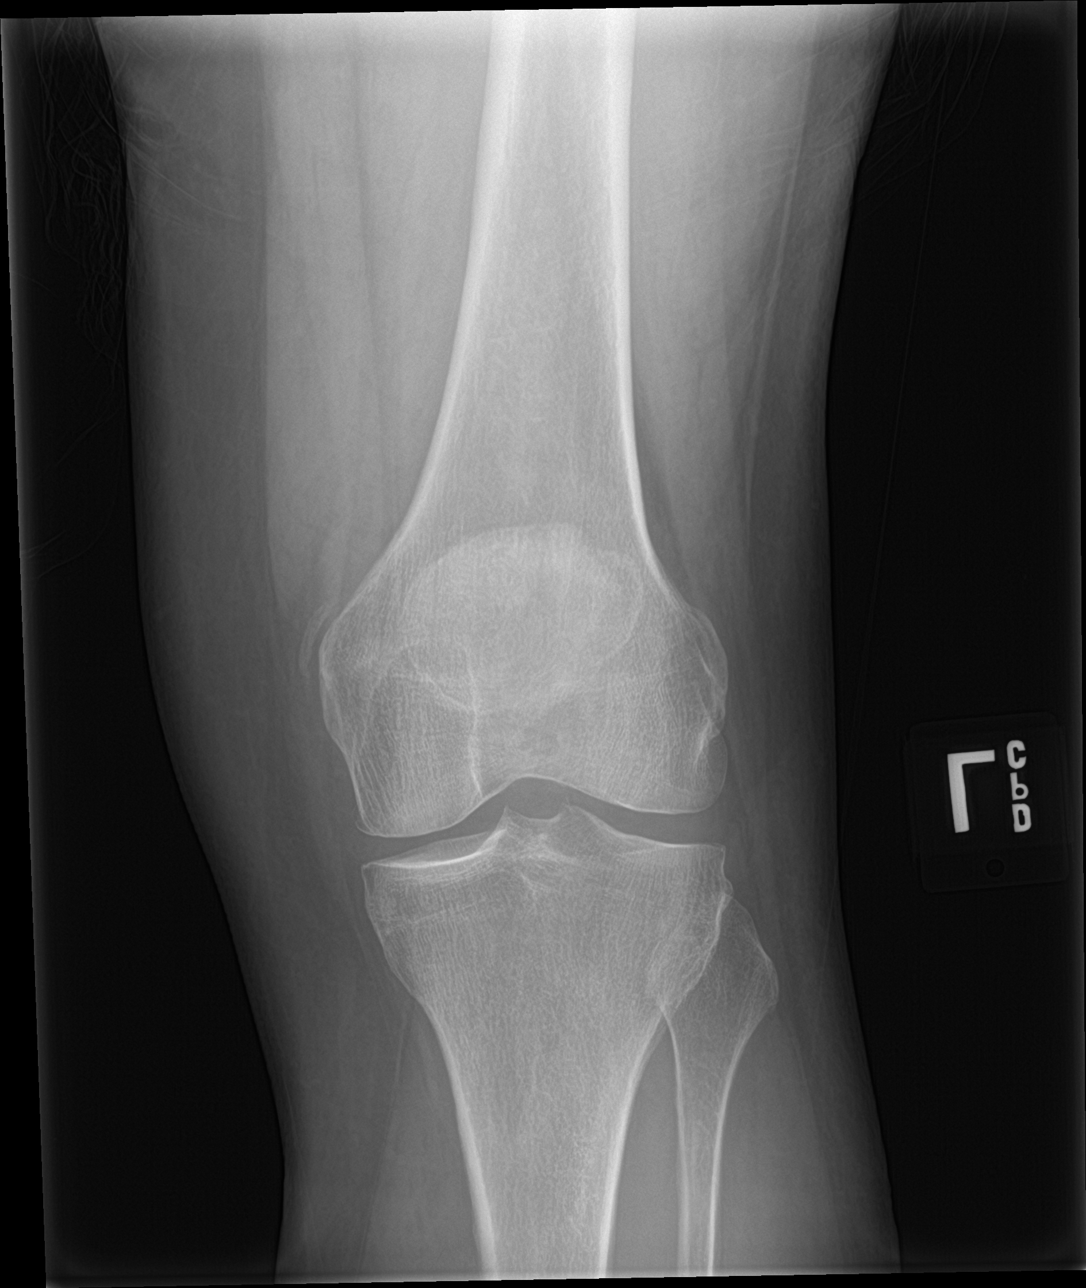

[knee obl (1 of 2)]
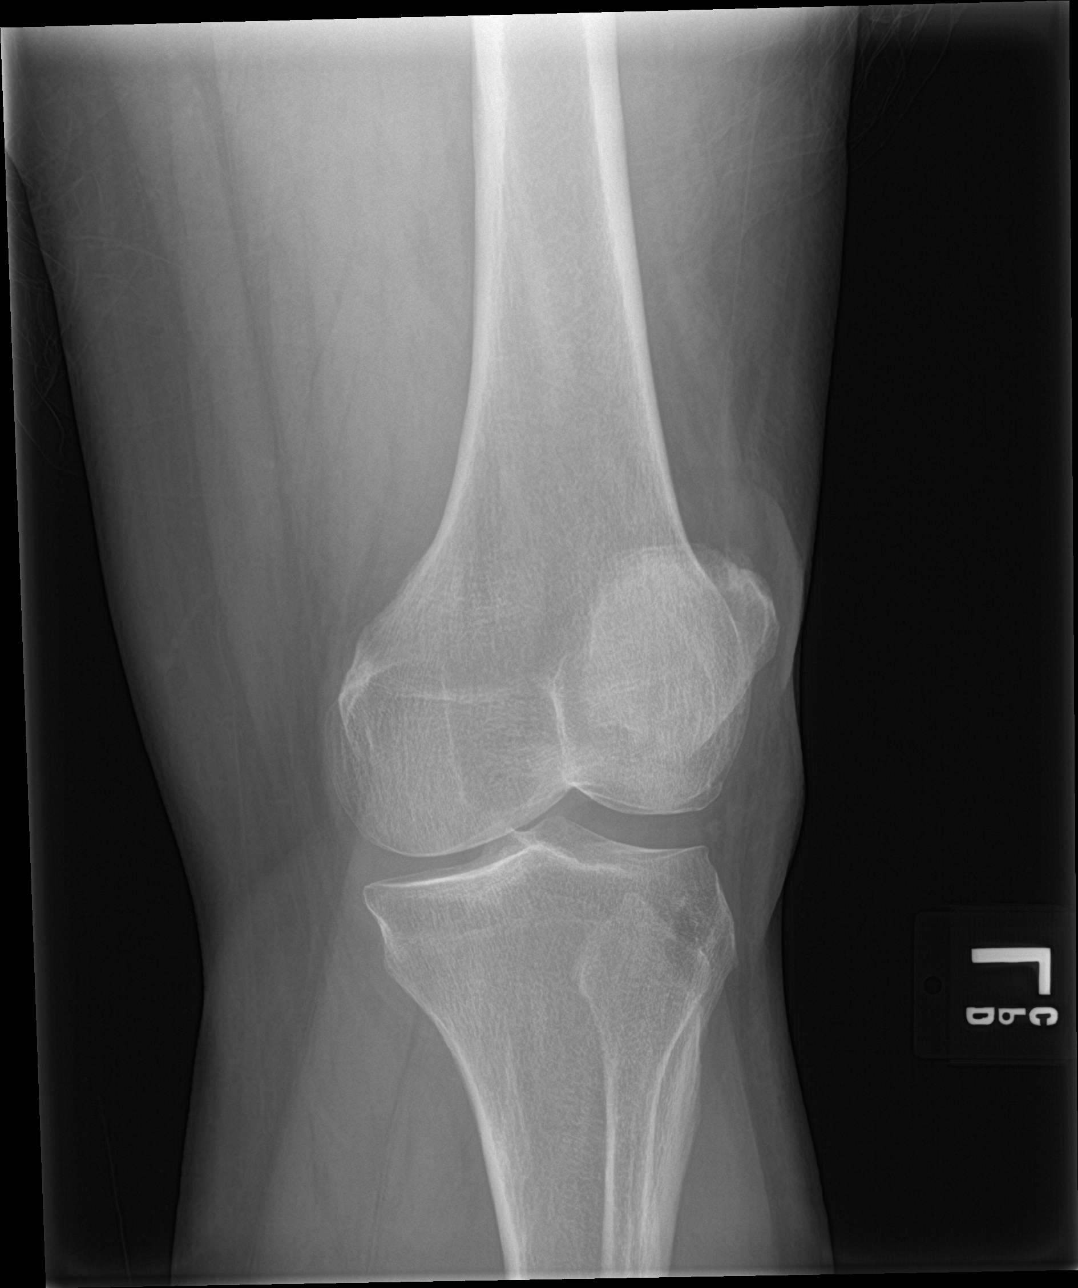

[knee obl (2 of 2)]
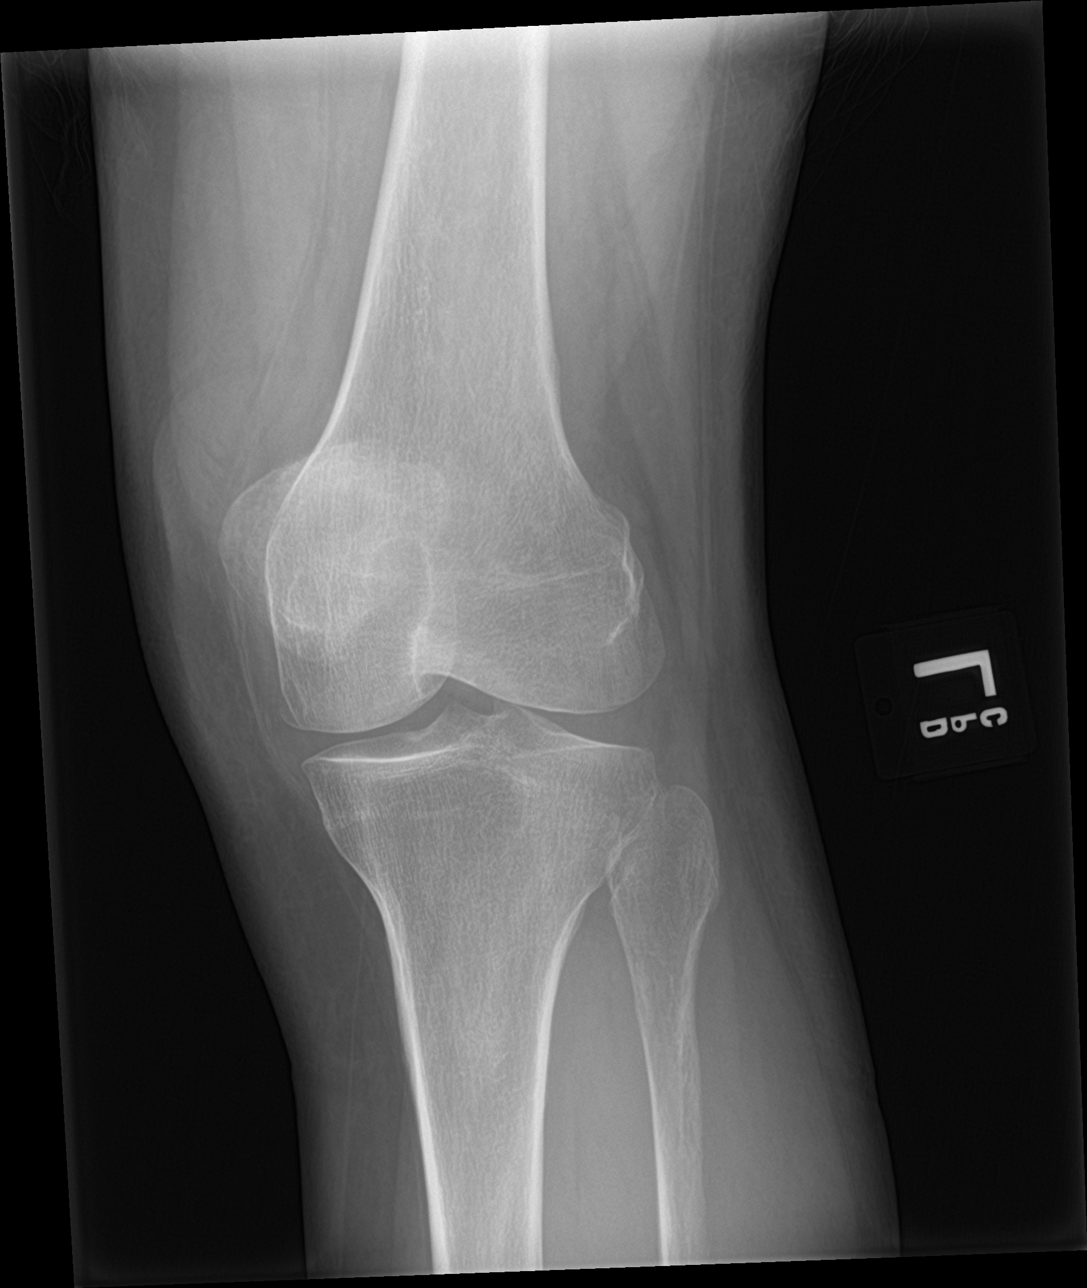

[knee lat]
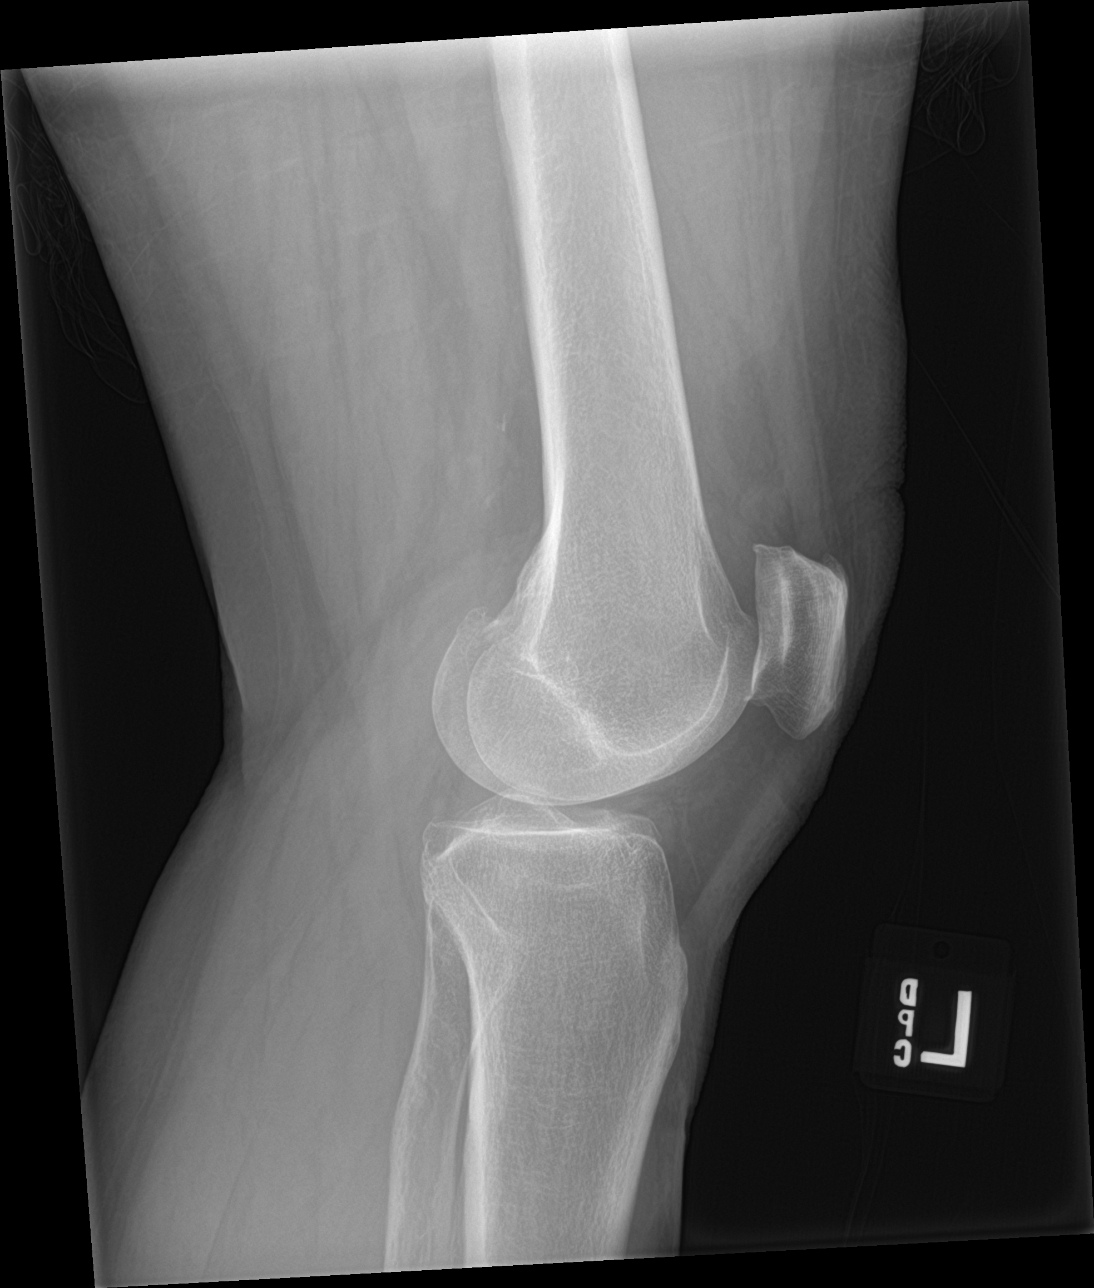

[4 of 4 positions shown; findings below may reference images not displayed]

FINDINGS: Mild degenerative changes are noted in the medial joint space and
patellofemoral space. No joint effusion is seen. No acute fracture
or dislocation is noted.
IMPRESSION: Mild degenerative changes without acute abnormality.

## 2021-07-09 NOTE — ED Triage Notes (Signed)
12/23 fell down stairs and seen here with no obvious injuries to left knee.Today pt was standing in the yard when 2 dogs ran straight into her L knee. Pt now complaining of pain and not able to bear any weight on it.

## 2021-07-10 ENCOUNTER — Emergency Department (HOSPITAL_COMMUNITY)
Admission: EM | Admit: 2021-07-10 | Discharge: 2021-07-10 | Disposition: A | Discharge status: Home / Self Care | Payer: 59 | Attending: Emergency Medicine | Admitting: Emergency Medicine

## 2021-07-10 DIAGNOSIS — S8392XA Sprain of unspecified site of left knee, initial encounter: Secondary | ICD-10-CM

## 2021-07-10 MED ORDER — HYDROCODONE-ACETAMINOPHEN 5-325 MG PO TABS: 1.0000 | ORAL_TABLET | Freq: Four times a day (QID) | ORAL | 0 refills | Status: DC | PRN

## 2021-07-10 MED ORDER — HYDROCODONE-ACETAMINOPHEN 5-325 MG PO TABS
2.0000 | ORAL_TABLET | Freq: Once | ORAL | Status: AC
  Administered 2021-07-10: 2 via ORAL
  Filled 2021-07-10: qty 2

## 2021-07-10 NOTE — ED Provider Notes (Signed)
[facility] EMERGENCY DEPARTMENT Provider Note   CSN: [id number] Arrival date & time: 07/09/21  2230     History  Chief Complaint  Patient presents with   Knee Injury    [name] is a 61 y.o. female.  Patient is a 61 year old female with past medical history of asthma, hyperlipidemia, coronary artery disease with stent.  Patient presenting today with complaints of left knee injury.  Patient was in the yard today when 2 large dogs ran towards her and both ran into the side of her left knee.  She has been experiencing severe pain since.  She is unable to bear weight.  She denies any numbness or tingling.  The history is provided by the patient.      Home Medications Prior to Admission medications   Medication Sig Start Date End Date Taking? Authorizing Provider  acetaminophen (TYLENOL) 500 MG tablet Take 1,000 mg by mouth every 6 (six) hours as needed. For pain    [provider]  albuterol (VENTOLIN HFA) 108 (90 Base) MCG/ACT inhaler Inhale 2 puffs into the lungs every 6 (six) hours as needed for wheezing or shortness of breath. 06/08/20   Elson Areas, PA-C  aspirin 81 MG tablet Take 1 tablet (81 mg total) by mouth daily. 08/17/17   Shirley, [place], DO  atorvastatin (LIPITOR) 80 MG tablet Take 1 tablet (80 mg total) by mouth daily at 6 PM. Patient not taking: Reported on 10/21/2020 08/17/17   Shirley, [place], DO  cephALEXin (KEFLEX) 500 MG capsule Take 2 capsules (1,000 mg total) by mouth 2 (two) times daily. 10/21/20   White, Elita Boone, NP  diazepam (VALIUM) 5 MG tablet Take 1 tablet (5 mg total) by mouth 2 (two) times daily. 06/03/21   Jacalyn Lefevre, MD  HYDROcodone-acetaminophen (NORCO/VICODIN) 5-325 MG tablet Take 1 tablet by mouth every 4 (four) hours as needed. 06/03/21   Jacalyn Lefevre, MD  nitroGLYCERIN (NITROSTAT) 0.4 MG SL tablet Place 0.4 mg under the tongue every 5 (five) minutes as needed. For chest pain    [provider]  nitroGLYCERIN  (NITROSTAT) 0.4 MG SL tablet Place 1 tablet (0.4 mg total) under the tongue every 5 (five) minutes as needed for chest pain. 08/17/17 08/17/18  Marthenia Rolling, DO  ondansetron (ZOFRAN) 4 MG tablet Take 1 tablet (4 mg total) by mouth every 6 (six) hours. 10/24/20   Couture, Cortni S, PA-C      Allergies    Niacin and related and Penicillins    Review of Systems   Review of Systems  All other systems reviewed and are negative.  Physical Exam Updated Vital Signs BP (!) 158/73    Pulse 78    Temp 97.7 F (36.5 C) (Oral)    Resp 19    Ht 5\' 7"  (1.702 m)    Wt 99.8 kg    SpO2 92%    BMI 34.46 kg/m  Physical Exam Vitals and nursing note reviewed.  Constitutional:      General: She is not in acute distress.    Appearance: Normal appearance. She is not ill-appearing.  HENT:     Head: Normocephalic and atraumatic.  Pulmonary:     Effort: Pulmonary effort is normal.  Musculoskeletal:     Comments: The left knee has perhaps a small knee effusion palpable.  There is tenderness to the soft tissues inferior to the patella and lateral knee.  Range of motion is limited due to pain which limits exam significantly.  Distal  pulses and sensation are intact.  Neurological:     Mental Status: She is alert and oriented to person, place, and time.    ED Results / Procedures / Treatments   Labs (all labs ordered are listed, but only abnormal results are displayed) Labs Reviewed - No data to display  EKG None  Radiology DG Knee Complete 4 Views Left  Result Date: 07/09/2021 CLINICAL DATA:  Fall 1 month ago with persistent knee pain, subsequent encounter EXAM: LEFT KNEE - COMPLETE 4+ VIEW COMPARISON:  06/03/2021 FINDINGS: Mild degenerative changes are noted in the medial joint space and patellofemoral space. No joint effusion is seen. No acute fracture or dislocation is noted. IMPRESSION: Mild degenerative changes without acute abnormality. Electronically Signed   By: Alcide Clever M.D.   On: 07/09/2021 23:21     Procedures Procedures    Medications Ordered in ED Medications  HYDROcodone-acetaminophen (NORCO/VICODIN) 5-325 MG per tablet 2 tablet (has no administration in time range)    ED Course/ Medical Decision Making/ A&P  Patient presenting with left knee pain two dogs ran into the side of her knee.  X-rays today are negative for fracture.  Exam limited secondary to pain, but she does have what it seems to be a small knee effusion.  Patient will be placed in a knee immobilizer, advised to use crutches, and is to follow-up with orthopedics if not improving in the next few days.  Final Clinical Impression(s) / ED Diagnoses Final diagnoses:  None    Rx / DC Orders ED Discharge Orders     None         Geoffery Lyons, MD 07/10/21 [phone]

## 2021-07-10 NOTE — Discharge Instructions (Signed)
Take ibuprofen 600 mg every 6 hours as needed for pain.  Begin taking hydrocodone as prescribed as needed for pain not relieved with ibuprofen.  Wear knee immobilizer for comfort and support.   Weightbearing as tolerated using crutches.  Ice for 20 minutes every 2 hours while awake for the next 2 days.  If symptoms are not improving in the next 3 to 4 days, follow-up with Dr. Dallas Schimke in the orthopedic clinic.  His contact information has been provided in this discharge summary for you to call and make these arrangements.

## 2021-07-14 ENCOUNTER — Encounter: Payer: Self-pay | Admitting: Orthopaedic Surgery

## 2021-07-14 ENCOUNTER — Ambulatory Visit (INDEPENDENT_AMBULATORY_CARE_PROVIDER_SITE_OTHER): Payer: 59 | Admitting: Orthopaedic Surgery

## 2021-07-14 ENCOUNTER — Other Ambulatory Visit: Payer: Self-pay

## 2021-07-14 ENCOUNTER — Telehealth: Payer: Self-pay | Admitting: Cardiovascular Disease

## 2021-07-14 VITALS — BP 170/114 | HR 110 | Ht 67.0 in | Wt 220.0 lb

## 2021-07-14 DIAGNOSIS — M25562 Pain in left knee: Secondary | ICD-10-CM | POA: Diagnosis not present

## 2021-07-14 MED ORDER — HYDROCODONE-ACETAMINOPHEN 5-325 MG PO TABS: ORAL_TABLET | ORAL | 0 refills | Status: DC

## 2021-07-14 NOTE — Progress Notes (Signed)
Subjective:    Patient ID: [name], female    DOB: 09-23-1960, 61 y.o.   MRN: [id number]  HPI She fell and had an injury to her back and left knee on 06-03-21.  She was seen in the ER and evaluated.  She had multiple X-rays including the left knee.  The left knee had some swelling and mild pain and popping but slowly got a little better.   She had a slight limp.  It would bother her after standing a lot or walking a lot. She took an occasional Tylenol for pain.  She is not to take NSAIDs secondary to heart problems.  Last Saturday, July 09, 2021 one of her dogs ran into her and she hurt her left knee again in a new fall.  She had more and more pain and went to the ER.  X-rays were done.  She was given a knee immobilizer.  Her knee pain is still present.  It hurts more medially.  She has less swelling but she has been  using ice very often and still uses the knee immobilizer.  She was given pain medicine which helped.  She has tried to bend the knee and it hurts and it hurts in full extension and also medially.   She has no redness, no numbness.  She had no other injury.  I have reviewed both ER notes.  I have reviewed both separate X-rays of the knee on both visits.  I have independently reviewed and interpreted x-rays of this patient done at another site by another physician or qualified health professional.    Review of Systems  Constitutional:  Positive for activity change.  Cardiovascular:  Positive for chest pain and palpitations.  Musculoskeletal:  Positive for arthralgias, back pain, gait problem and joint swelling.  All other systems reviewed and are negative. For Review of Systems, all other systems reviewed and are negative.  The following is a summary of the past history medically, past history surgically, known current medicines, social history and family history.  This information is gathered electronically by the computer from prior information and documentation.   I review this each visit and have found including this information at this point in the chart is beneficial and informative.   Past Medical History:  Diagnosis Date   CAD (coronary artery disease), premature-cutting balloon athrectomy LAD 2004, 3 DES-Promus to Prox LCX 2012   04/02/2012   Coronary artery disease    Hyperlipidemia LDL goal < 70 04/02/2012   Hypertension    Metabolic syndrome 04/03/2012   Morbid obesity with BMI of 40.0-44.9, adult (HCC) 04/02/2012   Myocardial infarct (HCC)    Pneumonia    hx of in 03/2010   Shortness of breath    Unstable angina (HCC) 04/02/2012    Past Surgical History:  Procedure Laterality Date   ABDOMINAL HYSTERECTOMY     ANKLE SURGERY     CORONARY ANGIOPLASTY     LEFT HEART CATHETERIZATION WITH CORONARY ANGIOGRAM N/A 04/03/2012   Procedure: LEFT HEART CATHETERIZATION WITH CORONARY ANGIOGRAM;  Surgeon: Lennette Biharihomas A Kelly, MD;  Location: [facility] CATH LAB;  Service: Cardiovascular;  Laterality: N/A;    Current Outpatient Medications on File Prior to Visit  Medication Sig Dispense Refill   acetaminophen (TYLENOL) 500 MG tablet Take 1,000 mg by mouth every 6 (six) hours as needed. For pain     albuterol (VENTOLIN HFA) 108 (90 Base) MCG/ACT inhaler Inhale 2 puffs into the lungs every 6 (six) hours  as needed for wheezing or shortness of breath. 8 g 0   aspirin 81 MG tablet Take 1 tablet (81 mg total) by mouth daily. 30 tablet 0   atorvastatin (LIPITOR) 80 MG tablet Take 1 tablet (80 mg total) by mouth daily at 6 PM. (Patient not taking: Reported on 10/21/2020) 30 tablet 0   cephALEXin (KEFLEX) 500 MG capsule Take 2 capsules (1,000 mg total) by mouth 2 (two) times daily. 20 capsule 0   diazepam (VALIUM) 5 MG tablet Take 1 tablet (5 mg total) by mouth 2 (two) times daily. 10 tablet 0   nitroGLYCERIN (NITROSTAT) 0.4 MG SL tablet Place 0.4 mg under the tongue every 5 (five) minutes as needed. For chest pain     nitroGLYCERIN (NITROSTAT) 0.4 MG SL tablet Place 1  tablet (0.4 mg total) under the tongue every 5 (five) minutes as needed for chest pain. 100 tablet 3   ondansetron (ZOFRAN) 4 MG tablet Take 1 tablet (4 mg total) by mouth every 6 (six) hours. 12 tablet 0   No current facility-administered medications on file prior to visit.    Social History   Socioeconomic History   Marital status: Divorced    Spouse name: Not on file   Number of children: Not on file   Years of education: Not on file   Highest education level: Not on file  Occupational History   Not on file  Tobacco Use   Smoking status: Former    Types: Cigarettes    Quit date: 06/12/1990    Years since quitting: 31.1   Smokeless tobacco: Never  Vaping Use   Vaping Use: Never used  Substance and Sexual Activity   Alcohol use: No   Drug use: No   Sexual activity: Not on file  Other Topics Concern   Not on file  Social History Narrative   Not on file   Social Determinants of Health   Financial Resource Strain: Not on file  Food Insecurity: Not on file  Transportation Needs: Not on file  Physical Activity: Not on file  Stress: Not on file  Social Connections: Not on file  Intimate Partner Violence: Not on file    Family History  Problem Relation Age of Onset   Coronary artery disease Mother    Cancer - Ovarian Mother    Coronary artery disease Father    Heart attack Brother     BP (!) 170/114    Pulse (!) 110    Ht 5\' 7"  (1.702 m)    Wt 220 lb (99.8 kg)    BMI 34.46 kg/m   Body mass index is 34.46 kg/m.     Objective:   Physical Exam Vitals and nursing note reviewed. Exam conducted with a chaperone present.  Constitutional:      Appearance: She is well-developed.  HENT:     Head: Normocephalic and atraumatic.  Eyes:     Conjunctiva/sclera: Conjunctivae normal.     Pupils: Pupils are equal, round, and reactive to light.  Cardiovascular:     Rate and Rhythm: Normal rate and regular rhythm.  Pulmonary:     Effort: Pulmonary effort is normal.   Abdominal:     Palpations: Abdomen is soft.  Musculoskeletal:     Cervical back: Normal range of motion and neck supple.       Legs:  Skin:    General: Skin is warm and dry.  Neurological:     Mental Status: She is alert and oriented to person,  place, and time.     Cranial Nerves: No cranial nerve deficit.     Motor: No abnormal muscle tone.     Coordination: Coordination normal.     Deep Tendon Reflexes: Reflexes are normal and symmetric. Reflexes normal.  Psychiatric:        Behavior: Behavior normal.        Thought Content: Thought content normal.        Judgment: Judgment normal.          Assessment & Plan:   Encounter Diagnosis  Name Primary?   Acute pain of left knee Yes   I am concerned about a medial meniscus tear on the left plus MCL strain.  She had injury about six weeks earlier and now a new injury to the same knee.  I would like to get a MRI.  Continue the knee immobilizer.  I have reviewed the [place] Controlled Substance Reporting System web site prior to prescribing narcotic medicine for this patient.  Tylenol for mild pain, Vicodin for stronger pain.  Return in two weeks.  Call if any problem.  Precautions discussed.  Electronically Signed Darreld Mclean, MD 2/2/202310:28 AM

## 2021-07-14 NOTE — Patient Instructions (Signed)
Knee Exercises Ask your health care provider which exercises are safe for you. Do exercises exactly as told by your health care provider and adjust them as directed. It is normal to feel mild stretching, pulling, tightness, or discomfort as you do these exercises. Stop right away if you feel sudden pain or your pain gets worse. Do not begin these exercises until told by your health care provider. Stretching and range-of-motion exercises These exercises warm up your muscles and joints and improve the movement and flexibility of your knee. These exercises also help to relieve pain and swelling. Knee extension, prone  Lie on your abdomen (prone position) on a bed. Place your left / right knee just beyond the edge of the surface so your knee is not on the bed. You can put a towel under your left / right thigh just above your kneecap for comfort. Relax your leg muscles and allow gravity to straighten your knee (extension). You should feel a stretch behind your left / right knee. Hold this position for __________ seconds. Scoot up so your knee is supported between repetitions. Repeat __________ times. Complete this exercise __________ times a day. Knee flexion, active  Lie on your back with both legs straight. If this causes back discomfort, bend your left / right knee so your foot is flat on the floor. Slowly slide your left / right heel back toward your buttocks. Stop when you feel a gentle stretch in the front of your knee or thigh (flexion). Hold this position for __________ seconds. Slowly slide your left / right heel back to the starting position. Repeat __________ times. Complete this exercise __________ times a day. Quadriceps stretch, prone  Lie on your abdomen on a firm surface, such as a bed or padded floor. Bend your left / right knee and hold your ankle. If you cannot reach your ankle or pant leg, loop a belt around your foot and grab the belt instead. Gently pull your heel toward your  buttocks. Your knee should not slide out to the side. You should feel a stretch in the front of your thigh and knee (quadriceps). Hold this position for __________ seconds. Repeat __________ times. Complete this exercise __________ times a day. Hamstring, supine  Lie on your back (supine position). Loop a belt or towel over the ball of your left / right foot. The ball of your foot is on the walking surface, right under your toes. Straighten your left / right knee and slowly pull on the belt to raise your leg until you feel a gentle stretch behind your knee (hamstring). Do not let your knee bend while you do this. Keep your other leg flat on the floor. Hold this position for __________ seconds. Repeat __________ times. Complete this exercise __________ times a day. Strengthening exercises These exercises build strength and endurance in your knee. Endurance is the ability to use your muscles for a long time, even after they get tired. Quadriceps, isometric This exercise strengthens the muscles in front of your thigh (quadriceps) without moving your knee joint (isometric). Lie on your back with your left / right leg extended and your other knee bent. Put a rolled towel or small pillow under your knee if told by your health care provider. Slowly tense the muscles in the front of your left / right thigh. You should see your kneecap slide up toward your hip or see increased dimpling just above the knee. This motion will push the back of the knee toward the floor.   For __________ seconds, hold the muscle as tight as you can without increasing your pain. Relax the muscles slowly and completely. Repeat __________ times. Complete this exercise __________ times a day. Straight leg raises This exercise strengthens the muscles in front of your thigh (quadriceps) and the muscles that move your hips (hip flexors). Lie on your back with your left / right leg extended and your other knee bent. Tense the  muscles in the front of your left / right thigh. You should see your kneecap slide up or see increased dimpling just above the knee. Your thigh may even shake a bit. Keep these muscles tight as you raise your leg 4-6 inches (10-15 cm) off the floor. Do not let your knee bend. Hold this position for __________ seconds. Keep these muscles tense as you lower your leg. Relax your muscles slowly and completely after each repetition. Repeat __________ times. Complete this exercise __________ times a day. Hamstring, isometric  Lie on your back on a firm surface. Bend your left / right knee about __________ degrees. Dig your left / right heel into the surface as if you are trying to pull it toward your buttocks. Tighten the muscles in the back of your thighs (hamstring) to "dig" as hard as you can without increasing any pain. Hold this position for __________ seconds. Release the tension gradually and allow your muscles to relax completely for __________ seconds after each repetition. Repeat __________ times. Complete this exercise __________ times a day. Hamstring curls If told by your health care provider, do this exercise while wearing ankle weights. Begin with __________lb / kg weights. Then increase the weight by 1 lb (0.5 kg) increments. Do not wear ankle weights that are more than __________lb / kg. Lie on your abdomen with your legs straight. Bend your left / right knee as far as you can without feeling pain. Keep your hips flat against the floor. Hold this position for __________ seconds. Slowly lower your leg to the starting position. Repeat __________ times. Complete this exercise __________ times a day. Squats This exercise strengthens the muscles in front of your thigh and knee (quadriceps). Stand in front of a table, with your feet and knees pointing straight ahead. You may rest your hands on the table for balance but not for support. Slowly bend your knees and lower your hips like you  are going to sit in a chair. Keep your weight over your heels, not over your toes. Keep your lower legs upright so they are parallel with the table legs. Do not let your hips go lower than your knees. Do not bend lower than told by your health care provider. If your knee pain increases, do not bend as low. Hold the squat position for __________ seconds. Slowly push with your legs to return to standing. Do not use your hands to pull yourself to standing. Repeat __________ times. Complete this exercise __________ times a day. Wall slides This exercise strengthens the muscles in front of your thigh and knee (quadriceps). Lean your back against a smooth wall or door, and walk your feet out 18-24 inches (46-61 cm) from it. Place your feet hip-width apart. Slowly slide down the wall or door until your knees bend __________ degrees. Keep your knees over your heels, not over your toes. Keep your knees in line with your hips. Hold this position for __________ seconds. Repeat __________ times. Complete this exercise __________ times a day. Straight leg raises, side-lying This exercise strengthens the muscles that rotate   the leg at the hip and move it away from your body (hip abductors). Lie on your side with your left / right leg in the top position. Lie so your head, shoulder, knee, and hip line up. You may bend your bottom knee to help you keep your balance. Roll your hips slightly forward so your hips are stacked directly over each other and your left / right knee is facing forward. Leading with your heel, lift your top leg 4-6 inches (10-15 cm). You should feel the muscles in your outer hip lifting. Do not let your foot drift forward. Do not let your knee roll toward the ceiling. Hold this position for __________ seconds. Slowly return your leg to the starting position. Let your muscles relax completely after each repetition. Repeat __________ times. Complete this exercise __________ times a  day. Straight leg raises, prone This exercise stretches the muscles that move your hips away from the front of the pelvis (hip extensors). Lie on your abdomen on a firm surface. You can put a pillow under your hips if that is more comfortable. Tense the muscles in your buttocks and lift your left / right leg about 4-6 inches (10-15 cm). Keep your knee straight as you lift your leg. Hold this position for __________ seconds. Slowly lower your leg to the starting position. Let your leg relax completely after each repetition. Repeat __________ times. Complete this exercise __________ times a day. This information is not intended to replace advice given to you by your health care provider. Make sure you discuss any questions you have with your health care provider. Document Revised: 02/08/2021 Document Reviewed: 02/08/2021 Elsevier Patient Education  2022 [employer]  

## 2021-07-14 NOTE — Telephone Encounter (Signed)
Pt is getting ready to have an MRI done but may have two stents and needs to make sure that this is compatible with an MRI to avoid any issues.. pt hasnt been seen by Dr. Claiborne Billings since 2013... please advise

## 2021-07-14 NOTE — Telephone Encounter (Signed)
Spoke with patient .    Per DOD  Dr Audie Box  it okay to have  MRI , stents that were done in 2012.   Patient has been notified. Patient has not seen a cardiologist since  2012.

## 2021-07-15 ENCOUNTER — Telehealth: Payer: Self-pay

## 2021-07-15 NOTE — Telephone Encounter (Signed)
Pt called stating she is cleared to get an MRI, note in system from cardiology to reflect call information.

## 2021-07-18 NOTE — Addendum Note (Signed)
Addended by: Derek Mound A on: 07/18/2021 12:21 PM   Modules accepted: Orders

## 2021-07-26 ENCOUNTER — Other Ambulatory Visit: Payer: Self-pay | Admitting: Orthopaedic Surgery

## 2021-07-26 ENCOUNTER — Ambulatory Visit: Payer: 59

## 2021-07-26 ENCOUNTER — Ambulatory Visit: Payer: 59 | Admitting: Orthopaedic Surgery

## 2021-07-26 ENCOUNTER — Encounter: Payer: Self-pay | Admitting: Orthopaedic Surgery

## 2021-07-26 ENCOUNTER — Other Ambulatory Visit: Payer: Self-pay

## 2021-07-26 DIAGNOSIS — M25561 Pain in right knee: Secondary | ICD-10-CM | POA: Diagnosis not present

## 2021-07-26 DIAGNOSIS — W010XXA Fall on same level from slipping, tripping and stumbling without subsequent striking against object, initial encounter: Secondary | ICD-10-CM

## 2021-07-26 DIAGNOSIS — M25562 Pain in left knee: Secondary | ICD-10-CM | POA: Diagnosis not present

## 2021-07-26 MED ORDER — HYDROCODONE-ACETAMINOPHEN 7.5-325 MG PO TABS: ORAL_TABLET | ORAL | 0 refills | Status: DC

## 2021-07-26 NOTE — Progress Notes (Signed)
I fell and hurt the right knee bad.  She got twisted up with the knee immobilizer and crutches and fell two days ago in the bathroom and twisted the right knee.  The left knee has been the one hurting before.  She has MRI scheduled for 07-28-21 on the left knee.  The right knee has not improved. It has swelling and medial pain.  She has great difficulty standing because both knees hurt.   She has no other injury to back or sides.  Right knee has ROM 0 to 100 with pain, more medially, slight effusion and slight crepitus.  Positive medial Mc[place] and pain to move.  No distal edema.  NV intact.  Left knee is tender, ROM 0 to 105, medial pain, effusion, crepitus, positive medial Mc[place]ray, NV intact, no distal edema.  X-rays were done of the right knee, reported separately.  Encounter Diagnoses  Name Primary?   Acute pain of right knee Yes   Acute pain of left knee    I will give lockable brace for the left knee so she can lock it and pivot to stand.  Go to the MRI on Thursday.  I will give knee sleeve brace for the right.  I will try to get permission for MRI of the right knee.  I will increase pain medicine, precautions discussed.  I have reviewed the [place] Controlled Substance Reporting System web site prior to prescribing narcotic medicine for this patient.  Return on 08-04-21.  Use walker and wheelchair at home.  Use ice to knees as needed.  Call if any problem.  Precautions discussed.  Electronically Signed Darreld Mclean, MD 2/14/202310:55 AM

## 2021-07-28 ENCOUNTER — Ambulatory Visit (HOSPITAL_COMMUNITY)
Admission: RE | Admit: 2021-07-28 | Discharge: 2021-07-28 | Disposition: A | Discharge status: Home / Self Care | Payer: 59 | Source: Ambulatory Visit | Attending: Orthopaedic Surgery | Admitting: Orthopaedic Surgery

## 2021-07-28 ENCOUNTER — Other Ambulatory Visit: Payer: Self-pay

## 2021-07-28 ENCOUNTER — Ambulatory Visit: Payer: 59 | Admitting: Orthopaedic Surgery

## 2021-07-28 DIAGNOSIS — M25562 Pain in left knee: Secondary | ICD-10-CM | POA: Insufficient documentation

## 2021-07-28 IMAGING — MR MR KNEE*L* W/O CM
7 series · 40 of 40 positions shown · non-contrast
Comparison: X-ray knee [DATE].

CLINICAL DATA: Meniscal injury, knee. Left knee pain related to
being knocked in by dogs on [DATE].

EXAM:
MRI OF THE LEFT KNEE WITHOUT CONTRAST
TECHNIQUE: Multiplanar, multisequence MR imaging of the knee was performed. No
intravenous contrast was administered.

[Series 8: T2 fat-sat · axial · left · 4.0mm · 0.50mm/px · z∈[-63,+61]mm · 4 of 26 slices shown (1 of 3)]
[im 1/26]
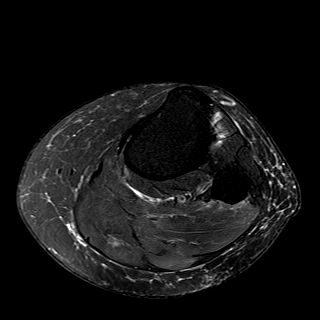
[im 9/26]
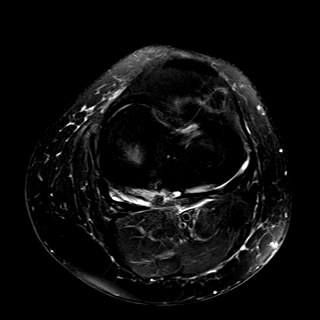
[im 17/26]
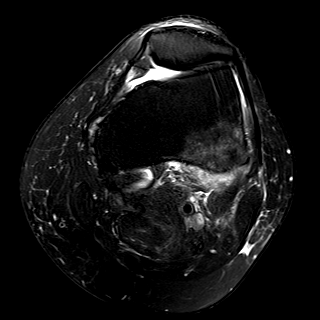
[im 26/26]
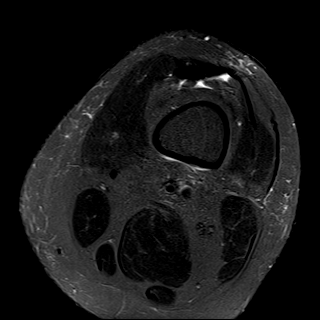

[Series 9: T1 · coronal · left · 4.0mm · 0.59mm/px · 6 of 30 slices shown]
[im 1/30]
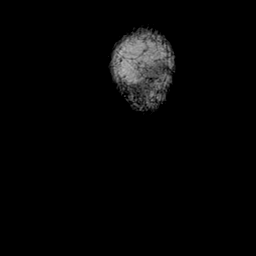
[im 6/30]
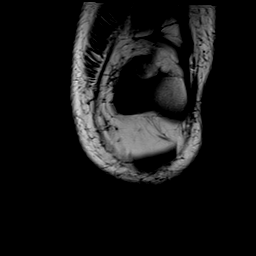
[im 12/30]
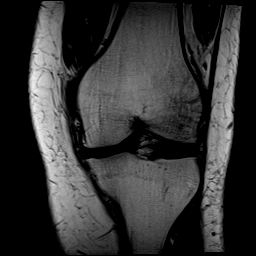
[im 18/30]
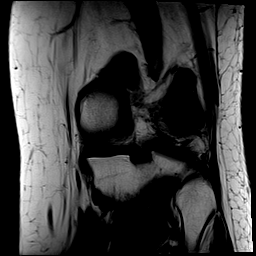
[im 24/30]
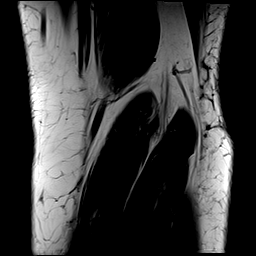
[im 30/30]
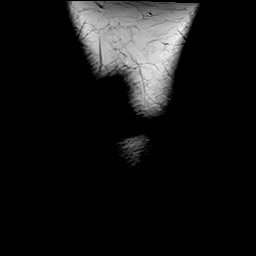

[Series 10: T2 fat-sat · coronal · left · 4.0mm · 0.59mm/px · 6 of 30 slices shown (2 of 3)]
[im 1/30]
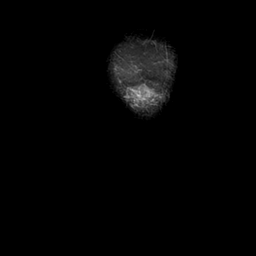
[im 6/30]
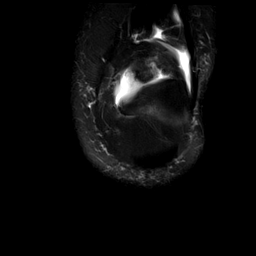
[im 12/30]
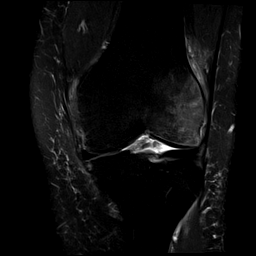
[im 18/30]
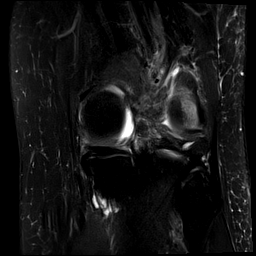
[im 24/30]
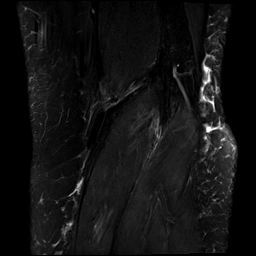
[im 30/30]
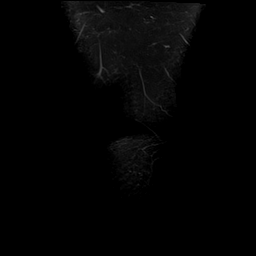

[Series 11: PD fat-sat · coronal · left · 3.0mm · 0.59mm/px · 8 of 40 slices shown (1 of 2)]
[im 1/40]
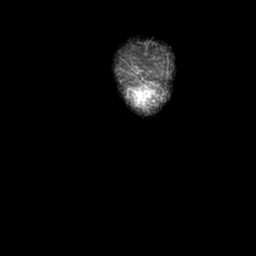
[im 6/40]
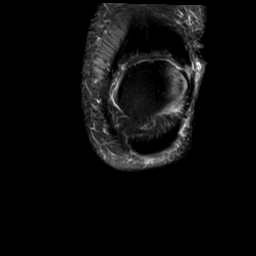
[im 12/40]
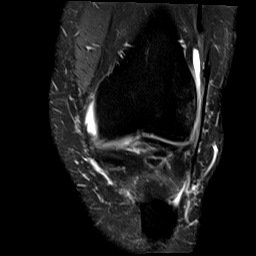
[im 17/40]
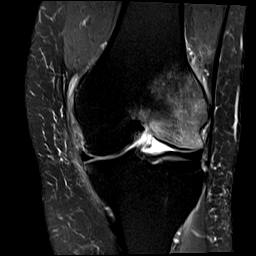
[im 23/40]
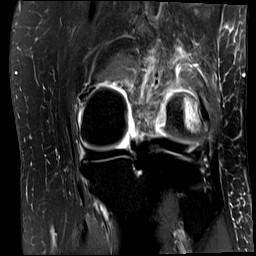
[im 28/40]
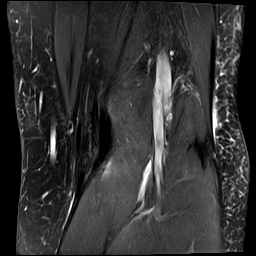
[im 34/40]
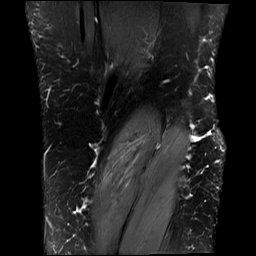
[im 40/40]
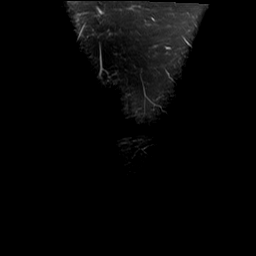

[Series 12: PD fat-sat · sagittal · left · 3.0mm · 0.59mm/px · 6 of 28 slices shown (2 of 2)]
[im 1/28]
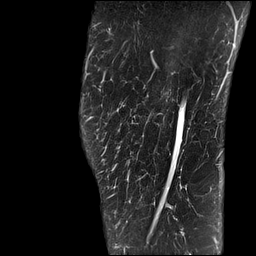
[im 6/28]
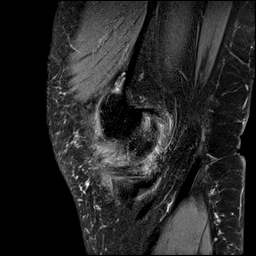
[im 11/28]
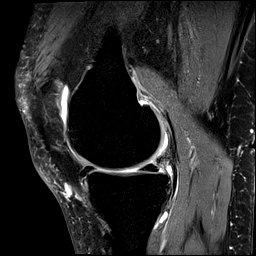
[im 17/28]
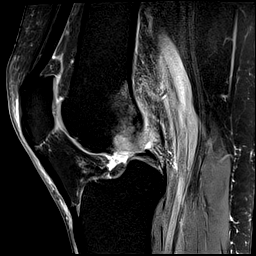
[im 22/28]
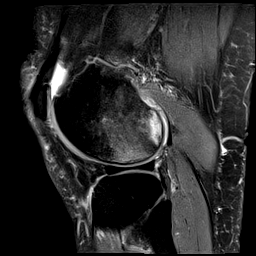
[im 28/28]
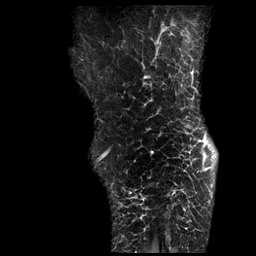

[Series 13: T2 fat-sat · sagittal · left · 3.0mm · 0.59mm/px · 6 of 28 slices shown (3 of 3)]
[im 1/28]
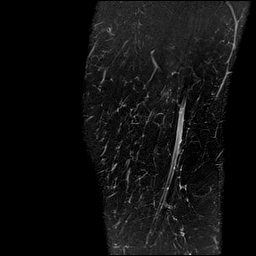
[im 6/28]
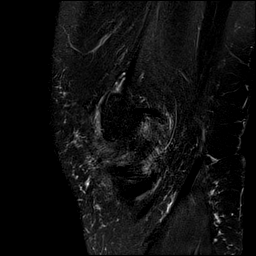
[im 11/28]
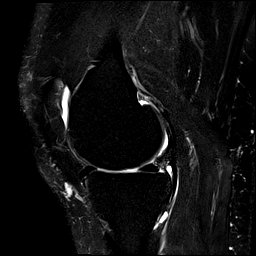
[im 17/28]
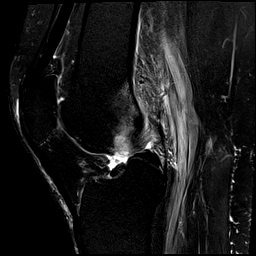
[im 22/28]
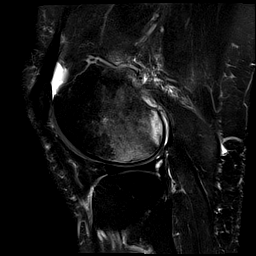
[im 28/28]
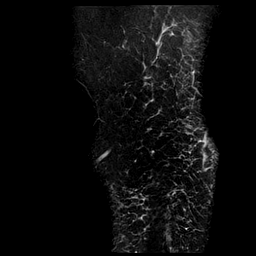

[Series 14: PD · coronal · left · 2.0mm · 0.47mm/px · 4 of 20 slices shown]
[im 1/20]
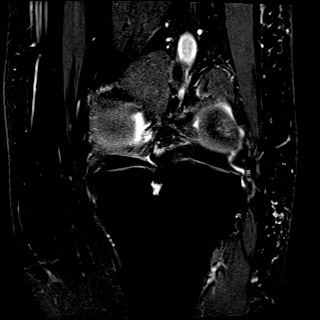
[im 7/20]
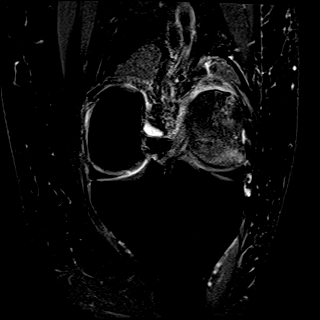
[im 13/20]
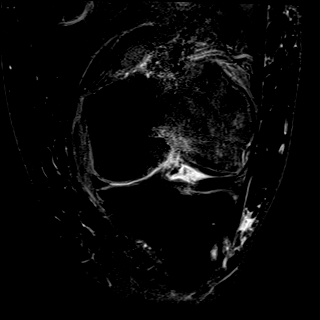
[im 20/20]
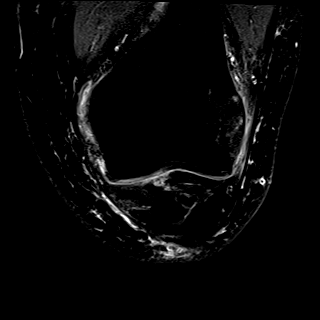

[40 of 40 positions shown; findings below may reference images not displayed]

FINDINGS: MENISCI

Medial: Intact.

Lateral: Intact.

LIGAMENTS

Cruciates: ACL and PCL are intact.

Collaterals: Thickening and increased signal of the proximal MCL
consistent with MCL strain with a partial thickness tear. Lateral
collateral ligament complex is intact.

CARTILAGE

Patellofemoral: Partial-thickness cartilage loss of the lateral
trochlea. Partial-thickness cartilage loss of the lateral patellar
facet.

Medial: Partial-thickness cartilage loss of the medial femorotibial
compartment.

Lateral: Small focal full-thickness chondral defect measuring 3 mm
involving the weight-bearing surface of the lateral femoral condyle.

JOINT: Small joint effusion. Normal ENGR PRINCE. No plical
thickening.

POPLITEAL FOSSA: Popliteus tendon is intact. No Baker's cyst.

EXTENSOR MECHANISM: Intact quadriceps tendon. Intact patellar
tendon. Intact lateral patellar retinaculum. Intact medial patellar
retinaculum. Intact MPFL.

BONES: No aggressive osseous lesion. Subchondral linear low signal
area involving the weight-bearing surface of the lateral femoral
condyle with severe surrounding bone marrow edema consistent with
nondisplaced, nondepressed subchondral insufficiency fracture.

Other: No fluid collection or hematoma. Muscles are normal.
IMPRESSION: 1. Nondisplaced, nondepressed subchondral insufficiency fracture of
the weight-bearing surface of the lateral femoral condyle with
severe surrounding bone marrow edema.
2. MCL strain with a partial thickness tear.
3. Tricompartmental cartilage abnormalities as described above.

## 2021-08-02 ENCOUNTER — Ambulatory Visit: Payer: 59 | Admitting: Orthopaedic Surgery

## 2021-08-02 ENCOUNTER — Other Ambulatory Visit: Payer: Self-pay

## 2021-08-02 ENCOUNTER — Encounter: Payer: Self-pay | Admitting: Orthopaedic Surgery

## 2021-08-02 DIAGNOSIS — M25561 Pain in right knee: Secondary | ICD-10-CM | POA: Diagnosis not present

## 2021-08-02 DIAGNOSIS — S82145A Nondisplaced bicondylar fracture of left tibia, initial encounter for closed fracture: Secondary | ICD-10-CM | POA: Diagnosis not present

## 2021-08-02 NOTE — Progress Notes (Signed)
My knee feels a little better.  The right knee is better too.  She had MRI of the left knee showing: IMPRESSION: 1. Nondisplaced, nondepressed subchondral insufficiency fracture of the weight-bearing surface of the lateral femoral condyle with severe surrounding bone marrow edema. 2. MCL strain with a partial thickness tear. 3. Tricompartmental cartilage abnormalities as described above.  I have explained the findings to her. She was called after we got the results and told to continue staying off it.  She has a modified Don Joy brace that fits well and she likes better than the other brace she had.  Her pain is less.  I have told her to continue the brace for the next two weeks.  We will get X-rays then.  I have explained that the fracture is not displaced, which means no surgery.  I have independently reviewed the MRI.    Her right knee is better, less swelling but still some pain.  She is to have MRI of the right knee on March 3.  Right knee has ROM 0 to 110, medial joint line pain, weakly positive McMurray.  Encounter Diagnoses  Name Primary?   Closed nondisplaced fracture of left tibial plateau, initial encounter Yes   Acute pain of right knee    Get the MRI on March 3.  Continue the brace and only toe touch on the left.  X-rays of the left knee on return in two weeks.  Call if any problem.  Precautions discussed.  Electronically Signed Darreld Mclean, MD 2/21/20238:25 AM

## 2021-08-02 NOTE — Patient Instructions (Addendum)
Right knee, you can begin to come out of the brace as you are able Left knee you do have a non displaced fracture of your tibial plateau try to stay off of your knee so the fracture stays non displaced  See you in 2 weeks for an xray of the left knee  Nondisplaced Tibial Plateau Fracture A tibial plateau fracture is a break in the top of the shin bone (tibia). The top of the tibia has a flat, smooth surface (tibial plateau). This part of the tibia is softer than the rest of the bone. It forms the bottom of the knee joint. If a strong force pushes the thigh bone (femur) down onto the tibial plateau, the tibial plateau can collapse or break apart at the edges. This may also be called an intra-articular fracture. A nondisplaced fracture means that the broken pieces of bone have not moved out of their normal position. This type of fracture can usually be treated without surgery. What are the causes? Common causes of this type of fracture include: Car accidents. Jumps or falls from a significant height. Injuries from activities that put a lot of force on the knee, such as injuries from skiing, mountain biking, or contact sports. What increases the risk? You may be at higher risk for this type of fracture if: You play sports that put a lot of force on your knee, including contact sports. You have a history of bone infections. You are an older person with a condition that causes weak bones (osteoporosis). What are the signs or symptoms? Symptoms of a nondisplaced tibial plateau fracture may include: Pain that gets worse when putting weight on your knee or moving your knee. Knee swelling and bruising. The knee having an abnormal shape (deformity). How is this diagnosed? This condition may be diagnosed based on: Your symptoms and medical history. Your health care provider may ask about recent knee or leg injuries you have had. A physical exam. Imaging tests, such as X-rays, a CT scan, or an  MRI. How is this treated? This condition is treated by wearing a brace on your leg. You will not be able to put any body weight on the leg (weight-bearing restrictions). You may be given crutches, a scooter, a walker, or a wheelchair to help you move around. Treatment may also involve: Prescription pain medicine. Physical therapy. Follow these instructions at home: Medicines Take over-the-counter and prescription medicines only as told by your health care provider. Ask your health care provider if the medicine prescribed to you: Requires you to avoid driving or using machinery. Can cause constipation. You may need to take these actions to prevent or treat constipation: Drink enough fluid to keep your urine pale yellow. Take over-the-counter or prescription medicines. Eat foods that are high in fiber, such as beans, whole grains, and fresh fruits and vegetables. Limit foods that are high in fat and processed sugars, such as fried or sweet foods. If you have a splint or brace: Wear the splint or brace as told by your health care provider. Remove it only as told by your health care provider. Loosen the splint or brace if your toes tingle, become numb, or turn cold and blue. Keep the splint or brace clean and dry. If you have a cast: Do not put pressure on any part of the cast until it is fully hardened. This may take several hours. Do not stick anything inside the cast to scratch your skin. Doing that increases your risk of infection.  Check the skin around the cast every day. Tell your health care provider about any concerns. You may put lotion on dry skin around the edges of the cast. Do not put lotion on the skin underneath the cast. Keep the cast clean and dry. Activity Do not use the injured limb to support your body weight until your health care provider says that you can. Use crutches, a cane, or a walker as told by your health care provider. Return to your normal activities as told by  your health care provider. Ask your health care provider what activities are safe for you. Do exercises as told by your health care provider. Ask your health care provider when it is safe to drive if you have a splint, brace, or a cast on your leg. Managing pain, stiffness, and swelling  If directed, put ice on the injured area. To do this: If you have a removable splint or brace, remove it as told by your health care provider. Put ice in a plastic bag. Place a towel between your skin and the bag or between your splint or cast and the bag. Leave the ice on for 20 minutes, 2-3 times a day. Remove the ice if your skin turns bright red. This is very important. If you cannot feel pain, heat, or cold, you have a greater risk of damage to the area. Move your toes and ankle often to reduce stiffness and swelling. Raise (elevate) the injured area above the level of your heart while you are sitting or lying down. General instructions Do not take baths, swim, or use a hot tub until your health care provider approves. Ask your health care provider if you may take showers. You may only be allowed to take sponge baths. Do not use any products that contain nicotine or tobacco, such as cigarettes, e-cigarettes, and chewing tobacco. These can delay bone healing. If you need help quitting, ask your health care provider. Keep all follow-up visits. This is important. Contact a health care provider if you: Have pain that does not get better with medicine. Get help right away if: You have severe pain or swelling. You have new pain, swelling, or warmth in your lower leg. Your toes or foot: Are unusually cold. Turn a bluish color. Are numb. You have chest pain. You have difficulty breathing. Summary A tibial plateau fracture is a break in the top of the shin bone (tibia), which forms the bottom of the knee joint. A nondisplaced fracture means that the broken pieces of bone have not moved out of their normal  position. Do not use your leg to support your body weight until your health care provider says that you can. Follow weight-bearing restrictions. Keep all follow-up visits. This is important. This information is not intended to replace advice given to you by your health care provider. Make sure you discuss any questions you have with your health care provider. Document Revised: 11/12/2019 Document Reviewed: 11/12/2019 Elsevier Patient Education  2022 [employer].

## 2021-08-04 ENCOUNTER — Ambulatory Visit: Payer: 59 | Admitting: Orthopaedic Surgery

## 2021-08-12 ENCOUNTER — Ambulatory Visit (HOSPITAL_COMMUNITY)
Admission: RE | Admit: 2021-08-12 | Discharge: 2021-08-12 | Disposition: A | Discharge status: Home / Self Care | Payer: 59 | Source: Ambulatory Visit | Attending: Orthopaedic Surgery | Admitting: Orthopaedic Surgery

## 2021-08-12 ENCOUNTER — Other Ambulatory Visit: Payer: Self-pay

## 2021-08-12 DIAGNOSIS — M25561 Pain in right knee: Secondary | ICD-10-CM | POA: Diagnosis not present

## 2021-08-12 IMAGING — MR MR KNEE*R* W/O CM
7 series · 40 of 40 positions shown · non-contrast
Comparison: X-ray knee [DATE].

CLINICAL DATA: Acute right knee pain related to an injury
[DATE].

EXAM:
MRI OF THE RIGHT KNEE WITHOUT CONTRAST
TECHNIQUE: Multiplanar, multisequence MR imaging of the knee was performed. No
intravenous contrast was administered.

[Series 8: T2 fat-sat · axial · right · 4.0mm · 0.47mm/px · z∈[-38,+87]mm · 7 of 26 slices shown (1 of 3)]
[im 1/26]
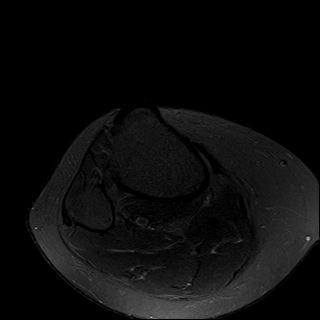
[im 5/26]
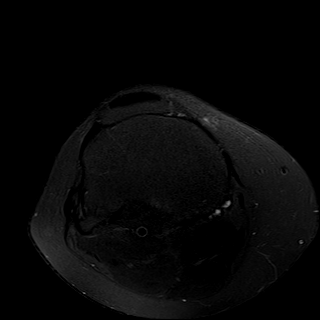
[im 9/26]
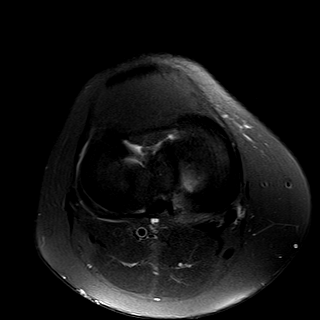
[im 13/26]
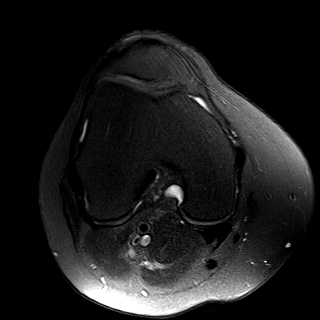
[im 17/26]
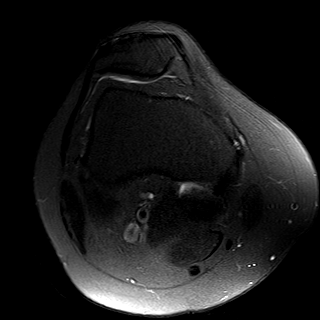
[im 21/26]
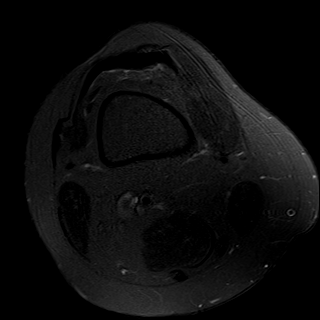
[im 26/26]
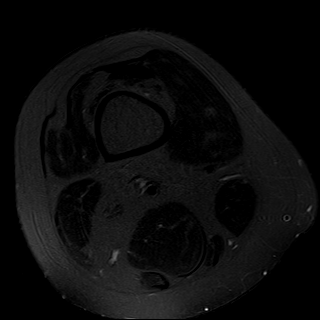

[Series 9: T1 · coronal · right · 4.0mm · 0.59mm/px · 5 of 24 slices shown]
[im 1/24]
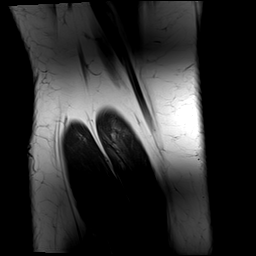
[im 6/24]
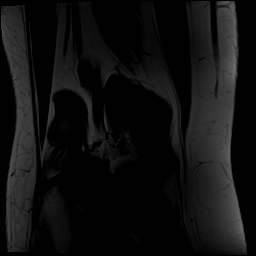
[im 12/24]
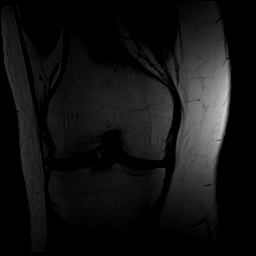
[im 18/24]
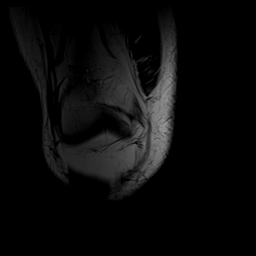
[im 24/24]
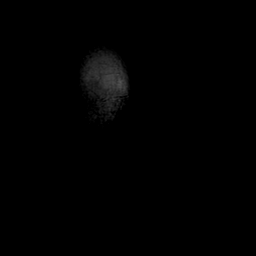

[Series 10: T2 fat-sat · coronal · right · 4.0mm · 0.59mm/px · 6 of 28 slices shown (2 of 3)]
[im 1/28]
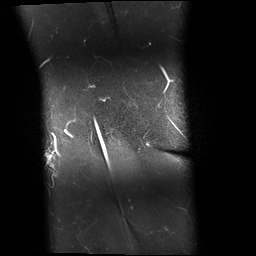
[im 6/28]
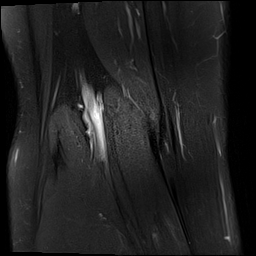
[im 11/28]
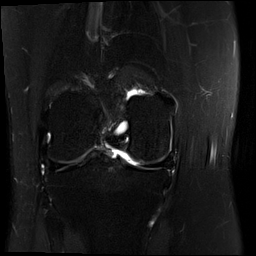
[im 17/28]
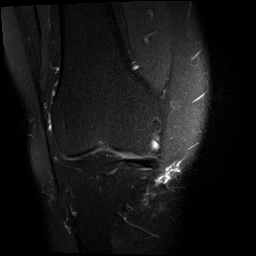
[im 22/28]
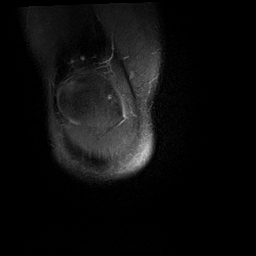
[im 28/28]
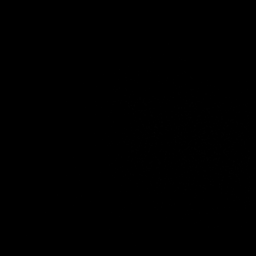

[Series 11: PD fat-sat · coronal · right · 4.0mm · 0.59mm/px · 6 of 28 slices shown (1 of 2)]
[im 1/28]
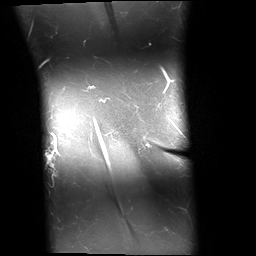
[im 6/28]
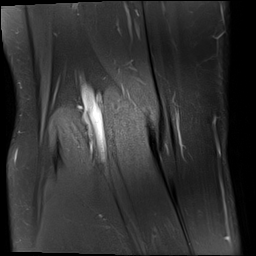
[im 11/28]
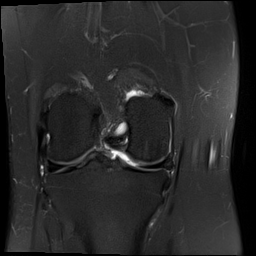
[im 17/28]
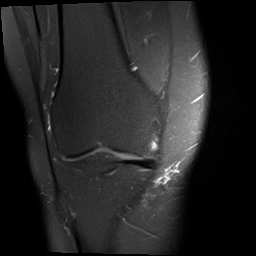
[im 22/28]
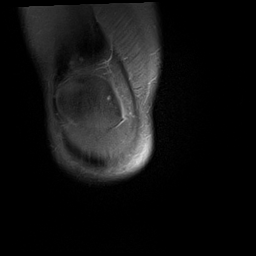
[im 28/28]
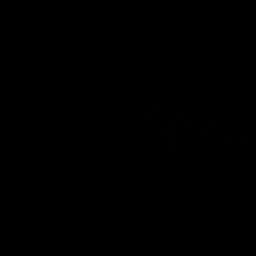

[Series 12: PD fat-sat · sagittal · right · 3.0mm · 0.52mm/px · 8 of 36 slices shown (2 of 2)]
[im 1/36]
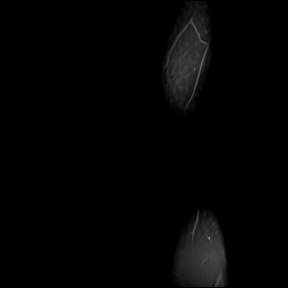
[im 6/36]
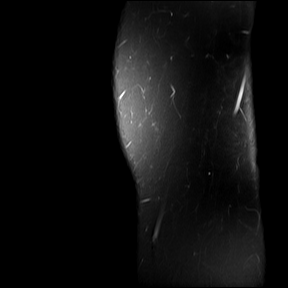
[im 11/36]
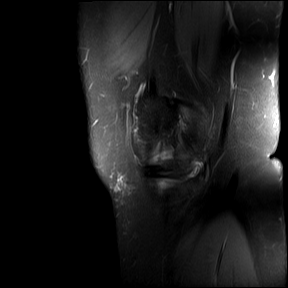
[im 16/36]
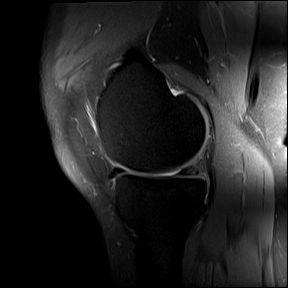
[im 21/36]
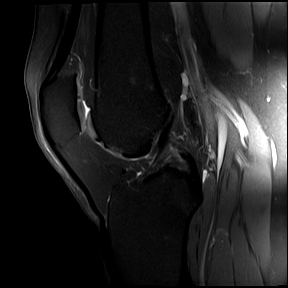
[im 26/36]
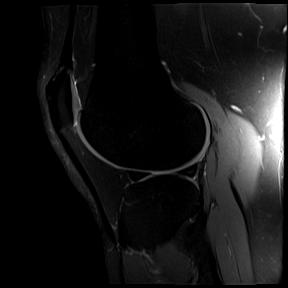
[im 31/36]
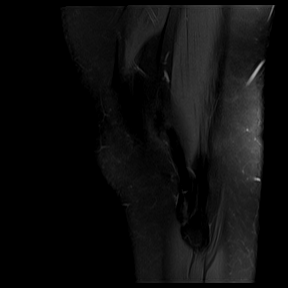
[im 36/36]
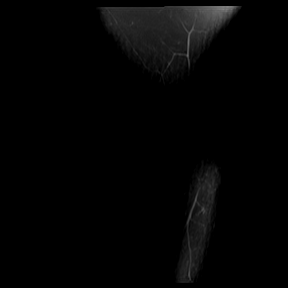

[Series 13: T2 fat-sat · sagittal · right · 3.0mm · 0.59mm/px · 6 of 28 slices shown (3 of 3)]
[im 1/28]
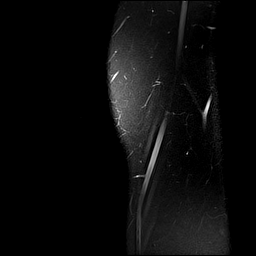
[im 6/28]
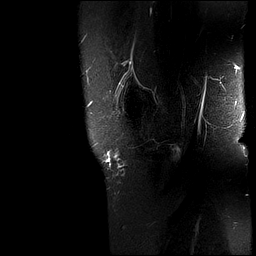
[im 11/28]
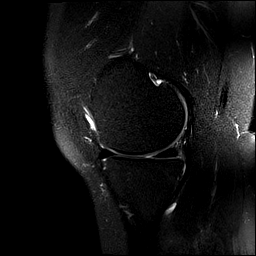
[im 17/28]
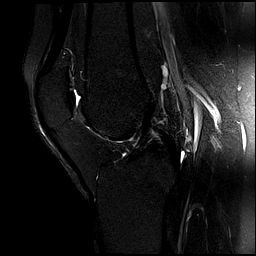
[im 22/28]
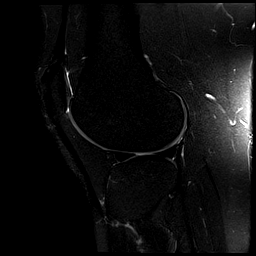
[im 28/28]
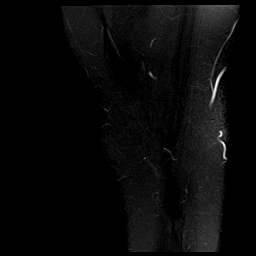

[Series 14: PD · coronal · right · 2.0mm · 0.47mm/px · 2 of 10 slices shown]
[im 1/10]
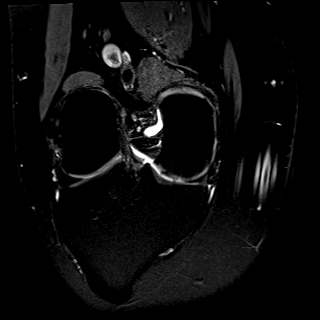
[im 10/10]
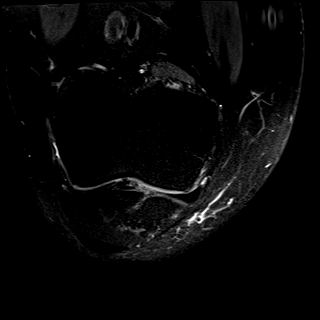

[40 of 40 positions shown; findings below may reference images not displayed]

FINDINGS: MENISCI

Medial meniscus: High-grade radial tear of the medial meniscal
posterior root attachment with extrusion of the meniscal body.

Lateral meniscus:  Intact.

LIGAMENTS

Cruciates: Intact ACL and PCL.

Collaterals: Intact MCL. Lateral collateral ligament complex intact.

CARTILAGE

Patellofemoral: Partial-thickness chondral fissuring of the lateral
patellar facet. No trochlear cartilage defect.

Medial: Mild chondral thinning of the weight-bearing medial
compartment.

Lateral:  No chondral defect.

MISCELLANEOUS

Joint:  No joint effusion. Fat pads within normal limits.

Popliteal Fossa:  No Baker's cyst. Intact popliteus tendon.

Extensor Mechanism:  Intact quadriceps and patellar tendons.

Bones: No acute fracture. No dislocation. No bone marrow edema. No
marrow replacing bone lesion.

Other: Small elongated ganglion cyst extends along the posteromedial
surface of the proximal tibia, closely approximating the
semimembranosus insertion site.
IMPRESSION: 1. High-grade radial tear of the medial meniscal posterior root
attachment with extrusion of the meniscal body.
2. Mild medial and patellofemoral compartment osteoarthritis.
3. Small elongated ganglion cyst extends along the posteromedial
surface of the proximal tibia.

## 2021-08-15 ENCOUNTER — Other Ambulatory Visit: Payer: Self-pay

## 2021-08-15 ENCOUNTER — Ambulatory Visit
Admission: RE | Admit: 2021-08-15 | Discharge: 2021-08-15 | Disposition: A | Discharge status: Home / Self Care | Payer: 59 | Source: Ambulatory Visit | Attending: Family Medicine | Admitting: Family Medicine

## 2021-08-15 VITALS — BP 152/85 | HR 88 | Temp 97.5°F | Resp 18

## 2021-08-15 DIAGNOSIS — H66011 Acute suppurative otitis media with spontaneous rupture of ear drum, right ear: Secondary | ICD-10-CM

## 2021-08-15 DIAGNOSIS — H9191 Unspecified hearing loss, right ear: Secondary | ICD-10-CM | POA: Diagnosis not present

## 2021-08-15 MED ORDER — SULFAMETHOXAZOLE-TRIMETHOPRIM 800-160 MG PO TABS: 1.0000 | ORAL_TABLET | Freq: Two times a day (BID) | ORAL | 0 refills | Status: DC

## 2021-08-15 NOTE — ED Provider Notes (Signed)
?[facility] ? ? ? ?CSN: [id number] ?Arrival date & time: 08/15/21  1151 ? ? ?  ? ?History   ?Chief Complaint ?Chief Complaint  ?Patient presents with  ? Ear Drainage  ?  Pain and ear drainage ?  ? ? ?HPI ?[name] is a 61 y.o. female.  ? ?Presenting today with 3 weeks of right ear infection.  [facility] and diagnosed with an ear infection, given 2 days of prednisone, 5 days of doxycycline and states that she improved somewhat while taking these medications but symptoms returned and progressed after she stopped them.  She is now having "globs of thick green mucus, black chunks" draining from the ear off and on for the past week or so.  She states the pain is significant and she has significantly decreased hearing in the side.  Denies headache, fever, chills, facial or jaw pain.  ? ? ?Past Medical History:  ?Diagnosis Date  ? CAD (coronary artery disease), premature-cutting balloon athrectomy LAD 2004, 3 DES-Promus to Prox LCX 2012   04/02/2012  ? Coronary artery disease   ? Hyperlipidemia LDL goal < 70 04/02/2012  ? Hypertension   ? Metabolic syndrome 04/03/2012  ? Morbid obesity with BMI of 40.0-44.9, adult (HCC) 04/02/2012  ? Myocardial infarct [facility])   ? Pneumonia   ? hx of in 03/2010  ? Shortness of breath   ? Unstable angina (HCC) 04/02/2012  ? ? ?Patient Active Problem List  ? Diagnosis Date Noted  ? Chest pain 08/16/2017  ? History of MI (myocardial infarction)   ? Class 2 severe obesity with serious comorbidity in adult [facility])   ? Metabolic syndrome 04/03/2012  ? HTN (hypertension) 04/03/2012  ? Family history of coronary artery disease 04/03/2012  ? Unstable angina (HCC) 04/02/2012  ? CAD (coronary artery disease), premature;  cutting balloon athrectomy LAD 2004, 3 DES-Promus to Prox LCX 2012   04/02/2012  ? Morbid obesity with BMI of 40.0-44.9, adult (HCC) 04/02/2012  ? Hyperlipidemia 04/02/2012  ? PAINFUL HARDWARE 04/18/2007  ? ? ?Past Surgical History:   ?Procedure Laterality Date  ? ABDOMINAL HYSTERECTOMY    ? ANKLE SURGERY    ? CORONARY ANGIOPLASTY    ? LEFT HEART CATHETERIZATION WITH CORONARY ANGIOGRAM N/A 04/03/2012  ? Procedure: LEFT HEART CATHETERIZATION WITH CORONARY ANGIOGRAM;  Surgeon: Lennette Bihari, MD;  Location: [facility] CATH LAB;  Service: Cardiovascular;  Laterality: N/A;  ? ? ?OB History   ?No obstetric history on file. ?  ? ? ? ?Home Medications   ? ?Prior to Admission medications   ?Medication Sig Start Date End Date Taking? Authorizing Provider  ?sulfamethoxazole-trimethoprim (BACTRIM DS) 800-160 MG tablet Take 1 tablet by mouth 2 (two) times daily. 08/15/21  Yes Particia Nearing, PA-C  ?acetaminophen (TYLENOL) 500 MG tablet Take 1,000 mg by mouth every 6 (six) hours as needed. For pain    [provider]  ?albuterol (VENTOLIN HFA) 108 (90 Base) MCG/ACT inhaler Inhale 2 puffs into the lungs every 6 (six) hours as needed for wheezing or shortness of breath. 06/08/20   Elson Areas, PA-C  ?aspirin 81 MG tablet Take 1 tablet (81 mg total) by mouth daily. 08/17/17   Shirley, [place], DO  ?atorvastatin (LIPITOR) 80 MG tablet Take 1 tablet (80 mg total) by mouth daily at 6 PM. 08/17/17   Shirley, [place], DO  ?cephALEXin (KEFLEX) 500 MG capsule Take 2 capsules (1,000 mg total) by mouth 2 (two)  times daily. 10/21/20   Valinda Hoar, NP  ?diazepam (VALIUM) 5 MG tablet Take 1 tablet (5 mg total) by mouth 2 (two) times daily. 06/03/21   Jacalyn Lefevre, MD  ?HYDROcodone-acetaminophen (NORCO) 7.5-325 MG tablet One tablet every four hours as needed for pain.  Five day supply for acute pain per [facility]. 07/26/21   Darreld Mclean, MD  ?nitroGLYCERIN (NITROSTAT) 0.4 MG SL tablet Place 0.4 mg under the tongue every 5 (five) minutes as needed. For chest pain    [provider]  ?ondansetron (ZOFRAN) 4 MG tablet Take 1 tablet (4 mg total) by mouth every 6 (six) hours. 10/24/20   Couture, Cortni S, PA-C  ? ? ?Family History ?Family History   ?Problem Relation Age of Onset  ? Coronary artery disease Mother   ? Cancer - Ovarian Mother   ? Coronary artery disease Father   ? Heart attack Brother   ? ? ?Social History ?Social History  ? ?Tobacco Use  ? Smoking status: Former  ?  Types: Cigarettes  ?  Quit date: 06/12/1990  ?  Years since quitting: 31.1  ? Smokeless tobacco: Never  ?Vaping Use  ? Vaping Use: Never used  ?Substance Use Topics  ? Alcohol use: No  ? Drug use: No  ? ? ? ?Allergies   ?Niacin and related and Penicillins ? ? ?Review of Systems ?Review of Systems ?Per HPI ? ?Physical Exam ?Triage Vital Signs ?ED Triage Vitals [08/15/21 1229]  ?Enc Vitals Group  ?   BP (!) 152/85  ?   Pulse Rate 88  ?   Resp 18  ?   Temp (!) 97.5 ?F (36.4 ?C)  ?   Temp Source Oral  ?   SpO2 97 %  ?   Weight   ?   Height   ?   Head Circumference   ?   Peak Flow   ?   Pain Score 7  ?   Pain Loc   ?   Pain Edu?   ?   Excl. in GC?   ? ?No data found. ? ?Updated Vital Signs ?BP (!) 152/85 (BP Location: Right Arm)   Pulse 88   Temp (!) 97.5 ?F (36.4 ?C) (Oral)   Resp 18   SpO2 97%  ? ?Visual Acuity ?Right Eye Distance:   ?Left Eye Distance:   ?Bilateral Distance:   ? ?Right Eye Near:   ?Left Eye Near:    ?Bilateral Near:    ? ?Physical Exam ?Vitals and nursing note reviewed.  ?Constitutional:   ?   Appearance: Normal appearance. She is not ill-appearing.  ?HENT:  ?   Head: Atraumatic.  ?   Left Ear: Tympanic membrane normal.  ?   Ears:  ?   Comments: Right EAC with thick yellow drainage, debris and dark chunks mixed in.  Unable to visualize TM but given her pain and concern for perforation of previous provider 2 weeks ago we will forego a lavage at this time. ?Eyes:  ?   Extraocular Movements: Extraocular movements intact.  ?   Conjunctiva/sclera: Conjunctivae normal.  ?Cardiovascular:  ?   Rate and Rhythm: Normal rate and regular rhythm.  ?   Heart sounds: Normal heart sounds.  ?Pulmonary:  ?   Effort: Pulmonary effort is normal.  ?   Breath sounds: Normal breath  sounds.  ?Musculoskeletal:     ?   General: Normal range of motion.  ?   Cervical back: Normal range  of motion and neck supple.  ?Skin: ?   General: Skin is warm and dry.  ?Neurological:  ?   Mental Status: She is alert and oriented to person, place, and time.  ?Psychiatric:     ?   Mood and Affect: Mood normal.     ?   Thought Content: Thought content normal.     ?   Judgment: Judgment normal.  ? ? ? ?UC Treatments / Results  ?Labs ?(all labs ordered are listed, but only abnormal results are displayed) ?Labs Reviewed - No data to display ? ?EKG ? ? ?Radiology ?No results found. ? ?Procedures ?Procedures (including critical care time) ? ?Medications Ordered in UC ?Medications - No data to display ? ?Initial Impression / Assessment and Plan / UC Course  ?I have reviewed the triage vital signs and the nursing notes. ? ?Pertinent labs & imaging results that were available during my care of the patient were reviewed by me and considered in my medical decision making (see chart for details). ? ?  ? ?As stated in physical exam, unclear if perforation at this time given the amount of debris present in the [facility] blocking good visualization of the TM.  Previous provider 3 weeks ago was concern for a perforation and significant infection so we will forego lavage given this concern.  Possibly did not have reloaded of course of antibiotics upon initial treatment so will change to Bactrim and extend to 10-day course.  Discussed supportive care and return precautions.  ENT follow-up recommended. ? ?Final Clinical Impressions(s) / UC Diagnoses  ? ?Final diagnoses:  ?Acute suppurative otitis media of right ear with spontaneous rupture of tympanic membrane, recurrence not specified  ?Decreased hearing of right ear  ? ?Discharge Instructions   ?None ?  ? ?ED Prescriptions   ? ? Medication Sig Dispense Auth. Provider  ? sulfamethoxazole-trimethoprim (BACTRIM DS) 800-160 MG tablet Take 1 tablet by mouth 2 (two) times daily. 20 tablet  Particia Nearing, [place]  ? ?  ? ?PDMP not reviewed this encounter. ?  ?Particia Nearing, PA-C ?08/15/21 1309 ? ?

## 2021-08-15 NOTE — ED Triage Notes (Signed)
Pt states her right ear is hurting since July 26, 2021. She ws diagnosed with a bad ear infection ? ?Pt states that it cleared up but 3 days later she states that globs of green mucus with black stuff came out of her ear ? ?Pt states it is hurting to chew ? ? ?

## 2021-08-16 ENCOUNTER — Ambulatory Visit: Payer: 59

## 2021-08-16 ENCOUNTER — Encounter: Payer: Self-pay | Admitting: Orthopaedic Surgery

## 2021-08-16 ENCOUNTER — Other Ambulatory Visit: Payer: Self-pay | Admitting: Orthopaedic Surgery

## 2021-08-16 ENCOUNTER — Other Ambulatory Visit: Payer: Self-pay

## 2021-08-16 ENCOUNTER — Ambulatory Visit (INDEPENDENT_AMBULATORY_CARE_PROVIDER_SITE_OTHER): Payer: 59 | Admitting: Orthopaedic Surgery

## 2021-08-16 DIAGNOSIS — S83241A Other tear of medial meniscus, current injury, right knee, initial encounter: Secondary | ICD-10-CM | POA: Diagnosis not present

## 2021-08-16 DIAGNOSIS — S82145A Nondisplaced bicondylar fracture of left tibia, initial encounter for closed fracture: Secondary | ICD-10-CM

## 2021-08-16 NOTE — Progress Notes (Signed)
My left knee is better.  My right knee hurts some. ? ?She has been wearing the brace support for the left knee regularly.  She has no new problem there. ? ?The right knee hurts some and she is wearing a knee sleeve.  She uses a wheelchair all the time. ? ?X-rays of the left knee were done today, reported separately. ? ?MRI of the right knee showed: ?IMPRESSION: ?1. High-grade radial tear of the medial meniscal posterior root ?attachment with extrusion of the meniscal body. ?2. Mild medial and patellofemoral compartment osteoarthritis. ?3. Small elongated ganglion cyst extends along the posteromedial ?surface of the proximal tibia. ? ?I have explained the findings of the MRI.  She may need arthroscopy of the right knee once the left knee heals.  I have explained the process to her. ? ?I have independently reviewed the MRI.   ? ?Encounter Diagnoses  ?Name Primary?  ? Closed nondisplaced fracture of left tibial plateau, subsequent encounter Yes  ? Acute tear medial meniscus, right, initial encounter   ? ?Continue the knee brace on the left.  Let the fracture heal first then consider arthroscopy of the right knee. ? ?Wear knee sleeve on the right knee. ? ?Continue wheelchair. ? ?Return in three weeks.  X-rays of the left knee then. ? ?Call if any problem. ? ?Precautions discussed. ? ?Electronically Signed ?Darreld Mclean, MD ?3/7/20238:53 AM ? ?

## 2021-09-06 ENCOUNTER — Other Ambulatory Visit: Payer: Self-pay

## 2021-09-06 ENCOUNTER — Ambulatory Visit (INDEPENDENT_AMBULATORY_CARE_PROVIDER_SITE_OTHER): Payer: 59 | Admitting: Orthopaedic Surgery

## 2021-09-06 ENCOUNTER — Ambulatory Visit: Payer: 59

## 2021-09-06 ENCOUNTER — Encounter: Payer: Self-pay | Admitting: Orthopaedic Surgery

## 2021-09-06 DIAGNOSIS — S82145D Nondisplaced bicondylar fracture of left tibia, subsequent encounter for closed fracture with routine healing: Secondary | ICD-10-CM | POA: Diagnosis not present

## 2021-09-06 DIAGNOSIS — S82145A Nondisplaced bicondylar fracture of left tibia, initial encounter for closed fracture: Secondary | ICD-10-CM

## 2021-09-06 NOTE — Progress Notes (Signed)
I am better. ? ?Her left knee is not hurting. It is two months today since she hurt it.  MRI showed insufficiency fracture of the lateral tibial plateau on the left.  She has been using a long Reed Breech type brace and crutches.  She also has meniscus tear on the right and uses a knee sleeve there. ? ?Left knee has no effusion today, ROM is full, slightly tender medially, weakly positive medial [place], [place] intact. ? ?Encounter Diagnosis  ?Name Primary?  ? Closed nondisplaced fracture of left tibial plateau with routine healing, subsequent encounter Yes  ? ?She can stop the Reed Breech type brace.  Gradually increase activity and weight bearing with one crutch. ? ?See Dr. Aline Brochure for evaluation and consideration of possible arthroscopy of the knees. ? ?I will see as needed. ? ?Call if any problem. ? ?Precautions discussed. ? ?Electronically Signed ?Sanjuana Kava, MD ?3/28/20239:02 AM ? ?

## 2021-09-09 DIAGNOSIS — H6091 Unspecified otitis externa, right ear: Secondary | ICD-10-CM | POA: Diagnosis not present

## 2021-09-21 ENCOUNTER — Encounter: Payer: Self-pay | Admitting: Orthopedic Surgery

## 2021-09-21 ENCOUNTER — Ambulatory Visit: Payer: 59 | Admitting: Orthopedic Surgery

## 2021-09-21 VITALS — BP 146/95 | HR 83 | Ht 67.0 in | Wt 241.0 lb

## 2021-09-21 DIAGNOSIS — M1711 Unilateral primary osteoarthritis, right knee: Secondary | ICD-10-CM

## 2021-09-21 DIAGNOSIS — S83241A Other tear of medial meniscus, current injury, right knee, initial encounter: Secondary | ICD-10-CM | POA: Diagnosis not present

## 2021-09-21 NOTE — Progress Notes (Signed)
Chief Complaint  ?Patient presents with  ? Knee Pain  ?  RT knee ?Consultation ?DOI 07/25/21  ? Consultation  ?  Rt knee  ? ?[name] is here for possible arthroscopy right knee ? ?She suffered from a left knee MCL lateral plateau fracture which was treated with bracing successfully ? ?She comes in with torn medial meniscus of the right knee and some osteoarthritis ? ?See report below ? ?She is not complaining of any pain in the right knee at this time.  She says "I can hop on this knee" ? ?Examination shows that she has no tenderness on the medial joint medial femoral condyle or peripatellar region she has full range of motion no swelling ? ?No limp when walking ? ?We discussed this and looked at her MRI.  Her MRI does show arthritis in the medial compartment patellofemoral compartment and a macerated extruded medial meniscus tear ? ?Since she is asymptomatic recommend no surgery at this time if she becomes symptomatic then arthroscopic surgery would certainly be warranted ? ? ? ? ?

## 2021-09-26 DIAGNOSIS — H6091 Unspecified otitis externa, right ear: Secondary | ICD-10-CM | POA: Diagnosis not present

## 2021-10-11 DIAGNOSIS — H9202 Otalgia, left ear: Secondary | ICD-10-CM | POA: Diagnosis not present

## 2021-11-08 DIAGNOSIS — H6092 Unspecified otitis externa, left ear: Secondary | ICD-10-CM | POA: Diagnosis not present

## 2021-12-06 DIAGNOSIS — H6092 Unspecified otitis externa, left ear: Secondary | ICD-10-CM | POA: Diagnosis not present

## 2022-01-05 DIAGNOSIS — H6092 Unspecified otitis externa, left ear: Secondary | ICD-10-CM | POA: Diagnosis not present

## 2022-02-24 ENCOUNTER — Other Ambulatory Visit: Payer: Self-pay

## 2022-02-24 ENCOUNTER — Encounter (HOSPITAL_COMMUNITY): Payer: Self-pay | Admitting: *Deleted

## 2022-02-24 ENCOUNTER — Emergency Department (HOSPITAL_COMMUNITY): Payer: 59

## 2022-02-24 ENCOUNTER — Emergency Department (HOSPITAL_COMMUNITY)
Admission: EM | Admit: 2022-02-24 | Discharge: 2022-02-24 | Disposition: A | Discharge status: Home / Self Care | Payer: 59 | Attending: Emergency Medicine | Admitting: Emergency Medicine

## 2022-02-24 DIAGNOSIS — I10 Essential (primary) hypertension: Secondary | ICD-10-CM | POA: Insufficient documentation

## 2022-02-24 DIAGNOSIS — R0789 Other chest pain: Secondary | ICD-10-CM

## 2022-02-24 DIAGNOSIS — R0602 Shortness of breath: Secondary | ICD-10-CM | POA: Diagnosis not present

## 2022-02-24 DIAGNOSIS — R079 Chest pain, unspecified: Secondary | ICD-10-CM | POA: Diagnosis not present

## 2022-02-24 DIAGNOSIS — Z7982 Long term (current) use of aspirin: Secondary | ICD-10-CM | POA: Diagnosis not present

## 2022-02-24 DIAGNOSIS — U071 COVID-19: Secondary | ICD-10-CM | POA: Insufficient documentation

## 2022-02-24 DIAGNOSIS — J018 Other acute sinusitis: Secondary | ICD-10-CM | POA: Diagnosis not present

## 2022-02-24 DIAGNOSIS — I2510 Atherosclerotic heart disease of native coronary artery without angina pectoris: Secondary | ICD-10-CM | POA: Diagnosis not present

## 2022-02-24 LAB — BASIC METABOLIC PANEL
Anion gap: 10 (ref 5–15)
BUN: 10 mg/dL (ref 8–23)
CO2: 26 mmol/L (ref 22–32)
Calcium: 9.5 mg/dL (ref 8.9–10.3)
Chloride: 99 mmol/L (ref 98–111)
Creatinine, Ser: 0.76 mg/dL (ref 0.44–1.00)
GFR, Estimated: >60 mL/min (ref >60)
Glucose, Bld: 138 mg/dL — ABNORMAL HIGH (ref 70–99)
Potassium: 4 mmol/L (ref 3.5–5.1)
Sodium: 135 mmol/L (ref 135–145)

## 2022-02-24 LAB — RESP PANEL BY RT-PCR (FLU A&B, COVID) ARPGX2
Influenza A by PCR: NEGATIVE
Influenza B by PCR: NEGATIVE
SARS Coronavirus 2 by RT PCR: POSITIVE — AB

## 2022-02-24 LAB — CBC
HCT: 43.2 % (ref 36.0–46.0)
Hemoglobin: 14.2 g/dL (ref 12.0–15.0)
MCH: 27.6 pg (ref 26.0–34.0)
MCHC: 32.9 g/dL (ref 30.0–36.0)
MCV: 83.9 fL (ref 80.0–100.0)
Platelets: 203 10*3/uL (ref 150–400)
RBC: 5.15 MIL/uL — ABNORMAL HIGH (ref 3.87–5.11)
RDW: 13.3 % (ref 11.5–15.5)
WBC: 10.1 10*3/uL (ref 4.0–10.5)
nRBC: 0 % (ref 0.0–0.2)

## 2022-02-24 LAB — TROPONIN I (HIGH SENSITIVITY)
Troponin I (High Sensitivity): 8 ng/L (ref <18)
Troponin I (High Sensitivity): 7 ng/L (ref <18)

## 2022-02-24 MED ORDER — PREDNISONE 50 MG PO TABS
60.0000 mg | ORAL_TABLET | Freq: Once | ORAL | Status: AC
  Administered 2022-02-24: 60 mg via ORAL
  Filled 2022-02-24: qty 1

## 2022-02-24 MED ORDER — SODIUM CHLORIDE 0.9 % IV BOLUS
1000.0000 mL | Freq: Once | INTRAVENOUS | Status: AC
  Administered 2022-02-24: 1000 mL via INTRAVENOUS

## 2022-02-24 MED ORDER — ONDANSETRON HCL 4 MG/2ML IJ SOLN
4.0000 mg | Freq: Once | INTRAMUSCULAR | Status: AC
  Administered 2022-02-24: 4 mg via INTRAVENOUS
  Filled 2022-02-24: qty 2

## 2022-02-24 MED ORDER — IBUPROFEN 800 MG PO TABS
800.0000 mg | ORAL_TABLET | Freq: Once | ORAL | Status: AC
Start: 2022-02-24 — End: 2022-02-24
  Administered 2022-02-24: 800 mg via ORAL
  Filled 2022-02-24: qty 1

## 2022-02-24 MED ORDER — ONDANSETRON 4 MG PO TBDP
4.0000 mg | ORAL_TABLET | Freq: Once | ORAL | Status: DC
  Filled 2022-02-24: qty 1

## 2022-02-24 MED ORDER — MOLNUPIRAVIR EUA 200MG CAPSULE: 4.0000 | ORAL_CAPSULE | Freq: Two times a day (BID) | ORAL | 0 refills | Status: AC

## 2022-02-24 MED ORDER — BENZONATATE 100 MG PO CAPS: 100.0000 mg | ORAL_CAPSULE | Freq: Three times a day (TID) | ORAL | 0 refills | Status: DC

## 2022-02-24 MED ORDER — PREDNISONE 20 MG PO TABS: 20.0000 mg | ORAL_TABLET | Freq: Every day | ORAL | 0 refills | Status: AC

## 2022-02-24 MED ORDER — IOHEXOL 350 MG/ML SOLN
75.0000 mL | Freq: Once | INTRAVENOUS | Status: AC | PRN
  Administered 2022-02-24: 75 mL via INTRAVENOUS

## 2022-02-24 MED ORDER — FLUTICASONE PROPIONATE 50 MCG/ACT NA SUSP: 2.0000 | Freq: Every day | NASAL | 0 refills | Status: DC

## 2022-02-24 NOTE — ED Triage Notes (Addendum)
Pt c/o difficulty breathing that worsened last night. Tested positive for Covid on Wednesday. O2 sat 90-95% on RA at home. HR 115-120bpm at home. Pt also c/o left sided chest pain that radiates up to the jaw that started last night. 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 Pt reports she took 2-650mg  Tylenol tablets 1 hour PTA.

## 2022-02-24 NOTE — ED Notes (Signed)
Ambulated Pt around the bed twice O2 stayed between 94% to 98%.

## 2022-02-24 NOTE — ED Provider Notes (Signed)
[facility] EMERGENCY DEPARTMENT Provider Note   CSN: [id number] Arrival date & time: 02/24/22  6283    History  Chief Complaint  Patient presents with   Chest Pain    [name] is a 61 y.o. female for evaluation history of CAD, MI, hyperlipidemia, hypertension here for evaluation of feeling unwell.  She has had chest pain and shortness of breath which began yesterday.  She has had prior stenting.  Tested positive for COVID on Wednesday.  She continues have some shortness of breath.  Tylenol PTA.  On arrival patient febrile, tachycardic and tachypneic however not hypoxic.  Feels like she cannot take a deep breath.  No emesis however she is nauseous.  No lower extremity swelling. Feels generally weak. Lots of coughing sputum and congestion. No abd pain, emesis. Intermittent fevers, taking home antipyretics. Sx began 4 days PTA. Took 2 at home COVID test which were all positive  The       Home Medications Prior to Admission medications   Medication Sig Start Date End Date Taking? Authorizing Provider  acetaminophen (TYLENOL) 650 MG CR tablet Take 650 mg by mouth every 8 (eight) hours as needed for pain or fever.   Yes [provider]  aspirin 81 MG tablet Take 1 tablet (81 mg total) by mouth daily. 08/17/17  Yes Talbert Forest, [place], DO  benzonatate (TESSALON) 100 MG capsule Take 1 capsule (100 mg total) by mouth every 8 (eight) hours. 02/24/22  Yes Fox Salminen A, PA-C  fluticasone (FLONASE) 50 MCG/ACT nasal spray Place 2 sprays into both nostrils daily. 02/24/22  Yes Cherilyn Sautter A, PA-C  molnupiravir EUA (LAGEVRIO) 200 mg CAPS capsule Take 4 capsules (800 mg total) by mouth 2 (two) times daily for 5 days. 02/24/22 03/01/22 Yes Gina Leblond A, PA-C  predniSONE (DELTASONE) 20 MG tablet Take 1 tablet (20 mg total) by mouth daily for 4 days. 02/24/22 02/28/22 Yes Jacinta Penalver A, PA-C      Allergies    Niacin, Niacin and related, and Penicillins    Review of Systems    Review of Systems  Constitutional:  Positive for activity change, appetite change, chills, fatigue and fever.  Respiratory:  Positive for cough and shortness of breath.   Cardiovascular:  Positive for chest pain.  Gastrointestinal:  Positive for nausea. Negative for abdominal distention, abdominal pain, anal bleeding, blood in stool, constipation, diarrhea and vomiting.  Genitourinary: Negative.   Musculoskeletal: Negative.   Skin: Negative.   Neurological:  Positive for weakness (generalized). Negative for dizziness, tremors, seizures, syncope, facial asymmetry, speech difficulty, light-headedness and numbness.  All other systems reviewed and are negative.   Physical Exam Updated Vital Signs BP 118/63   Pulse 89   Temp 98.4 F (36.9 C) (Oral)   Resp 17   Ht 5\' 6"  (1.676 m)   Wt 117.9 kg   SpO2 98%   BMI 41.97 kg/m  Physical Exam Vitals and nursing note reviewed.  Constitutional:      General: She is not in acute distress.    Appearance: She is well-developed. She is ill-appearing. She is not toxic-appearing or diaphoretic.  HENT:     Head: Atraumatic.  Eyes:     Pupils: Pupils are equal, round, and reactive to light.  Cardiovascular:     Rate and Rhythm: Tachycardia present.     Pulses:          Radial pulses are 2+ on the right side and 2+ on the left side.  Heart sounds: Normal heart sounds.  Pulmonary:     Effort: Pulmonary effort is normal. No respiratory distress.     Comments: Course lung sounds at bases BIL Chest:     Comments: Mild tenderness anterior chest wall Abdominal:     General: Bowel sounds are normal. There is no distension.     Palpations: Abdomen is soft.  Musculoskeletal:        General: Normal range of motion.     Cervical back: Normal range of motion.     Right lower leg: No tenderness. No edema.     Left lower leg: No tenderness. No edema.     Comments: No bony tenderness, full ROM  Skin:    General: Skin is warm and dry.   Neurological:     General: No focal deficit present.     Mental Status: She is alert.  Psychiatric:        Mood and Affect: Mood normal.     ED Results / Procedures / Treatments   Labs (all labs ordered are listed, but only abnormal results are displayed) Labs Reviewed  RESP PANEL BY RT-PCR (FLU A&B, COVID) ARPGX2 - Abnormal; Notable for the following components:      Result Value   SARS Coronavirus 2 by RT PCR POSITIVE (*)    All other components within normal limits  BASIC METABOLIC PANEL - Abnormal; Notable for the following components:   Glucose, Bld 138 (*)    All other components within normal limits  CBC - Abnormal; Notable for the following components:   RBC 5.15 (*)    All other components within normal limits  TROPONIN I (HIGH SENSITIVITY)  TROPONIN I (HIGH SENSITIVITY)    EKG None  Radiology CT Angio Chest PE W and/or Wo Contrast  Result Date: 02/24/2022 CLINICAL DATA:  Pleuritic chest pain. Chest tightness and shortness of breath. COVID positive. EXAM: CT ANGIOGRAPHY CHEST WITH CONTRAST TECHNIQUE: Multidetector CT imaging of the chest was performed using the standard protocol during bolus administration of intravenous contrast. Multiplanar CT image reconstructions and MIPs were obtained to evaluate the vascular anatomy. RADIATION DOSE REDUCTION: This exam was performed according to the departmental dose-optimization program which includes automated exposure control, adjustment of the mA and/or kV according to patient size and/or use of iterative reconstruction technique. CONTRAST:  43mL OMNIPAQUE IOHEXOL 350 MG/ML SOLN COMPARISON:  None Available. FINDINGS: Cardiovascular: The heart size is normal. No substantial pericardial effusion. Coronary artery calcification is evident. Mild atherosclerotic calcification is noted in the wall of the thoracic aorta. There is no filling defect within the opacified pulmonary arteries to suggest the presence of an acute pulmonary  embolus. Mediastinum/Nodes: No mediastinal lymphadenopathy. There is no hilar lymphadenopathy. The esophagus has normal imaging features. There is no axillary lymphadenopathy. Lungs/Pleura: No suspicious pulmonary nodule or mass. No focal airspace consolidation. There is no evidence of pleural effusion. Upper Abdomen: Unremarkable. Musculoskeletal: No worrisome lytic or sclerotic osseous abnormality. L1 compression fracture noted Review of the MIP images confirms the above findings. IMPRESSION: 1. No CT evidence for acute pulmonary embolus. 2. No acute findings in the chest. 3. L1 compression fracture, age indeterminate. 4. Aortic Atherosclerosis (ICD10-I70.0). Electronically Signed   By: Kennith Center M.D.   On: 02/24/2022 12:39   DG Chest 2 View  Result Date: 02/24/2022 CLINICAL DATA:  Chest pain, shortness of breath. EXAM: CHEST - 2 VIEW COMPARISON:  August 16, 2017. FINDINGS: The heart size and mediastinal contours are within normal limits. Both lungs  are clear. The visualized skeletal structures are unremarkable. IMPRESSION: No active cardiopulmonary disease. Electronically Signed   By: Lupita Raider M.D.   On: 02/24/2022 09:58    Procedures Procedures    Medications Ordered in ED Medications  ibuprofen (ADVIL) tablet 800 mg (800 mg Oral Given 02/24/22 1155)  sodium chloride 0.9 % bolus 1,000 mL (0 mLs Intravenous Stopped 02/24/22 1358)  ondansetron (ZOFRAN) injection 4 mg (4 mg Intravenous Given 02/24/22 1154)  iohexol (OMNIPAQUE) 350 MG/ML injection 75 mL (75 mLs Intravenous Contrast Given 02/24/22 1226)  predniSONE (DELTASONE) tablet 60 mg (60 mg Oral Given 02/24/22 1358)    ED Course/ Medical Decision Making/ A&P    61 year old here for evaluation of chest pain, shortness of breath.  Tested positive for COVID on Wednesday, 2 days PTA.  States her oxygen saturations have been 9095% on room air and she has been persistently tachycardic to 1 15-1 20.  Overall decreased appetite.  Anterior  chest wall pain and shortness of breath.  Feels like she cannot take a deep breath.  She was concerned as she has prior history of extensive cardiac history of prior MI with stent placement   Labs and imaging personally viewed and interpreted:  COVID-positive CBC without leukocytosis Metabolic panel without significant normality Delta troponin flat Chest x-ray without significant abnormality CTA negative for PE, PNA  Ambulatory oxygen greater than 94% on room air, 99% while sitting.  No tachycardia.  Reassessed.  Feels improved after steroids.  I have low suspicion for acute ACS, PE or dissection.  I suspect suspect her chest wall pain and shortness of breath is likely from her COVID.  Low suspicion for superimposed bacterial infection.  I discussed antiviral treatment which she is agreeable with.  Also wrote for additional symptomatic management.  She is ambulatory and that any hypoxia here, tolerating p.o. intake.  Will DC home for any new or worsening symptoms.  The patient has been appropriately medically screened and/or stabilized in the ED. I have low suspicion for any other emergent medical condition which would require further screening, evaluation or treatment in the ED or require inpatient management.  Patient is hemodynamically stable and in no acute distress.  Patient able to ambulate in department prior to ED.  Evaluation does not show acute pathology that would require ongoing or additional emergent interventions while in the emergency department or further inpatient treatment.  I have discussed the diagnosis with the patient and answered all questions.  Pain is been managed while in the emergency department and patient has no further complaints prior to discharge.  Patient is comfortable with plan discussed in room and is stable for discharge at this time.  I have discussed strict return precautions for returning to the emergency department.  Patient was encouraged to follow-up with  PCP/specialist refer to at discharge.                           Medical Decision Making Amount and/or Complexity of Data Reviewed External Data Reviewed: labs, radiology, ECG and notes. Labs: ordered. Decision-making details documented in ED Course. Radiology: ordered and independent interpretation performed. Decision-making details documented in ED Course. ECG/medicine tests: ordered and independent interpretation performed. Decision-making details documented in ED Course.  Risk OTC drugs. Prescription drug management. Parenteral controlled substances. Decision regarding hospitalization. Diagnosis or treatment significantly limited by social determinants of health.          Final Clinical Impression(s) / ED Diagnoses  Final diagnoses:  COVID  Chest wall pain  SOB (shortness of breath)    Rx / DC Orders ED Discharge Orders          Ordered    molnupiravir EUA (LAGEVRIO) 200 mg CAPS capsule  2 times daily        02/24/22 1507    predniSONE (DELTASONE) 20 MG tablet  Daily        02/24/22 1507    benzonatate (TESSALON) 100 MG capsule  Every 8 hours        02/24/22 1507    fluticasone (FLONASE) 50 MCG/ACT nasal spray  Daily        02/24/22 1507              Gennette Shadix A, PA-C 02/24/22 1513    Pricilla Loveless, MD 03/03/22 [phone]

## 2022-02-24 NOTE — Discharge Instructions (Signed)
COVID test is positive which was to be expected given her positive home test.  I written you for a few medications to help with your symptoms.  Take as prescribed.    Return for any worsening symptoms.  Your oxygen level was within normal limits here.  If you have any oxygen readings that are persistently in the 80s please seek reevaluation.

## 2022-05-03 ENCOUNTER — Ambulatory Visit (INDEPENDENT_AMBULATORY_CARE_PROVIDER_SITE_OTHER): Payer: 59 | Admitting: Orthopedic Surgery

## 2022-05-03 ENCOUNTER — Encounter: Payer: Self-pay | Admitting: Orthopedic Surgery

## 2022-05-03 VITALS — Ht 67.0 in | Wt 250.0 lb

## 2022-05-03 DIAGNOSIS — M25562 Pain in left knee: Secondary | ICD-10-CM | POA: Diagnosis not present

## 2022-05-05 ENCOUNTER — Encounter: Payer: Self-pay | Admitting: Orthopedic Surgery

## 2022-05-05 NOTE — Progress Notes (Signed)
New Patient Visit  Assessment: [name] is a 61 y.o. female with the following: 1. Acute pain of left knee  Plan: [name] has a recent history of a fracture in the left tibial plateau.  Over the past 1 to 2 weeks, she has noticed worsening pain.  MRI of the left knee is without meniscal pathology.  This could represent an arthritic flare.  I offered her steroid injection, and she elected proceed.  This was completed in clinic without issues.  Procedure note injection Left knee joint   Verbal consent was obtained to inject the left knee joint  Timeout was completed to confirm the site of injection.  The skin was prepped with alcohol and ethyl chloride was sprayed at the injection site.  A 21-gauge needle was used to inject 40 mg of Depo-Medrol and 1% lidocaine (3 cc) into the left knee using an anterolateral approach.  There were no complications. A sterile bandage was applied.     Follow-up: Return if symptoms worsen or fail to improve.  Subjective:  Chief Complaint  Patient presents with   Knee Pain    Lt knee pain w/ history of fx  07/09/21. Pt states knee has been weak for 1 wk.     History of Present Illness: [name] is a 61 y.o. female who presents for evaluation of left knee pain.  Earlier this year, she sustained a minimally displaced fracture of the left tibial plateau.  She was followed by Dr. Luna Glasgow.  This was treated without surgery.  She previously had an MRI, which was without additional pathology.  Over the past 1-2 weeks, she started to notice worsening pain in the left knee.  She states that it feels weak.  Medications are not helping.   Review of Systems: No fevers or chills No numbness or tingling No chest pain No shortness of breath No bowel or bladder dysfunction No GI distress No headaches   Medical History:  Past Medical History:  Diagnosis Date   CAD (coronary artery disease), premature-cutting balloon athrectomy LAD 2004, 3  DES-Promus to Prox LCX 2012   04/02/2012   Coronary artery disease    Hyperlipidemia LDL goal < 70 04/02/2012   Hypertension    Metabolic syndrome [id number]   Morbid obesity with BMI of 40.0-44.9, adult ([place]) 04/02/2012   Myocardial infarct ([place])    Pneumonia    hx of in 03/2010   Shortness of breath    Unstable angina ([place]) 04/02/2012    Past Surgical History:  Procedure Laterality Date   ABDOMINAL HYSTERECTOMY     ANKLE SURGERY     CORONARY ANGIOPLASTY     LEFT HEART CATHETERIZATION WITH CORONARY ANGIOGRAM N/A 04/03/2012   Procedure: LEFT HEART CATHETERIZATION WITH CORONARY ANGIOGRAM;  Surgeon: Troy Sine, MD;  Location: [facility] CATH LAB;  Service: Cardiovascular;  Laterality: N/A;   LEG SURGERY Right    plates and screws in right lower leg    Family History  Problem Relation Age of Onset   Coronary artery disease Mother    Cancer - Ovarian Mother    Coronary artery disease Father    Heart attack Brother    Social History   Tobacco Use   Smoking status: Former    Types: Cigarettes    Quit date: 06/12/1990    Years since quitting: 31.9   Smokeless tobacco: Never  Vaping Use   Vaping Use: Never used  Substance Use Topics   Alcohol use: Yes  Comment: occasionally   Drug use: No    Allergies  Allergen Reactions   Niacin Other (See Comments)   Niacin And Related    Penicillins Nausea And Vomiting    Pt reports the vomiting occurred when she was 61 y.o. and she has taken Penicillin since for dental procedure and tolerated it fine.     No outpatient medications have been marked as taking for the 05/03/22 encounter (Office Visit) with Oliver Barre, MD.    Objective: Ht 5\' 7"  (1.702 m)   Wt 250 lb (113.4 kg)   BMI 39.16 kg/m   Physical Exam:  General: Alert and oriented. and No acute distress. Gait: Left sided antalgic gait.  Evaluation left knee demonstrates mild effusion.  Tenderness palpation over the lateral joint line.  Negative Lachman.  No  increased laxity varus valgus stress.  She has some difficulty with range of motion, due to pain, but it is full otherwise.  IMAGING: No new imaging obtained today   New Medications:  No orders of the defined types were placed in this encounter.     , MD  05/05/2022 11:54 AM

## 2022-05-08 ENCOUNTER — Encounter: Payer: Self-pay | Admitting: Internal Medicine

## 2022-05-08 ENCOUNTER — Ambulatory Visit (INDEPENDENT_AMBULATORY_CARE_PROVIDER_SITE_OTHER): Payer: 59 | Admitting: Internal Medicine

## 2022-05-08 VITALS — BP 159/95 | HR 89 | Ht 67.0 in | Wt 263.0 lb

## 2022-05-08 DIAGNOSIS — Z0001 Encounter for general adult medical examination with abnormal findings: Secondary | ICD-10-CM | POA: Diagnosis not present

## 2022-05-08 DIAGNOSIS — I10 Essential (primary) hypertension: Secondary | ICD-10-CM

## 2022-05-08 DIAGNOSIS — E782 Mixed hyperlipidemia: Secondary | ICD-10-CM

## 2022-05-08 DIAGNOSIS — Z6841 Body Mass Index (BMI) 40.0 and over, adult: Secondary | ICD-10-CM

## 2022-05-08 DIAGNOSIS — I2510 Atherosclerotic heart disease of native coronary artery without angina pectoris: Secondary | ICD-10-CM

## 2022-05-08 DIAGNOSIS — Z90710 Acquired absence of both cervix and uterus: Secondary | ICD-10-CM

## 2022-05-08 DIAGNOSIS — Z1211 Encounter for screening for malignant neoplasm of colon: Secondary | ICD-10-CM | POA: Diagnosis not present

## 2022-05-08 DIAGNOSIS — Z23 Encounter for immunization: Secondary | ICD-10-CM

## 2022-05-08 DIAGNOSIS — Z1231 Encounter for screening mammogram for malignant neoplasm of breast: Secondary | ICD-10-CM | POA: Diagnosis not present

## 2022-05-08 DIAGNOSIS — Z1159 Encounter for screening for other viral diseases: Secondary | ICD-10-CM

## 2022-05-08 MED ORDER — ATORVASTATIN CALCIUM 40 MG PO TABS: 40.0000 mg | ORAL_TABLET | Freq: Every day | ORAL | 3 refills | Status: DC

## 2022-05-08 NOTE — Assessment & Plan Note (Signed)
Previously prescribed atorvastatin 80 mg daily.  Not currently taking any cholesterol-lowering medications. -Atorvastatin 40 mg daily restarted today -Repeat lipid panel ordered today

## 2022-05-08 NOTE — Assessment & Plan Note (Signed)
Not currently prescribed antihypertensive therapy.  Previously well controlled off of medication.  Her blood pressure today is 159/95. -No changes for now.  We will plan for follow-up in 4 weeks and repeat BP at that time.  If her blood pressure remains elevated, plan to start treatment at that time.

## 2022-05-08 NOTE — Patient Instructions (Signed)
It was a pleasure to see you today.  Thank you for giving [place] the opportunity to be involved in your care.  Below is a brief recap of your visit and next steps.  We will plan to see you again in 4 weeks.  Summary You have established care today. We will check labs and you will receive your flu shot. I have placed a referral for a mammogram Cologuard has been ordered for colon cancer screening We will start atorvastatin 40 mg daily for heart disease / cholesterol Follow up in 4 weeks

## 2022-05-08 NOTE — Assessment & Plan Note (Signed)
Presenting today to establish care.  Previous records and labs were reviewed. -Baseline labs ordered today, including one-time HCV screening -Influenza vaccine administered today -Cologuard ordered for colorectal cancer screening -Screening mammogram ordered today -Follow-up in 4 weeks to review labs and HTN check

## 2022-05-08 NOTE — Progress Notes (Signed)
New Patient Office Visit  Subjective    Patient ID: [name], female    DOB: Dec 22, 1960  Age: 61 y.o. MRN: [id number]  CC:  Chief Complaint  Patient presents with   Establish Care   HPI [name] presents to establish care.  She is a 61 year old woman with a past medical history significant for CAD s/p multiple PCI's, HTN, HLD, and morbid obesity.  She has not recently had a primary care provider.  [name]'s acute concerns today are significant weight gain over the last year that she attributes to multiple knee injuries and a vertebral compression fracture.  She became tearful when discussing the deterioration of her health over the last year and states that she is ready for things to change in 2024.  She reports feeling depressed, which she largely attributes to her weight gain and not being able to be as active as she previously was because of her knee injuries.  Acute concerns, chronic medical conditions, and outstanding preventative care items discussed today are individually addressed in A/P below.  Outpatient Encounter Medications as of 05/08/2022  Medication Sig   acetaminophen (TYLENOL) 650 MG CR tablet Take 650 mg by mouth every 8 (eight) hours as needed for pain or fever.   aspirin 81 MG tablet Take 1 tablet (81 mg total) by mouth daily.   atorvastatin (LIPITOR) 40 MG tablet Take 1 tablet (40 mg total) by mouth daily.   [DISCONTINUED] benzonatate (TESSALON) 100 MG capsule Take 1 capsule (100 mg total) by mouth every 8 (eight) hours.   [DISCONTINUED] fluticasone (FLONASE) 50 MCG/ACT nasal spray Place 2 sprays into both nostrils daily.   No facility-administered encounter medications on file as of 05/08/2022.    Past Medical History:  Diagnosis Date   CAD (coronary artery disease), premature-cutting balloon athrectomy LAD 2004, 3 DES-Promus to Prox LCX 2012   04/02/2012   Coronary artery disease    Hyperlipidemia LDL goal < 70 04/02/2012   Hypertension     Metabolic syndrome [id number]   Morbid obesity with BMI of 40.0-44.9, adult ([place]) 04/02/2012   Myocardial infarct ([place])    Pneumonia    hx of in 03/2010   Shortness of breath    Unstable angina ([place]) 04/02/2012    Past Surgical History:  Procedure Laterality Date   ABDOMINAL HYSTERECTOMY     ANKLE SURGERY     CORONARY ANGIOPLASTY     LEFT HEART CATHETERIZATION WITH CORONARY ANGIOGRAM N/A 04/03/2012   Procedure: LEFT HEART CATHETERIZATION WITH CORONARY ANGIOGRAM;  Surgeon: Troy Sine, MD;  Location: [facility] CATH LAB;  Service: Cardiovascular;  Laterality: N/A;   LEG SURGERY Right    plates and screws in right lower leg    Family History  Problem Relation Age of Onset   Coronary artery disease Mother    Cancer - Ovarian Mother    Coronary artery disease Father    Heart attack Brother     Social History   Socioeconomic History   Marital status: Divorced    Spouse name: Not on file   Number of children: Not on file   Years of education: Not on file   Highest education level: Not on file  Occupational History   Not on file  Tobacco Use   Smoking status: Former    Types: Cigarettes    Quit date: 06/12/1990    Years since quitting: 31.9   Smokeless tobacco: Never  Vaping Use   Vaping Use: Never used  Substance  and Sexual Activity   Alcohol use: Yes    Comment: occasionally   Drug use: No   Sexual activity: Not on file  Other Topics Concern   Not on file  Social History Narrative   Not on file   Social Determinants of Health   Financial Resource Strain: Not on file  Food Insecurity: Not on file  Transportation Needs: Not on file  Physical Activity: Not on file  Stress: Not on file  Social Connections: Not on file  Intimate Partner Violence: Not on file   Review of Systems  Musculoskeletal:  Positive for back pain (Lumbar spine) and joint pain (Bilateral knee pain).  Psychiatric/Behavioral:  Positive for depression.   All other systems reviewed and are  negative.  Objective    BP (!) 159/95   Pulse 89   Ht 5\' 7"  (1.702 m)   Wt 263 lb (119.3 kg)   SpO2 96%   BMI 41.19 kg/m   Physical Exam Vitals reviewed.  Constitutional:      General: She is not in acute distress.    Appearance: Normal appearance. She is obese. She is not toxic-appearing.  HENT:     Head: Normocephalic and atraumatic.     Right Ear: External ear normal.     Left Ear: External ear normal.     Nose: Nose normal. No congestion or rhinorrhea.     Mouth/Throat:     Mouth: Mucous membranes are moist.     Pharynx: Oropharynx is clear. No oropharyngeal exudate or posterior oropharyngeal erythema.  Eyes:     General: No scleral icterus.    Extraocular Movements: Extraocular movements intact.     Conjunctiva/sclera: Conjunctivae normal.     Pupils: Pupils are equal, round, and reactive to light.  Cardiovascular:     Rate and Rhythm: Normal rate and regular rhythm.     Pulses: Normal pulses.     Heart sounds: Normal heart sounds. No murmur heard.    No friction rub. No gallop.  Pulmonary:     Effort: Pulmonary effort is normal.     Breath sounds: Normal breath sounds. No wheezing, rhonchi or rales.  Abdominal:     General: Abdomen is flat. Bowel sounds are normal. There is no distension.     Palpations: Abdomen is soft.     Tenderness: There is no abdominal tenderness.  Musculoskeletal:        General: No swelling. Normal range of motion.     Cervical back: Normal range of motion.     Right lower leg: No edema.     Left lower leg: No edema.  Lymphadenopathy:     Cervical: No cervical adenopathy.  Skin:    General: Skin is warm and dry.     Capillary Refill: Capillary refill takes less than 2 seconds.     Coloration: Skin is not jaundiced.  Neurological:     General: No focal deficit present.     Mental Status: She is alert and oriented to person, place, and time.  Psychiatric:        Mood and Affect: Mood normal.        Behavior: Behavior normal.     Assessment & Plan:   Problem List Items Addressed This Visit       CAD (coronary artery disease), premature;  cutting balloon athrectomy LAD 2004, 3 DES-Promus to Prox LCX 2012   (Chronic)    History of CAD with multiple prior PCI's.  She was previously followed by  cardiology [facility]) but has not been seen in several years.  She is interested in reestablishing care today.  She is currently taking ASA 81 mg daily but is not on statin therapy. -Referral placed to cardiology to establish care -Through shared decision making, she is in agreement with restarting atorvastatin 40 mg daily given her history of previous MI and HLD      HTN (hypertension) (Chronic)    Not currently prescribed antihypertensive therapy.  Previously well controlled off of medication.  Her blood pressure today is 159/95. -No changes for now.  We will plan for follow-up in 4 weeks and repeat BP at that time.  If her blood pressure remains elevated, plan to start treatment at that time.      Morbid obesity with BMI of 40.0-44.9, adult (HCC) (Chronic)    BMI 41 today.  She endorses significant weight gain over the last year that is attributed to multiple knee injuries and being sedentary as result she is very motivated to lose weight and interested in speaking with a dietitian. -I have placed referral to nutrition today -Baseline labs have been ordered      Hyperlipidemia (Chronic)    Previously prescribed atorvastatin 80 mg daily.  Not currently taking any cholesterol-lowering medications. -Atorvastatin 40 mg daily restarted today -Repeat lipid panel ordered today      Encounter for general adult medical examination with abnormal findings    Presenting today to establish care.  Previous records and labs were reviewed. -Baseline labs ordered today, including one-time HCV screening -Influenza vaccine administered today -Cologuard ordered for colorectal cancer screening -Screening mammogram ordered  today -Follow-up in 4 weeks to review labs and HTN check      Return in about 4 weeks (around 06/05/2022) for Review labs, HTN.   Billie Lade, MD

## 2022-05-08 NOTE — Assessment & Plan Note (Signed)
BMI 41 today.  She endorses significant weight gain over the last year that is attributed to multiple knee injuries and being sedentary as result she is very motivated to lose weight and interested in speaking with a dietitian. -I have placed referral to nutrition today -Baseline labs have been ordered

## 2022-05-08 NOTE — Assessment & Plan Note (Signed)
History of CAD with multiple prior PCI's.  She was previously followed by cardiology [facility]) but has not been seen in several years.  She is interested in reestablishing care today.  She is currently taking ASA 81 mg daily but is not on statin therapy. -Referral placed to cardiology to establish care -Through shared decision making, she is in agreement with restarting atorvastatin 40 mg daily given her history of previous MI and HLD

## 2022-05-09 LAB — CMP14+EGFR
ALT: 15 IU/L (ref 0–32)
AST: 14 IU/L (ref 0–40)
Albumin/Globulin Ratio: 1.3 (ref 1.2–2.2)
Albumin: 4.3 g/dL (ref 3.9–4.9)
Alkaline Phosphatase: 94 IU/L (ref 44–121)
BUN/Creatinine Ratio: 18 (ref 12–28)
BUN: 15 mg/dL (ref 8–27)
Bilirubin Total: 0.2 mg/dL (ref 0.0–1.2)
CO2: 22 mmol/L (ref 20–29)
Calcium: 10.2 mg/dL (ref 8.7–10.3)
Chloride: 100 mmol/L (ref 96–106)
Creatinine, Ser: 0.82 mg/dL (ref 0.57–1.00)
Globulin, Total: 3.2 g/dL (ref 1.5–4.5)
Glucose: 90 mg/dL (ref 70–99)
Potassium: 4.8 mmol/L (ref 3.5–5.2)
Sodium: 138 mmol/L (ref 134–144)
Total Protein: 7.5 g/dL (ref 6.0–8.5)
eGFR: 81 mL/min/{1.73_m2} (ref >59)

## 2022-05-09 LAB — CBC WITH DIFFERENTIAL/PLATELET
Basophils Absolute: 0.1 10*3/uL (ref 0.0–0.2)
Basos: 1 %
EOS (ABSOLUTE): 0.2 10*3/uL (ref 0.0–0.4)
Eos: 2 %
Hematocrit: 43.2 % (ref 34.0–46.6)
Hemoglobin: 14.5 g/dL (ref 11.1–15.9)
Immature Grans (Abs): 0 10*3/uL (ref 0.0–0.1)
Immature Granulocytes: 0 %
Lymphocytes Absolute: 3.3 10*3/uL — ABNORMAL HIGH (ref 0.7–3.1)
Lymphs: 32 %
MCH: 27.7 pg (ref 26.6–33.0)
MCHC: 33.6 g/dL (ref 31.5–35.7)
MCV: 83 fL (ref 79–97)
Monocytes Absolute: 1 10*3/uL — ABNORMAL HIGH (ref 0.1–0.9)
Monocytes: 10 %
Neutrophils Absolute: 5.9 10*3/uL (ref 1.4–7.0)
Neutrophils: 55 %
Platelets: 269 10*3/uL (ref 150–450)
RBC: 5.23 x10E6/uL (ref 3.77–5.28)
RDW: 13.2 % (ref 11.7–15.4)
WBC: 10.6 10*3/uL (ref 3.4–10.8)

## 2022-05-09 LAB — HCV AB W REFLEX TO QUANT PCR: HCV Ab: NONREACTIVE

## 2022-05-09 LAB — B12 AND FOLATE PANEL
Folate: 16.6 ng/mL (ref >3.0)
Vitamin B-12: 566 pg/mL (ref 232–1245)

## 2022-05-09 LAB — LIPID PANEL
Chol/HDL Ratio: 4 ratio (ref 0.0–4.4)
Cholesterol, Total: 226 mg/dL — ABNORMAL HIGH (ref 100–199)
HDL: 57 mg/dL (ref >39)
LDL Chol Calc (NIH): 133 mg/dL — ABNORMAL HIGH (ref 0–99)
Triglycerides: 205 mg/dL — ABNORMAL HIGH (ref 0–149)
VLDL Cholesterol Cal: 36 mg/dL (ref 5–40)

## 2022-05-09 LAB — TSH+FREE T4
Free T4: 1.26 ng/dL (ref 0.82–1.77)
TSH: 1.12 u[IU]/mL (ref 0.450–4.500)

## 2022-05-09 LAB — HCV INTERPRETATION

## 2022-05-09 LAB — VITAMIN D 25 HYDROXY (VIT D DEFICIENCY, FRACTURES): Vit D, 25-Hydroxy: 34.7 ng/mL (ref 30.0–100.0)

## 2022-05-09 LAB — HEMOGLOBIN A1C
Est. average glucose Bld gHb Est-mCnc: 131 mg/dL
Hgb A1c MFr Bld: 6.2 % — ABNORMAL HIGH (ref 4.8–5.6)

## 2022-05-15 ENCOUNTER — Other Ambulatory Visit: Payer: Self-pay | Admitting: Internal Medicine

## 2022-05-15 DIAGNOSIS — Z23 Encounter for immunization: Secondary | ICD-10-CM

## 2022-05-15 DIAGNOSIS — Z1211 Encounter for screening for malignant neoplasm of colon: Secondary | ICD-10-CM

## 2022-05-15 DIAGNOSIS — I10 Essential (primary) hypertension: Secondary | ICD-10-CM

## 2022-05-15 DIAGNOSIS — Z1231 Encounter for screening mammogram for malignant neoplasm of breast: Secondary | ICD-10-CM

## 2022-05-15 DIAGNOSIS — Z1159 Encounter for screening for other viral diseases: Secondary | ICD-10-CM

## 2022-05-15 DIAGNOSIS — E782 Mixed hyperlipidemia: Secondary | ICD-10-CM

## 2022-05-15 DIAGNOSIS — Z90710 Acquired absence of both cervix and uterus: Secondary | ICD-10-CM

## 2022-05-15 DIAGNOSIS — I2510 Atherosclerotic heart disease of native coronary artery without angina pectoris: Secondary | ICD-10-CM

## 2022-05-15 DIAGNOSIS — Z0001 Encounter for general adult medical examination with abnormal findings: Secondary | ICD-10-CM

## 2022-06-07 ENCOUNTER — Encounter: Payer: Self-pay | Admitting: Internal Medicine

## 2022-06-07 ENCOUNTER — Ambulatory Visit (HOSPITAL_COMMUNITY)
Admission: RE | Admit: 2022-06-07 | Discharge: 2022-06-07 | Disposition: A | Discharge status: Home / Self Care | Payer: 59 | Source: Ambulatory Visit | Attending: Internal Medicine | Admitting: Internal Medicine

## 2022-06-07 ENCOUNTER — Ambulatory Visit (INDEPENDENT_AMBULATORY_CARE_PROVIDER_SITE_OTHER): Payer: 59 | Admitting: Internal Medicine

## 2022-06-07 VITALS — BP 140/83 | HR 99 | Resp 16 | Ht 67.0 in | Wt 266.0 lb

## 2022-06-07 DIAGNOSIS — M7918 Myalgia, other site: Secondary | ICD-10-CM

## 2022-06-07 DIAGNOSIS — G8929 Other chronic pain: Secondary | ICD-10-CM

## 2022-06-07 DIAGNOSIS — Z1231 Encounter for screening mammogram for malignant neoplasm of breast: Secondary | ICD-10-CM | POA: Insufficient documentation

## 2022-06-07 DIAGNOSIS — F329 Major depressive disorder, single episode, unspecified: Secondary | ICD-10-CM

## 2022-06-07 DIAGNOSIS — I2510 Atherosclerotic heart disease of native coronary artery without angina pectoris: Secondary | ICD-10-CM

## 2022-06-07 DIAGNOSIS — I10 Essential (primary) hypertension: Secondary | ICD-10-CM | POA: Diagnosis not present

## 2022-06-07 DIAGNOSIS — E782 Mixed hyperlipidemia: Secondary | ICD-10-CM

## 2022-06-07 DIAGNOSIS — R69 Illness, unspecified: Secondary | ICD-10-CM | POA: Diagnosis not present

## 2022-06-07 DIAGNOSIS — Z6841 Body Mass Index (BMI) 40.0 and over, adult: Secondary | ICD-10-CM | POA: Diagnosis not present

## 2022-06-07 DIAGNOSIS — Z1331 Encounter for screening for depression: Secondary | ICD-10-CM | POA: Diagnosis not present

## 2022-06-07 MED ORDER — DULOXETINE HCL 30 MG PO CPEP: 30.0000 mg | ORAL_CAPSULE | Freq: Every day | ORAL | 2 refills | Status: DC

## 2022-06-07 MED ORDER — METOPROLOL SUCCINATE ER 25 MG PO TB24: 25.0000 mg | ORAL_TABLET | Freq: Every day | ORAL | 3 refills | Status: DC

## 2022-06-07 NOTE — Progress Notes (Signed)
Established Patient Office Visit  Subjective   Patient ID: [name], female    DOB: 1960/09/12  Age: 61 y.o. MRN: [id number]  Chief Complaint  Patient presents with   Hyperlipidemia    Follow up visit- had mammogram completed today.   [name] returns to care today.  She was last seen by me on 11/27 to establish care.  Atorvastatin 4 mg daily was started at that time and 4-week follow-up was arranged for HTN check.  She has completed her mammogram in the interim.  There have otherwise been no acute interval events.  Today [name] states that she feels well.  She is asymptomatic and has no acute concerns to discuss.  Past Medical History:  Diagnosis Date   CAD (coronary artery disease), premature-cutting balloon athrectomy LAD 2004, 3 DES-Promus to Prox LCX 2012   04/02/2012   Coronary artery disease    Hyperlipidemia LDL goal < 70 04/02/2012   Hypertension    Metabolic syndrome 06/20/3233   Morbid obesity with BMI of 40.0-44.9, adult ([place]) 04/02/2012   Myocardial infarct ([place])    Pneumonia    hx of in 03/2010   Shortness of breath    Unstable angina ([place]) 04/02/2012   Past Surgical History:  Procedure Laterality Date   ABDOMINAL HYSTERECTOMY     ANKLE SURGERY     CORONARY ANGIOPLASTY     LEFT HEART CATHETERIZATION WITH CORONARY ANGIOGRAM N/A 04/03/2012   Procedure: LEFT HEART CATHETERIZATION WITH CORONARY ANGIOGRAM;  Surgeon: Troy Sine, MD;  Location: [facility] CATH LAB;  Service: Cardiovascular;  Laterality: N/A;   LEG SURGERY Right    plates and screws in right lower leg   Social History   Tobacco Use   Smoking status: Former    Types: Cigarettes    Quit date: 06/12/1990    Years since quitting: 32.0   Smokeless tobacco: Never  Vaping Use   Vaping Use: Never used  Substance Use Topics   Alcohol use: Yes    Comment: occasionally   Drug use: No   Family History  Problem Relation Age of Onset   Coronary artery disease Mother    Cancer - Ovarian Mother     Coronary artery disease Father    Heart attack Brother    Allergies  Allergen Reactions   Niacin Other (See Comments)   Niacin And Related    Penicillins Nausea And Vomiting    Pt reports the vomiting occurred when she was 61 y.o. and she has taken Penicillin since for dental procedure and tolerated it fine.    Statins    Review of Systems  Constitutional:  Negative for chills and fever.  HENT:  Negative for sore throat.   Respiratory:  Negative for cough and shortness of breath.   Cardiovascular:  Negative for chest pain, palpitations and leg swelling.  Gastrointestinal:  Negative for abdominal pain, blood in stool, constipation, diarrhea, nausea and vomiting.  Genitourinary:  Negative for dysuria and hematuria.  Musculoskeletal:  Negative for myalgias.  Skin:  Negative for itching and rash.  Neurological:  Negative for dizziness and headaches.  Psychiatric/Behavioral:  Negative for depression and suicidal ideas.      Objective:     BP (!) 140/83   Pulse 99   Resp 16   Ht _0  (1.702 m)   Wt 266 lb (120.7 kg)   SpO2 96%   BMI 41.66 kg/m  BP Readings from Last 3 Encounters:  06/07/22 (!) 140/83  05/08/22 Marland Kitchen)  159/95  02/24/22 (!) 105/55   Physical Exam Vitals reviewed.  Constitutional:      General: She is not in acute distress.    Appearance: Normal appearance. She is obese. She is not toxic-appearing.  HENT:     Head: Normocephalic and atraumatic.     Right Ear: External ear normal.     Left Ear: External ear normal.     Nose: Nose normal. No congestion or rhinorrhea.     Mouth/Throat:     Mouth: Mucous membranes are moist.     Pharynx: Oropharynx is clear. No oropharyngeal exudate or posterior oropharyngeal erythema.  Eyes:     General: No scleral icterus.    Extraocular Movements: Extraocular movements intact.     Conjunctiva/sclera: Conjunctivae normal.     Pupils: Pupils are equal, round, and reactive to light.  Cardiovascular:     Rate and Rhythm:  Normal rate and regular rhythm.     Pulses: Normal pulses.     Heart sounds: Normal heart sounds. No murmur heard.    No friction rub. No gallop.  Pulmonary:     Effort: Pulmonary effort is normal.     Breath sounds: Normal breath sounds. No wheezing, rhonchi or rales.  Abdominal:     General: Abdomen is flat. Bowel sounds are normal. There is no distension.     Palpations: Abdomen is soft.     Tenderness: There is no abdominal tenderness.  Musculoskeletal:        General: No swelling. Normal range of motion.     Cervical back: Normal range of motion.     Right lower leg: No edema.     Left lower leg: No edema.  Lymphadenopathy:     Cervical: No cervical adenopathy.  Skin:    General: Skin is warm and dry.     Capillary Refill: Capillary refill takes less than 2 seconds.     Coloration: Skin is not jaundiced.  Neurological:     General: No focal deficit present.     Mental Status: She is alert and oriented to person, place, and time.  Psychiatric:        Mood and Affect: Mood normal.        Behavior: Behavior normal.    Last CBC Lab Results  Component Value Date   WBC 10.6 05/08/2022   HGB 14.5 05/08/2022   HCT 43.2 05/08/2022   MCV 83 05/08/2022   MCH 27.7 05/08/2022   RDW 13.2 05/08/2022   PLT 269 33/29/5188   Last metabolic panel Lab Results  Component Value Date   GLUCOSE 90 05/08/2022   NA 138 05/08/2022   K 4.8 05/08/2022   CL 100 05/08/2022   CO2 22 05/08/2022   BUN 15 05/08/2022   CREATININE 0.82 05/08/2022   EGFR 81 05/08/2022   CALCIUM 10.2 05/08/2022   PROT 7.5 05/08/2022   ALBUMIN 4.3 05/08/2022   LABGLOB 3.2 05/08/2022   AGRATIO 1.3 05/08/2022   BILITOT 0.2 05/08/2022   ALKPHOS 94 05/08/2022   AST 14 05/08/2022   ALT 15 05/08/2022   ANIONGAP 10 02/24/2022   Last lipids Lab Results  Component Value Date   CHOL 226 (H) 05/08/2022   HDL 57 05/08/2022   LDLCALC 133 (H) 05/08/2022   TRIG 205 (H) 05/08/2022   CHOLHDL 4.0 05/08/2022   Last  hemoglobin A1c Lab Results  Component Value Date   HGBA1C 6.2 (H) 05/08/2022   Last thyroid functions Lab Results  Component Value Date  TSH 1.120 05/08/2022   Last vitamin D Lab Results  Component Value Date   VD25OH 34.7 05/08/2022   Last vitamin B12 and Folate Lab Results  Component Value Date   BZXYDSWV79 150 05/08/2022   FOLATE 16.6 05/08/2022   The 10-year ASCVD risk score (Arnett DK, et al., 2019) is: 6.9%    Assessment & Plan:   Problem List Items Addressed This Visit       CAD (coronary artery disease), premature;  cutting balloon athrectomy LAD 2004, 3 DES-Promus to Prox LCX 2012   - Primary (Chronic)    Denies recent chest pain.  Atorvastatin 40 mg daily was started at her last appointment.  Adding beta-blocker therapy today.  She was recently referred to cardiology and has an appointment scheduled for February.      HTN (hypertension) (Chronic)    Not currently on any antihypertensive therapy but has previously been diagnosed with hypertension.  Her blood pressure remains mildly elevated today, 140/83. -Start metoprolol succinate 25 mg daily today      Morbid obesity with BMI of 40.0-44.9, adult (HCC) (Chronic)    BMI 41.6 today.  She was referred to nutrition at her last appointment at her request and has an appointment scheduled in early January.      Hyperlipidemia (Chronic)    Atorvastatin 40 mg daily was restarted at her last appointment due to significant cardiac history.  She has not experienced any adverse side effects.      Positive screening for depression on 9-item Patient Health Questionnaire (PHQ-9)    PHQ-9 score 27 today.  Currently denies SI/HI.  She attributes many of her symptoms to chronic musculoskeletal pain as well as family stress.  She is interested in starting a medication today. -Cymbalta 30 mg daily has been prescribed for treatment of depression as well as chronic musculoskeletal pain.  Can increase to 60 mg daily if needed.       Return in about 2 months (around 08/08/2022).    Johnette Abraham, MD

## 2022-06-07 NOTE — Patient Instructions (Signed)
It was a pleasure to see you today.  Thank you for giving [place] the opportunity to be involved in your care.  Below is a brief recap of your visit and next steps.  We will plan to see you again in 2 months.  Summary Start Cymbalta 30 mg today for depression and musculoskeletal pain Start metoprolol for hypertension and heart disease Follow up in 2 months

## 2022-06-08 ENCOUNTER — Ambulatory Visit: Payer: 59 | Admitting: Internal Medicine

## 2022-06-13 ENCOUNTER — Other Ambulatory Visit (HOSPITAL_COMMUNITY): Payer: Self-pay | Admitting: Internal Medicine

## 2022-06-13 DIAGNOSIS — Z1331 Encounter for screening for depression: Secondary | ICD-10-CM | POA: Insufficient documentation

## 2022-06-13 DIAGNOSIS — R928 Other abnormal and inconclusive findings on diagnostic imaging of breast: Secondary | ICD-10-CM

## 2022-06-13 NOTE — Assessment & Plan Note (Addendum)
Not currently on any antihypertensive therapy but has previously been diagnosed with hypertension.  Her blood pressure remains mildly elevated today, 140/83. -Start metoprolol succinate 25 mg daily today

## 2022-06-13 NOTE — Assessment & Plan Note (Addendum)
PHQ-9 score 27 today.  Currently denies SI/HI.  She attributes many of her symptoms to chronic musculoskeletal pain as well as family stress.  She is interested in starting a medication today. -Cymbalta 30 mg daily has been prescribed for treatment of depression as well as chronic musculoskeletal pain.  Can increase to 60 mg daily if needed.

## 2022-06-13 NOTE — Assessment & Plan Note (Signed)
Atorvastatin 40 mg daily was restarted at her last appointment due to significant cardiac history.  She has not experienced any adverse side effects.

## 2022-06-13 NOTE — Assessment & Plan Note (Signed)
Denies recent chest pain.  Atorvastatin 40 mg daily was started at her last appointment.  Adding beta-blocker therapy today.  She was recently referred to cardiology and has an appointment scheduled for February.

## 2022-06-13 NOTE — Assessment & Plan Note (Signed)
BMI 41.6 today.  She was referred to nutrition at her last appointment at her request and has an appointment scheduled in early January.

## 2022-06-15 ENCOUNTER — Ambulatory Visit (HOSPITAL_COMMUNITY)
Admission: RE | Admit: 2022-06-15 | Discharge: 2022-06-15 | Disposition: A | Discharge status: Home / Self Care | Payer: 59 | Source: Ambulatory Visit | Attending: Internal Medicine | Admitting: Internal Medicine

## 2022-06-15 DIAGNOSIS — N6489 Other specified disorders of breast: Secondary | ICD-10-CM | POA: Diagnosis not present

## 2022-06-15 DIAGNOSIS — R928 Other abnormal and inconclusive findings on diagnostic imaging of breast: Secondary | ICD-10-CM

## 2022-06-19 ENCOUNTER — Telehealth: Payer: Self-pay | Admitting: Orthopedic Surgery

## 2022-06-19 NOTE — Telephone Encounter (Signed)
Patient called, lvm stating that she saw Dr. Amedeo Kinsman in [place] back in 05/03/22.  She wants to see Dr. Amedeo Kinsman as soon as she can in the [place] office.  The appt note for the [facility] visit on 05/03/22 says she is a Dr. Aline Brochure pt.  Ok to schedule w/Dr. Amedeo Kinsman?  Pt's # [phone]

## 2022-06-20 ENCOUNTER — Ambulatory Visit (INDEPENDENT_AMBULATORY_CARE_PROVIDER_SITE_OTHER): Payer: 59

## 2022-06-20 ENCOUNTER — Encounter: Payer: Self-pay | Admitting: Orthopedic Surgery

## 2022-06-20 ENCOUNTER — Ambulatory Visit: Payer: 59 | Admitting: Orthopedic Surgery

## 2022-06-20 VITALS — BP 121/83 | HR 78 | Ht 67.0 in | Wt 268.0 lb

## 2022-06-20 DIAGNOSIS — M25562 Pain in left knee: Secondary | ICD-10-CM | POA: Diagnosis not present

## 2022-06-20 DIAGNOSIS — G8929 Other chronic pain: Secondary | ICD-10-CM

## 2022-06-20 DIAGNOSIS — M1712 Unilateral primary osteoarthritis, left knee: Secondary | ICD-10-CM

## 2022-06-20 NOTE — Patient Instructions (Signed)
Knee Exercises  Ask your health care provider which exercises are safe for you. Do exercises exactly as told by your health care provider and adjust them as directed. It is normal to feel mild stretching, pulling, tightness, or discomfort as you do these exercises. Stop right away if you feel sudden pain or your pain gets worse. Do not begin these exercises until told by your health care provider.  Stretching and range-of-motion exercises These exercises warm up your muscles and joints and improve the movement and flexibility of your knee. These exercises also help to relieve pain and swelling.  Knee extension, prone Lie on your abdomen (prone position) on a bed. Place your left / right knee just beyond the edge of the surface so your knee is not on the bed. You can put a towel under your left / right thigh just above your kneecap for comfort. Relax your leg muscles and allow gravity to straighten your knee (extension). You should feel a stretch behind your left / right knee. Hold this position for 10 seconds. Scoot up so your knee is supported between repetitions. Repeat 10 times. Complete this exercise 3-4 times per week.     Knee flexion, active Lie on your back with both legs straight. If this causes back discomfort, bend your left / right knee so your foot is flat on the floor. Slowly slide your left / right heel back toward your buttocks. Stop when you feel a gentle stretch in the front of your knee or thigh (flexion). Hold this position for 10 seconds. Slowly slide your left / right heel back to the starting position. Repeat 10 times. Complete this exercise 3-4 times per week.      Quadriceps stretch, prone Lie on your abdomen on a firm surface, such as a bed or padded floor. Bend your left / right knee and hold your ankle. If you cannot reach your ankle or pant leg, loop a belt around your foot and grab the belt instead. Gently pull your heel toward your buttocks. Your knee should  not slide out to the side. You should feel a stretch in the front of your thigh and knee (quadriceps). Hold this position for 10 seconds. Repeat 10 times. Complete this exercise 3-4 times per week.      Hamstring, supine Lie on your back (supine position). Loop a belt or towel over the ball of your left / right foot. The ball of your foot is on the walking surface, right under your toes. Straighten your left / right knee and slowly pull on the belt to raise your leg until you feel a gentle stretch behind your knee (hamstring). Do not let your knee bend while you do this. Keep your other leg flat on the floor. Hold this position for 10 seconds. Repeat 10 times. Complete this exercise 3-4 times per week.   Strengthening exercises These exercises build strength and endurance in your knee. Endurance is the ability to use your muscles for a long time, even after they get tired.  Quadriceps, isometric This exercise stretches the muscles in front of your thigh (quadriceps) without moving your knee joint (isometric). Lie on your back with your left / right leg extended and your other knee bent. Put a rolled towel or small pillow under your knee if told by your health care provider. Slowly tense the muscles in the front of your left / right thigh. You should see your kneecap slide up toward your hip or see increased dimpling  just above the knee. This motion will push the back of the knee toward the floor. For 10 seconds, hold the muscle as tight as you can without increasing your pain. Relax the muscles slowly and completely. Repeat 10 times. Complete this exercise 3-4 times per week. .     Straight leg raises This exercise stretches the muscles in front of your thigh (quadriceps) and the muscles that move your hips (hip flexors). Lie on your back with your left / right leg extended and your other knee bent. Tense the muscles in the front of your left / right thigh. You should see your kneecap  slide up or see increased dimpling just above the knee. Your thigh may even shake a bit. Keep these muscles tight as you raise your leg 4-6 inches (10-15 cm) off the floor. Do not let your knee bend. Hold this position for 10 seconds. Keep these muscles tense as you lower your leg. Relax your muscles slowly and completely after each repetition. Repeat 10 times. Complete this exercise 3-4 times per week.  Hamstring, isometric Lie on your back on a firm surface. Bend your left / right knee about 30 degrees. Dig your left / right heel into the surface as if you are trying to pull it toward your buttocks. Tighten the muscles in the back of your thighs (hamstring) to "dig" as hard as you can without increasing any pain. Hold this position for 10 seconds. Release the tension gradually and allow your muscles to relax completely for __________ seconds after each repetition. Repeat 10 times. Complete this exercise 3-4 times per week.  Hamstring curls If told by your health care provider, do this exercise while wearing ankle weights. Begin with 5 lb weights. Then increase the weight by 1 lb (0.5 kg) increments. You can also use an exercise band Lie on your abdomen with your legs straight. Bend your left / right knee as far as you can without feeling pain. Keep your hips flat against the floor. Hold this position for 10 seconds. Slowly lower your leg to the starting position. Repeat 10 times. Complete this exercise 3-4 times per week.      Squats This exercise strengthens the muscles in front of your thigh and knee (quadriceps). Stand in front of a table, with your feet and knees pointing straight ahead. You may rest your hands on the table for balance but not for support. Slowly bend your knees and lower your hips like you are going to sit in a chair. Keep your weight over your heels, not over your toes. Keep your lower legs upright so they are parallel with the table legs. Do not let your hips  go lower than your knees. Do not bend lower than told by your health care provider. If your knee pain increases, do not bend as low. Hold the squat position for 10 seconds. Slowly push with your legs to return to standing. Do not use your hands to pull yourself to standing. Repeat 10 times. Complete this exercise 3-4 times per week .     Wall slides This exercise strengthens the muscles in front of your thigh and knee (quadriceps). Lean your back against a smooth wall or door, and walk your feet out 18-24 inches (46-61 cm) from it. Place your feet hip-width apart. Slowly slide down the wall or door until your knees bend 90 degrees. Keep your knees over your heels, not over your toes. Keep your knees in line with your hips. Hold   this position for 10 seconds. Repeat 10 times. Complete this exercise 3-4 times per week.      Straight leg raises This exercise strengthens the muscles that rotate the leg at the hip and move it away from your body (hip abductors). Lie on your side with your left / right leg in the top position. Lie so your head, shoulder, knee, and hip line up. You may bend your bottom knee to help you keep your balance. Roll your hips slightly forward so your hips are stacked directly over each other and your left / right knee is facing forward. Leading with your heel, lift your top leg 4-6 inches (10-15 cm). You should feel the muscles in your outer hip lifting. Do not let your foot drift forward. Do not let your knee roll toward the ceiling. Hold this position for 10 seconds. Slowly return your leg to the starting position. Let your muscles relax completely after each repetition. Repeat 10 times. Complete this exercise 3-4 times per week.      Straight leg raises This exercise stretches the muscles that move your hips away from the front of the pelvis (hip extensors). Lie on your abdomen on a firm surface. You can put a pillow under your hips if that is more  comfortable. Tense the muscles in your buttocks and lift your left / right leg about 4-6 inches (10-15 cm). Keep your knee straight as you lift your leg. Hold this position for 10 seconds. Slowly lower your leg to the starting position. Let your leg relax completely after each repetition. Repeat 10 times. Complete this exercise 3-4 times per week.    Sciatica Rehab  Ask your health care provider which exercises are safe for you. Do exercises exactly as told by your health care provider and adjust them as directed. It is normal to feel mild stretching, pulling, tightness, or discomfort as you do these exercises. Stop right away if you feel sudden pain or your pain gets worse. Do not begin these exercises until told by your health care provider.   Stretching and range-of-motion exercises These exercises warm up your muscles and joints and improve the movement and flexibility of your hips and back. These exercises also help to relieve pain, numbness, and tingling. Sciatic nerve glide Sit in a chair with your head facing down toward your chest. Place your hands behind your back. Let your shoulders slump forward. Slowly straighten one of your legs while you tilt your head back as if you are looking toward the ceiling. Only straighten your leg as far as you can without making your symptoms worse. Hold this position for 10 seconds. Slowly return to the starting position. Repeat with your other leg. Repeat 10 times. Complete this exercise 1-2 times a day. Knee to chest with hip adduction and internal rotation  Lie on your back on a firm surface with both legs straight. Bend one of your knees and move it up toward your chest until you feel a gentle stretch in your lower back and buttock. Then, move your knee toward the shoulder that is on the opposite side from your leg. This is hip adduction and internal rotation. Hold your leg in this position by holding on to the front of your knee. Hold this  position for 10 seconds. Slowly return to the starting position. Repeat with your other leg. Repeat 10 times. Complete this exercise 1-2 times a day. Prone extension on elbows  Lie on your abdomen on a firm surface.  A bed may be too soft for this exercise. Prop yourself up on your elbows. Use your arms to help lift your chest up until you feel a gentle stretch in your abdomen and your lower back. This will place some of your body weight on your elbows. If this is uncomfortable, try stacking pillows under your chest. Your hips should stay down, against the surface that you are lying on. Keep your hip and back muscles relaxed. Hold this position for 10 seconds. Slowly relax your upper body and return to the starting position. Repeat 10 times. Complete this exercise 1-2 times a day. Strengthening exercises These exercises build strength and endurance in your back. Endurance is the ability to use your muscles for a long time, even after they get tired. Pelvic tilt This exercise strengthens the muscles that lie deep in the abdomen. Lie on your back on a firm surface. Bend your knees and keep your feet flat on the floor. Tense your abdominal muscles. Tip your pelvis up toward the ceiling and flatten your lower back into the floor. To help with this exercise, you may place a small towel under your lower back and try to push your back into the towel. Hold this position for 10 seconds. Let your muscles relax completely before you repeat this exercise. Repeat 10 times. Complete this exercise 1-2 times a day. Alternating arm and leg raises  Get on your hands and knees on a firm surface. If you are on a hard floor, you may want to use padding, such as an exercise mat, to cushion your knees. Line up your arms and legs. Your hands should be directly below your shoulders, and your knees should be directly below your hips. Lift your left leg behind you. At the same time, raise your right arm and  straighten it in front of you. Do not lift your leg higher than your hip. Do not lift your arm higher than your shoulder. Keep your abdominal and back muscles tight. Keep your hips facing the ground. Do not arch your back. Keep your balance carefully, and do not hold your breath. Hold this position for 10 seconds. Slowly return to the starting position. Repeat with your right leg and your left arm. Repeat 10 times. Complete this exercise 1-2 times a day. Posture and body mechanics Good posture and healthy body mechanics can help to relieve stress in your body's tissues and joints. Body mechanics refers to the movements and positions of your body while you do your daily activities. Posture is part of body mechanics. Good posture means: Your spine is in its natural S-curve position (neutral). Your shoulders are pulled back slightly. Your head is not tipped forward. Follow these guidelines to improve your posture and body mechanics in your everyday activities. Standing  When standing, keep your spine neutral and your feet about hip width apart. Keep a slight bend in your knees. Your ears, shoulders, and hips should line up. When you do a task in which you stand in one place for a long time, place one foot up on a stable object that is 2-4 inches (5-10 cm) high, such as a footstool. This helps keep your spine neutral. Sitting  When sitting, keep your spine neutral and keep your feet flat on the floor. Use a footrest, if necessary, and keep your thighs parallel to the floor. Avoid rounding your shoulders, and avoid tilting your head forward. When working at a desk or a computer, keep your desk at a height  where your hands are slightly lower than your elbows. Slide your chair under your desk so you are close enough to maintain good posture. When working at a computer, place your monitor at a height where you are looking straight ahead and you do not have to tilt your head forward or downward to  look at the screen. Resting When lying down and resting, avoid positions that are most painful for you. If you have pain with activities such as sitting, bending, stooping, or squatting, lie in a position in which your body does not bend very much. For example, avoid curling up on your side with your arms and knees near your chest (fetal position). If you have pain with activities such as standing for a long time or reaching with your arms, lie with your spine in a neutral position and bend your knees slightly. Try the following positions: Lying on your side with a pillow between your knees. Lying on your back with a pillow under your knees. Lifting  When lifting objects, keep your feet at least shoulder width apart and tighten your abdominal muscles. Bend your knees and hips and keep your spine neutral. It is important to lift using the strength of your legs, not your back. Do not lock your knees straight out. Always ask for help to lift heavy or awkward objects. This information is not intended to replace advice given to you by your health care provider. Make sure you discuss any questions you have with your health care provider. Document Revised: 09/20/2018 Document Reviewed: 06/20/2018 Elsevier Patient Education  2022 [employer].

## 2022-06-20 NOTE — Progress Notes (Signed)
Return patient Visit  Assessment: [name] is a 62 y.o. female with the following: Left knee pain; mild loss of joint space.  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  I saw her 6-8 weeks ago, at which time she had severe, and acute onset of pain in the left knee.  A steroid injection has provided some relief of her symptoms.  However, she continues to have some discomfort in the medial aspect of the knee.  Radiographs obtained today demonstrates loss of joint space.  She has already initiated some lifestyle improvements, such as seeking help from a nutritionist, which could be helpful for her left knee.  We have discussed the brace, and she was fitted in clinic today.  In addition, appropriate exercises can be effective.  She would like to try home exercises first.  Physical therapy is also an option.  If she is not having much improvement in her symptoms in the next 6 weeks or so, we can consider repeat injection.    Follow-up: Return if symptoms worsen or fail to improve.  Subjective:  Chief Complaint  Patient presents with   Knee Pain    Lt knee pain that has never went away. Pt states there is always a pain in knee and she's trying to get more active.     History of Present Illness: [name] is a 62 y.o. female who returns for evaluation of left knee pain.  I last saw her in clinic approximately 6-8 weeks ago.  At that time, she was having severe pain in the left knee.  She had a steroid injection, which improved her symptoms.  However, she has continued to have some pain in the medial aspect of the knee.  She would like to discuss potential treatment options, in order to further alleviate this pain.  She states that she is working on her general health, and has appropriate medical care.  She is also seeking a nutritionist to help with pain.  She has not worked with physical therapy.  She has not tried a brace.  It is too early for  repeat injection.   Review of Systems: No fevers or chills No numbness or tingling No chest pain No shortness of breath No bowel or bladder dysfunction No GI distress No headaches    Objective: BP 121/83   Pulse 78   Ht 5\' 7"  (1.702 m)   Wt 268 lb (121.6 kg)   BMI 41.97 kg/m   Physical Exam:  General: Alert and oriented. and No acute distress. Gait: Left sided antalgic gait.  Evaluation left knee demonstrates mild effusion.  Tenderness palpation over the medial joint line.  Negative Lachman.  No increased laxity varus valgus stress.  Full range of motion of the left knee.   IMAGING: I personally reviewed images previously obtained from the ED  X-rays of the left knee were obtained in clinic today.  No acute injuries are noted.  Mild varus alignment overall.  Mild to moderate loss of joint space within the medial compartment.  No subchondral sclerosis.  No cysts are appreciated.  Small osteophytes within all 3 compartments.  No bony lesions.  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     Susan Rasmussen, MD  06/20/2022 2:24 PM

## 2022-06-22 ENCOUNTER — Encounter: Payer: 59 | Attending: Internal Medicine | Admitting: Dietician

## 2022-06-22 ENCOUNTER — Encounter: Payer: Self-pay | Admitting: Dietician

## 2022-06-22 VITALS — Ht 66.0 in | Wt 270.0 lb

## 2022-06-22 DIAGNOSIS — Z6841 Body Mass Index (BMI) 40.0 and over, adult: Secondary | ICD-10-CM | POA: Insufficient documentation

## 2022-06-22 NOTE — Progress Notes (Signed)
Medical Nutrition Therapy  Appointment Start time: [phone]   Appointment End time: [phone]  Primary concerns today: weight gain   Referral diagnosis: E66.01 Preferred learning style: no preference indicated Learning readiness: ready   NUTRITION ASSESSMENT   Anthropometrics  (06/22/2022) Ht: 5'6" Wt: 270.0 lb  Clinical Medical Hx: hyperlipidemia, hypertension, myocardial infarction  Medications: Reviewed. Labs: cholesterol 226 (high), triglycerides 205 (high), LDL 133 (high), A1c: 6.2 (05/08/2022) Notable Signs/Symptoms: None reported.  Food Allergies: None reported.  Lifestyle & Dietary Hx  Pt states she lost 100 lbs in 2017-2018 from walking and a healthy diet and gained some back during Covid-19. Pt states she recently had a knee injury that has kept her from being active. Pt states she recently did a meal plan recently, but didn't see the results she wanted. Pt states she is motivated by not being on medication and her new grandchildren.  Pt states she lives in [place] and works in [place]. 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  Pt states she sometimes gets shaky in the morning if she has to many simple carbohydrates. Pt states she takes her mother out to eat 3-4 times a week and usually splits a meal (protein, carbohydrate, and vegetable). Pt states she doesn't drink soda anymore.  Pt states her daughter owns a coffee shop and she usually gets drinks from there on weekends.  Pt states she doesn't like cold foods.   Estimated daily fluid intake: 64 oz Supplements: MVI Sleep: sometimes trouble staying asleep (8 hours/night) Stress / self-care: improved since starting medication (stressful job) Current average weekly physical activity: PT exercise   24-Hr Dietary Recall First Meal: coffee with creamer (3 small pours of creamer) Snack: (10:30 am) [employer] [place] fruit and nut bars  Second Meal: skips or grilled chicken salad or grilled chicken wrap or grilled chicken nuggets and  parfait (eating out) Snack: pretzel chips and laughing cow cheese  Third Meal: eats out or frozen dinners ([employer] steamers) Snack: chocolate kisses  Beverages: coffee with creamer, Gatorade zero, water   NUTRITION DIAGNOSIS  NB-1.1 Food and nutrition-related knowledge deficit As related to no prior education from a nutrition professional.  As evidenced by 24-hour recall and dieting history.   NUTRITION INTERVENTION  Nutrition education (E-1) on the following topics:  Educated pt on the importance of eating consistently throughout the day.  Discussed with pt the difference between complex and simple carbohydrates.  Educated pt on the importance of eating more meals from home if possible. Dicussed with pt difference between minimally processed and processed foods. Educated pt on the health effects of processed foods on our bodies.   Handouts Provided Include  Healthy snacks  Meal ideas  Learning Style & Readiness for Change Teaching method utilized: Visual & Auditory  Demonstrated degree of understanding via: Teach Back  Barriers to learning/adherence to lifestyle change: limited mobility due to knee injury   Goals Established by Pt Decrease the amount of coffee creamer. Make sure to eat 3 meals a day on most days of the week. Continue to do hand weight exercises 3 days a week.    MONITORING & EVALUATION Dietary intake, weekly physical activity, and follow-up in 3 months.  Next Steps  Patient is to call for questions.

## 2022-07-03 ENCOUNTER — Encounter: Payer: Self-pay | Admitting: Internal Medicine

## 2022-07-11 ENCOUNTER — Ambulatory Visit: Payer: 59 | Admitting: Orthopedic Surgery

## 2022-07-21 ENCOUNTER — Encounter: Payer: Self-pay | Admitting: Internal Medicine

## 2022-07-21 ENCOUNTER — Ambulatory Visit: Payer: 59 | Attending: Internal Medicine | Admitting: Internal Medicine

## 2022-07-21 VITALS — BP 142/88 | HR 84 | Ht 67.0 in | Wt 267.0 lb

## 2022-07-21 DIAGNOSIS — I2510 Atherosclerotic heart disease of native coronary artery without angina pectoris: Secondary | ICD-10-CM | POA: Diagnosis not present

## 2022-07-21 MED ORDER — NITROGLYCERIN 0.4 MG SL SUBL: 0.4000 mg | SUBLINGUAL_TABLET | SUBLINGUAL | 3 refills | Status: DC | PRN

## 2022-07-21 NOTE — Progress Notes (Signed)
Cardiology Office Note  Date: 07/21/2022   ID: Anthony, Schaad [birth year], MRN [id number]  PCP:  Johnette Abraham, MD  Cardiologist:  None Electrophysiologist:  None   Reason for Office Visit: Evaluation of CAD the request of Dr. Doren Custard   History of Present Illness: [name] is a 62 y.o. female known to have CAD s/p Cutting Balloon atherectomy of mid LAD in 2004, LCx PCI in 2012 with normal LVEF, HLD, HTN was referred to cardiology clinic to establish care.  Last PCI was in 2012 after which patient was taking aspirin 325 mg every day and no statins until she followed up with Dr. Doren Custard couple of months ago when she was placed on high intensity statin and metoprolol. She gained weight due to [place] pandemic and is trying very hard to lose. She had birth of 2 grandsons recently and wants to be active for them.  Denies any angina, SOB, palpitations, dizziness, leg swelling however she did have episodes of intermittent palpitations associated with dizziness.  She had a smart watch, checked her HR and found to be in 180s. Denies smoking cigarettes.  Past Medical History:  Diagnosis Date   CAD (coronary artery disease), premature-cutting balloon athrectomy LAD 2004, 3 DES-Promus to Prox LCX 2012   04/02/2012   Coronary artery disease    Hyperlipidemia LDL goal < 70 04/02/2012   Hypertension    Metabolic syndrome [id number]   Morbid obesity with BMI of 40.0-44.9, adult ([place]) 04/02/2012   Myocardial infarct ([place])    Pneumonia    hx of in 03/2010   Shortness of breath    Unstable angina ([place]) 04/02/2012    Past Surgical History:  Procedure Laterality Date   ABDOMINAL HYSTERECTOMY     ANKLE SURGERY     CORONARY ANGIOPLASTY     LEFT HEART CATHETERIZATION WITH CORONARY ANGIOGRAM N/A 04/03/2012   Procedure: LEFT HEART CATHETERIZATION WITH CORONARY ANGIOGRAM;  Surgeon: Troy Sine, MD;  Location: [facility] C[facility] LAB;  Service: Cardiovascular;  Laterality: N/A;   LEG SURGERY Right     plates and screws in right lower leg    Current Outpatient Medications  Medication Sig Dispense Refill   acetaminophen (TYLENOL) 650 MG CR tablet Take 650 mg by mouth every 8 (eight) hours as needed for pain or fever.     aspirin 81 MG tablet Take 1 tablet (81 mg total) by mouth daily. 30 tablet 0   atorvastatin (LIPITOR) 40 MG tablet Take 1 tablet (40 mg total) by mouth daily. 90 tablet 3   DULoxetine (CYMBALTA) 30 MG capsule Take 1 capsule (30 mg total) by mouth daily. 30 capsule 2   metoprolol succinate (TOPROL-XL) 25 MG 24 hr tablet Take 1 tablet (25 mg total) by mouth daily. 90 tablet 3   Multiple Vitamins-Minerals (MULTIVITAMIN WITH MINERALS) tablet Take 1 tablet by mouth daily.     No current facility-administered medications for this visit.   Allergies:  Niacin, Niacin and related, Penicillins, and Statins   Social History: The patient  reports that she quit smoking about 32 years ago. Her smoking use included cigarettes. She has never used smokeless tobacco. She reports current alcohol use. She reports that she does not use drugs.   Family History: The patient's family history includes Cancer - Ovarian in her mother; Coronary artery disease in her father and mother; Heart attack in her brother.   ROS:  Please see the history of present illness. Otherwise, complete review of systems is positive  for none.  All other systems are reviewed and negative.   Physical Exam: VS:  BP (!) 142/88   Pulse 84   Ht 5' 7"$  (1.702 m)   Wt 267 lb (121.1 kg)   SpO2 99%   BMI 41.82 kg/m , BMI Body mass index is 41.82 kg/m.  Wt Readings from Last 3 Encounters:  07/21/22 267 lb (121.1 kg)  06/22/22 270 lb (122.5 kg)  06/20/22 268 lb (121.6 kg)    General: Patient appears comfortable at rest. HEENT: Conjunctiva and lids normal, oropharynx clear with moist mucosa. Neck: Supple, no elevated JVP or carotid bruits, no thyromegaly. Lungs: Clear to auscultation, nonlabored breathing at  rest. Cardiac: Regular rate and rhythm, no S3 or significant systolic murmur, no pericardial rub. Abdomen: Soft, nontender, no hepatomegaly, bowel sounds present, no guarding or rebound. Extremities: No pitting edema, distal pulses 2+. Skin: Warm and dry. Musculoskeletal: No kyphosis. Neuropsychiatric: Alert and oriented x3, affect grossly appropriate.  ECG:  An ECG dated 07/21/2022 was personally reviewed today and demonstrated:  Normal sinus rhythm and no ST-T changes.  Recent Labwork: 05/08/2022: ALT 15; AST 14; BUN 15; Creatinine, Ser 0.82; Hemoglobin 14.5; Platelets 269; Potassium 4.8; Sodium 138; TSH 1.120     Component Value Date/Time   CHOL 226 (H) 05/08/2022 0916   TRIG 205 (H) 05/08/2022 0916   HDL 57 05/08/2022 0916   CHOLHDL 4.0 05/08/2022 0916   CHOLHDL 5.5 08/17/2017 0220   VLDL 30 08/17/2017 0220   LDLCALC 133 (H) 05/08/2022 0916    Other Studies Reviewed Today: Echocardiogram from 08/2017 LVEF 60-65% Trivial MR  NM stress test in 08/2017 IMPRESSION: 1. No reversible ischemia or infarction. 2. Normal left ventricular wall motion. 3. Left ventricular ejection fraction 58% 4. Non invasive risk stratification*: Low  Assessment and Plan: Patient is a 62 year old F known to have CAD s/p Cutting Balloon atherectomy of mid LAD in 2004, LCx PCI in 2012 with normal LVEF, HLD, HTN was referred to cardiology clinic to establish care.  # CAD s/p Cutting Balloon atherectomy of mid LAD in 2004, LCx PCI in 2012 with normal LVEF, currently angina free # Family history of premature CAD in mother -Continue Aspir 81 milligram once daily -Continue atorvastatin 40 mg nightly -SL NTG 0.4 mg. -ER precautions for chest pain  # HLD, currently not on goal -Continue atorvastatin 40 mg nightly.  Started taking atorvastatin 2 months ago when she started care with PCP.  Goal LDL less than 70. Will need to repeat lipid panel in 1 month.  # Palpitations likely secondary to possible brief  SVT -Patient had intermittent palpitations associated dizziness. She described the episode as having palpitations with sudden onset and sudden offset lasting for a minute.  She checked her HR on smart watch during these palpitations and found to have elevated HR of 180. Continue metoprolol succinate 25 mg once daily.  # Morbid obesity -Diet and exercise counseling  I have spent a total of 45 minutes with patient reviewing chart, EKGs, labs and examining patient as well as establishing an assessment and plan that was discussed with the patient.  > 50% of time was spent in direct patient care.     Medication Adjustments/Labs and Tests Ordered: Current medicines are reviewed at length with the patient today.  Concerns regarding medicines are outlined above.   Tests Ordered: No orders of the defined types were placed in this encounter.   Medication Changes: No orders of the defined types were placed in this  encounter.   Disposition:  Follow up  one year  Signed, Anthem Frazer Fidel Levy, MD, 07/21/2022 11:37 AM    [place] [facility] [address] [place], [place] [address]

## 2022-07-21 NOTE — Patient Instructions (Signed)
Medication Instructions:  Your physician recommends that you continue on your current medications as directed. Please refer to the Current Medication list given to you today.  Use SL Nitro 0.4 mg tablets for chest pain.  If a single episode of chest pain is not relieved by one tablet, the patient will try another within 5 minutes; and if this doesn't relieve the pain, the patient is instructed to call 911 for transportation to an emergency department. Do not take more than 3 tablets in a 15 minute time span.   Labwork: None  Testing/Procedures: None  Follow-Up: Follow up with Dr. Dellia Cloud in 6 months.   Any Other Special Instructions Will Be Listed Below (If Applicable).     If you need a refill on your cardiac medications before your next appointment, please call your pharmacy.

## 2022-08-10 ENCOUNTER — Encounter: Payer: Self-pay | Admitting: Radiology

## 2022-08-11 ENCOUNTER — Encounter: Payer: Self-pay | Admitting: Internal Medicine

## 2022-08-11 ENCOUNTER — Ambulatory Visit: Payer: 59 | Admitting: Internal Medicine

## 2022-08-11 VITALS — BP 126/86 | HR 95 | Ht 67.0 in | Wt 266.8 lb

## 2022-08-11 DIAGNOSIS — G8929 Other chronic pain: Secondary | ICD-10-CM | POA: Diagnosis not present

## 2022-08-11 DIAGNOSIS — G2581 Restless legs syndrome: Secondary | ICD-10-CM | POA: Diagnosis not present

## 2022-08-11 DIAGNOSIS — I10 Essential (primary) hypertension: Secondary | ICD-10-CM | POA: Diagnosis not present

## 2022-08-11 DIAGNOSIS — G5601 Carpal tunnel syndrome, right upper limb: Secondary | ICD-10-CM | POA: Diagnosis not present

## 2022-08-11 DIAGNOSIS — F329 Major depressive disorder, single episode, unspecified: Secondary | ICD-10-CM

## 2022-08-11 DIAGNOSIS — G5600 Carpal tunnel syndrome, unspecified upper limb: Secondary | ICD-10-CM | POA: Insufficient documentation

## 2022-08-11 DIAGNOSIS — M7918 Myalgia, other site: Secondary | ICD-10-CM

## 2022-08-11 MED ORDER — DULOXETINE HCL 60 MG PO CPEP: 60.0000 mg | ORAL_CAPSULE | Freq: Every day | ORAL | 1 refills | Status: DC

## 2022-08-11 MED ORDER — ROPINIROLE HCL 0.25 MG PO TABS: 0.2500 mg | ORAL_TABLET | Freq: Every day | ORAL | 0 refills | Status: DC

## 2022-08-11 NOTE — Patient Instructions (Signed)
It was a pleasure to see you today.  Thank you for giving [place] the opportunity to be involved in your care.  Below is a brief recap of your visit and next steps.  We will plan to see you again in 3 months.  Summary Increase Cymbalta to 60 mg daily Try wearing a cock up wrist splint on your right wrist Checking iron studies  Requip 0.25 mg nightly prescribed for restless legs We will follow up in 3 months

## 2022-08-11 NOTE — Assessment & Plan Note (Signed)
Mostly located in her left knee and hip.  She states that Cymbalta has been helpful in alleviating her symptoms. -Increase Cymbalta to 60 mg daily

## 2022-08-11 NOTE — Assessment & Plan Note (Signed)
Currently prescribed Cymbalta 30 mg daily.  PHQ-9 score remains elevated today, 17, but has improved from 27 at her last appointment.  She endorses improvement in her mood but agrees that there is room for improvement.  Denies SI/HI. -Increase Cymbalta to 60 mg daily

## 2022-08-11 NOTE — Assessment & Plan Note (Signed)
She endorses bilateral lower extremity paresthesias and restlessness that mostly occurs during long car rides and at night.  This is happening several nights per week. -Iron studies ordered today -Start Requip 0.25 mg nightly

## 2022-08-11 NOTE — Progress Notes (Signed)
Established Patient Office Visit  Subjective   Patient ID: [name], female    DOB: 1961/04/23  Age: 62 y.o. MRN: [id number]  Chief Complaint  Patient presents with   Follow-up    Follow up recently seen cardiology, discuss cymbalta dose, hip pain,numbness in hands   [name] returns to care today for follow-up.  [name] was last seen by me on 06/07/22 at which time Cymbalta 30 mg daily was added for treatment of chronic musculoskeletal pain and depression.  [name] was also started on metoprolol succinate 25 mg daily in the setting of CAD with prior PCI and poorly controlled HTN.  In the interim [name] has been seen by orthopedic surgery for follow-up and has establish care with cardiology.  There have otherwise been no acute interval events.  Today [name] has multiple concerns to discuss.  [name] states that Cymbalta has been helpful in alleviating her chronic musculoskeletal pain and improving her mood.  [name] is interested in increasing the dose.  [name] also endorses numbness/tingling in both of her hands, worse in the right hand.  [name] would like for me to evaluate this today.  [name] lastly endorses restlessness in her legs during long car rides and at night.  [name] is interested in treatment options for this today.  Past Medical History:  Diagnosis Date   CAD (coronary artery disease), premature-cutting balloon athrectomy LAD 2004, 3 DES-Promus to Prox LCX 2012   04/02/2012   Coronary artery disease    Hyperlipidemia LDL goal < 70 04/02/2012   Hypertension    Metabolic syndrome [id number]   Morbid obesity with BMI of 40.0-44.9, adult ([place]) 04/02/2012   Myocardial infarct ([place])    Pneumonia    hx of in 03/2010   Shortness of breath    Unstable angina ([place]) 04/02/2012   Past Surgical History:  Procedure Laterality Date   ABDOMINAL HYSTERECTOMY     ANKLE SURGERY     CORONARY ANGIOPLASTY     LEFT HEART CATHETERIZATION WITH CORONARY ANGIOGRAM N/A 04/03/2012   Procedure: LEFT HEART  CATHETERIZATION WITH CORONARY ANGIOGRAM;  Surgeon: Troy Sine, MD;  Location: [facility] CATH LAB;  Service: Cardiovascular;  Laterality: N/A;   LEG SURGERY Right    plates and screws in right lower leg   Social History   Tobacco Use   Smoking status: Former    Types: Cigarettes    Quit date: 06/12/1990    Years since quitting: 32.1   Smokeless tobacco: Never  Vaping Use   Vaping Use: Never used  Substance Use Topics   Alcohol use: Yes    Comment: occasionally   Drug use: No   Family History  Problem Relation Age of Onset   Coronary artery disease Mother    Cancer - Ovarian Mother    Coronary artery disease Father    Heart attack Brother    Allergies  Allergen Reactions   Niacin Other (See Comments)   Niacin And Related    Penicillins Nausea And Vomiting    Pt reports the vomiting occurred when [name] was 62 y.o. and [name] has taken Penicillin since for dental procedure and tolerated it fine.    Statins    Review of Systems  Musculoskeletal:  Positive for joint pain (left knee, hip).  Neurological:  Positive for tingling (hands (R>L)).       Lower extremity paresthesias  All other systems reviewed and are negative.    Objective:     BP 126/86 (BP Location:  Left Arm)   Pulse 95   Ht '5\' 7"'$  (1.702 m)   Wt 266 lb 12.8 oz (121 kg)   SpO2 94%   BMI 41.79 kg/m  BP Readings from Last 3 Encounters:  08/11/22 126/86  07/21/22 (!) 142/88  06/20/22 121/83   Physical Exam Vitals reviewed.  Constitutional:      General: [name] is not in acute distress.    Appearance: Normal appearance. [name] is obese. [name] is not toxic-appearing.  HENT:     Head: Normocephalic and atraumatic.     Right Ear: External ear normal.     Left Ear: External ear normal.     Nose: Nose normal. No congestion or rhinorrhea.     Mouth/Throat:     Mouth: Mucous membranes are moist.     Pharynx: Oropharynx is clear. No oropharyngeal exudate or posterior oropharyngeal erythema.  Eyes:     General: No scleral  icterus.    Extraocular Movements: Extraocular movements intact.     Conjunctiva/sclera: Conjunctivae normal.     Pupils: Pupils are equal, round, and reactive to light.  Cardiovascular:     Rate and Rhythm: Normal rate and regular rhythm.     Pulses: Normal pulses.     Heart sounds: Normal heart sounds. No murmur heard.    No friction rub. No gallop.  Pulmonary:     Effort: Pulmonary effort is normal.     Breath sounds: Normal breath sounds. No wheezing, rhonchi or rales.  Abdominal:     General: Abdomen is flat. Bowel sounds are normal. There is no distension.     Palpations: Abdomen is soft.     Tenderness: There is no abdominal tenderness.  Musculoskeletal:        General: No swelling. Normal range of motion.     Cervical back: Normal range of motion.     Right lower leg: No edema.     Left lower leg: No edema.  Lymphadenopathy:     Cervical: No cervical adenopathy.  Skin:    General: Skin is warm and dry.     Capillary Refill: Capillary refill takes less than 2 seconds.     Coloration: Skin is not jaundiced.  Neurological:     General: No focal deficit present.     Mental Status: [name] is alert and oriented to person, place, and time.     Comments: Positive Phalen's R wrist  Psychiatric:        Mood and Affect: Mood normal.        Behavior: Behavior normal.   Last CBC Lab Results  Component Value Date   WBC 10.6 05/08/2022   HGB 14.5 05/08/2022   HCT 43.2 05/08/2022   MCV 83 05/08/2022   MCH 27.7 05/08/2022   RDW 13.2 05/08/2022   PLT 269 0000000   Last metabolic panel Lab Results  Component Value Date   GLUCOSE 90 05/08/2022   NA 138 05/08/2022   K 4.8 05/08/2022   CL 100 05/08/2022   CO2 22 05/08/2022   BUN 15 05/08/2022   CREATININE 0.82 05/08/2022   EGFR 81 05/08/2022   CALCIUM 10.2 05/08/2022   PROT 7.5 05/08/2022   ALBUMIN 4.3 05/08/2022   LABGLOB 3.2 05/08/2022   AGRATIO 1.3 05/08/2022   BILITOT 0.2 05/08/2022   ALKPHOS 94 05/08/2022   AST  14 05/08/2022   ALT 15 05/08/2022   ANIONGAP 10 02/24/2022   Last lipids Lab Results  Component Value Date   CHOL 226 (H) 05/08/2022  HDL 57 05/08/2022   LDLCALC 133 (H) 05/08/2022   TRIG 205 (H) 05/08/2022   CHOLHDL 4.0 05/08/2022   Last hemoglobin A1c Lab Results  Component Value Date   HGBA1C 6.2 (H) 05/08/2022   Last thyroid functions Lab Results  Component Value Date   TSH 1.120 05/08/2022   Last vitamin D Lab Results  Component Value Date   VD25OH 34.7 05/08/2022   Last vitamin B12 and Folate Lab Results  Component Value Date   [id number] 05/08/2022   FOLATE 16.6 05/08/2022   The 10-year ASCVD risk score (Arnett DK, et al., 2019) is: 5.6%    Assessment & Plan:   Problem List Items Addressed This Visit       HTN (hypertension)    Currently prescribed metoprolol succinate 25 mg daily.  BP today is 126/86. -No medication changes today      Carpal tunnel syndrome    [name] endorses numbness/tingling in both wrists while driving, typing, and writing.  Her symptoms are worse in the right hand and/or reproducible with Phalen's testing. -I have recommended use of a cock up wrist splint initially.      MDD (major depressive disorder)    Currently prescribed Cymbalta 30 mg daily.  PHQ-9 score remains elevated today, 17, but has improved from 27 at her last appointment.  [name] endorses improvement in her mood but agrees that there is room for improvement.  Denies SI/HI. -Increase Cymbalta to 60 mg daily      Chronic musculoskeletal pain    Mostly located in her left knee and hip.  [name] states that Cymbalta has been helpful in alleviating her symptoms. -Increase Cymbalta to 60 mg daily      Restless leg syndrome    [name] endorses bilateral lower extremity paresthesias and restlessness that mostly occurs during long car rides and at night.  This is happening several nights per week. -Iron studies ordered today -Start Requip 0.25 mg nightly      Return in  about 3 months (around 11/11/2022).   Johnette Abraham, MD

## 2022-08-11 NOTE — Assessment & Plan Note (Signed)
She endorses numbness/tingling in both wrists while driving, typing, and writing.  Her symptoms are worse in the right hand and/or reproducible with Phalen's testing. -I have recommended use of a cock up wrist splint initially.

## 2022-08-11 NOTE — Assessment & Plan Note (Signed)
Currently prescribed metoprolol succinate 25 mg daily.  BP today is 126/86. -No medication changes today

## 2022-09-21 ENCOUNTER — Ambulatory Visit: Payer: 59 | Admitting: Dietician

## 2022-11-17 ENCOUNTER — Ambulatory Visit: Payer: 59 | Admitting: Internal Medicine

## 2022-11-17 ENCOUNTER — Encounter: Payer: Self-pay | Admitting: Internal Medicine

## 2022-11-17 VITALS — BP 133/84 | HR 95 | Ht 67.0 in | Wt 267.8 lb

## 2022-11-17 DIAGNOSIS — G2581 Restless legs syndrome: Secondary | ICD-10-CM

## 2022-11-17 DIAGNOSIS — F330 Major depressive disorder, recurrent, mild: Secondary | ICD-10-CM | POA: Diagnosis not present

## 2022-11-17 DIAGNOSIS — I2510 Atherosclerotic heart disease of native coronary artery without angina pectoris: Secondary | ICD-10-CM | POA: Diagnosis not present

## 2022-11-17 DIAGNOSIS — I10 Essential (primary) hypertension: Secondary | ICD-10-CM | POA: Diagnosis not present

## 2022-11-17 DIAGNOSIS — Z6841 Body Mass Index (BMI) 40.0 and over, adult: Secondary | ICD-10-CM | POA: Diagnosis not present

## 2022-11-17 MED ORDER — CARIPRAZINE HCL 1.5 MG PO CAPS: 1.5000 mg | ORAL_CAPSULE | Freq: Every day | ORAL | 2 refills | Status: DC

## 2022-11-17 NOTE — Patient Instructions (Signed)
It was a pleasure to see you today.  Thank you for giving [place] the opportunity to be involved in your care.  Below is a brief recap of your visit and next steps.  We will plan to see you again in 4-6 weeks.  Summary Add Vraylar for improved treatment of depression Repeat labs. We will plan to start [facility] pending lab results Follow up through video in 4-6 weeks

## 2022-11-17 NOTE — Progress Notes (Signed)
Established Patient Office Visit  Subjective   Patient ID: [name], female    DOB: 1960-12-24  Age: 62 y.o. MRN: [id number]  Chief Complaint  Patient presents with   RLS    Follow up   [name] returns to care today for routine follow-up.  She was last evaluated by me on 3/1.  At that time Cymbalta was increased to 60 mg daily for improved treatment of depression.  Ropinirole was also added for treatment of restless leg syndrome.  65-month follow-up was arranged.  There have been no acute interval events.  [name] reports feeling fairly well today but has multiple concerns to discuss.  Her acute concern is persistent stress and anxiety.  Cymbalta has recently been increased to 60 mg daily.  She states that this has been mildly effective, but she feels that there is still room for improvement.  She is interested in additional medication options.  Her other concern is weight loss.  She is aware that she is morbidly obese and that this is negatively impacting other chronic medical conditions.  She would like to start a medication for weight loss.  Past Medical History:  Diagnosis Date   CAD (coronary artery disease), premature-cutting balloon athrectomy LAD 2004, 3 DES-Promus to Prox LCX 2012   04/02/2012   Coronary artery disease    Hyperlipidemia LDL goal < 70 04/02/2012   Hypertension    Metabolic syndrome 04/03/2012   Morbid obesity with BMI of 40.0-44.9, adult (HCC) 04/02/2012   Myocardial infarct (HCC)    Pneumonia    hx of in 03/2010   Shortness of breath    Unstable angina (HCC) 04/02/2012   Past Surgical History:  Procedure Laterality Date   ABDOMINAL HYSTERECTOMY     ANKLE SURGERY     CORONARY ANGIOPLASTY     LEFT HEART CATHETERIZATION WITH CORONARY ANGIOGRAM N/A 04/03/2012   Procedure: LEFT HEART CATHETERIZATION WITH CORONARY ANGIOGRAM;  Surgeon: Lennette Bihari, MD;  Location: [facility] CATH LAB;  Service: Cardiovascular;  Laterality: N/A;   LEG SURGERY Right     plates and screws in right lower leg   Social History   Tobacco Use   Smoking status: Former    Types: Cigarettes    Quit date: 06/12/1990    Years since quitting: 32.4   Smokeless tobacco: Never  Vaping Use   Vaping Use: Never used  Substance Use Topics   Alcohol use: Yes    Comment: occasionally   Drug use: No   Family History  Problem Relation Age of Onset   Coronary artery disease Mother    Cancer - Ovarian Mother    Coronary artery disease Father    Heart attack Brother    Allergies  Allergen Reactions   Niacin Other (See Comments)   Niacin And Related    Penicillins Nausea And Vomiting    Pt reports the vomiting occurred when she was 62 y.o. and she has taken Penicillin since for dental procedure and tolerated it fine.    Statins    Review of Systems  Psychiatric/Behavioral:  The patient is nervous/anxious.      Objective:     BP 133/84   Pulse 95   Ht 5\' 7"  (1.702 m)   Wt 267 lb 12.8 oz (121.5 kg)   SpO2 90%   BMI 41.94 kg/m  BP Readings from Last 3 Encounters:  11/17/22 133/84  08/11/22 126/86  07/21/22 (!) 142/88   Physical Exam Vitals reviewed.  Constitutional:  General: She is not in acute distress.    Appearance: Normal appearance. She is obese. She is not toxic-appearing.  HENT:     Head: Normocephalic and atraumatic.     Right Ear: External ear normal.     Left Ear: External ear normal.     Nose: Nose normal. No congestion or rhinorrhea.     Mouth/Throat:     Mouth: Mucous membranes are moist.     Pharynx: Oropharynx is clear. No oropharyngeal exudate or posterior oropharyngeal erythema.  Eyes:     General: No scleral icterus.    Extraocular Movements: Extraocular movements intact.     Conjunctiva/sclera: Conjunctivae normal.     Pupils: Pupils are equal, round, and reactive to light.  Cardiovascular:     Rate and Rhythm: Normal rate and regular rhythm.     Pulses: Normal pulses.     Heart sounds: Normal heart sounds. No murmur  heard.    No friction rub. No gallop.  Pulmonary:     Effort: Pulmonary effort is normal.     Breath sounds: Normal breath sounds. No wheezing, rhonchi or rales.  Abdominal:     General: Abdomen is flat. Bowel sounds are normal. There is no distension.     Palpations: Abdomen is soft.     Tenderness: There is no abdominal tenderness.  Musculoskeletal:        General: No swelling. Normal range of motion.     Cervical back: Normal range of motion.     Right lower leg: No edema.     Left lower leg: No edema.  Lymphadenopathy:     Cervical: No cervical adenopathy.  Skin:    General: Skin is warm and dry.     Capillary Refill: Capillary refill takes less than 2 seconds.     Coloration: Skin is not jaundiced.  Neurological:     General: No focal deficit present.     Mental Status: She is alert and oriented to person, place, and time.  Psychiatric:        Mood and Affect: Mood normal.        Behavior: Behavior normal.   Last CBC Lab Results  Component Value Date   WBC 9.9 11/17/2022   HGB 13.5 11/17/2022   HCT 40.7 11/17/2022   MCV 82 11/17/2022   MCH 27.2 11/17/2022   RDW 13.7 11/17/2022   PLT 257 11/17/2022   Last metabolic panel Lab Results  Component Value Date   GLUCOSE 100 (H) 11/17/2022   NA 138 11/17/2022   K 4.8 11/17/2022   CL 102 11/17/2022   CO2 23 11/17/2022   BUN 11 11/17/2022   CREATININE 0.76 11/17/2022   EGFR 88 11/17/2022   CALCIUM 10.4 (H) 11/17/2022   PROT 7.4 11/17/2022   ALBUMIN 4.3 11/17/2022   LABGLOB 3.1 11/17/2022   AGRATIO 1.4 11/17/2022   BILITOT 0.3 11/17/2022   ALKPHOS 123 (H) 11/17/2022   AST 20 11/17/2022   ALT 16 11/17/2022   ANIONGAP 10 02/24/2022   Last lipids Lab Results  Component Value Date   CHOL 151 11/17/2022   HDL 48 11/17/2022   LDLCALC 77 11/17/2022   TRIG 151 (H) 11/17/2022   CHOLHDL 3.1 11/17/2022   Last hemoglobin A1c Lab Results  Component Value Date   HGBA1C 6.4 (H) 11/17/2022   Last thyroid  functions Lab Results  Component Value Date   TSH 1.430 11/17/2022   Last vitamin D Lab Results  Component Value Date   VD25OH  34.5 11/17/2022   Last vitamin B12 and Folate Lab Results  Component Value Date   VITAMINB12 529 11/17/2022   FOLATE 18.1 11/17/2022   The 10-year ASCVD risk score (Arnett DK, et al., 2019) is: 5.9%    Assessment & Plan:   Problem List Items Addressed This Visit       Morbid obesity with BMI of 40.0-44.9, adult (HCC) (Chronic)    BMI 41.9.  She has attempted to make lifestyle modifications aimed at weight loss without significant results.  She is interested in starting a medication for weight loss today.  We discussed Wegovy and Zepbound.  She is interested in either option. -Repeat labs ordered today.  Pending results, will prescribe Wegovy. -Lifestyle modifications aimed at weight loss were reviewed again today.       MDD (major depressive disorder)    Cymbalta was increased to 60 mg daily at her last appointment as her PHQ-9 score remained elevated.  She feels as though this has been modestly effective, noting that she no longer has crying episodes like she used to.  She still feels as though there is room for improvement.  PHQ-9 score remains elevated today at 15. -Through shared decision making, Leafy Kindle has been added today -We will tentatively plan for reassessment through video encounter in 4-6 weeks.      Restless leg syndrome    Symptoms have improved with the addition of ropinirole.  She will complete iron studies today.      Return in about 4 weeks (around 12/15/2022) for MDD.   Billie Lade, MD

## 2022-11-18 LAB — CMP14+EGFR
ALT: 16 IU/L (ref 0–32)
AST: 20 IU/L (ref 0–40)
Albumin/Globulin Ratio: 1.4 (ref 1.2–2.2)
Albumin: 4.3 g/dL (ref 3.9–4.9)
Alkaline Phosphatase: 123 IU/L — ABNORMAL HIGH (ref 44–121)
BUN/Creatinine Ratio: 14 (ref 12–28)
BUN: 11 mg/dL (ref 8–27)
Bilirubin Total: 0.3 mg/dL (ref 0.0–1.2)
CO2: 23 mmol/L (ref 20–29)
Calcium: 10.4 mg/dL — ABNORMAL HIGH (ref 8.7–10.3)
Chloride: 102 mmol/L (ref 96–106)
Creatinine, Ser: 0.76 mg/dL (ref 0.57–1.00)
Globulin, Total: 3.1 g/dL (ref 1.5–4.5)
Glucose: 100 mg/dL — ABNORMAL HIGH (ref 70–99)
Potassium: 4.8 mmol/L (ref 3.5–5.2)
Sodium: 138 mmol/L (ref 134–144)
Total Protein: 7.4 g/dL (ref 6.0–8.5)
eGFR: 88 mL/min/{1.73_m2} (ref >59)

## 2022-11-18 LAB — IRON,TIBC AND FERRITIN PANEL
Ferritin: 161 ng/mL — ABNORMAL HIGH (ref 15–150)
Iron Saturation: 21 % (ref 15–55)
Iron: 63 ug/dL (ref 27–139)
Total Iron Binding Capacity: 294 ug/dL (ref 250–450)
UIBC: 231 ug/dL (ref 118–369)

## 2022-11-18 LAB — B12 AND FOLATE PANEL
Folate: 18.1 ng/mL (ref >3.0)
Vitamin B-12: 529 pg/mL (ref 232–1245)

## 2022-11-18 LAB — LIPID PANEL
Chol/HDL Ratio: 3.1 ratio (ref 0.0–4.4)
Cholesterol, Total: 151 mg/dL (ref 100–199)
HDL: 48 mg/dL (ref >39)
LDL Chol Calc (NIH): 77 mg/dL (ref 0–99)
Triglycerides: 151 mg/dL — ABNORMAL HIGH (ref 0–149)
VLDL Cholesterol Cal: 26 mg/dL (ref 5–40)

## 2022-11-18 LAB — CBC WITH DIFFERENTIAL/PLATELET
Basophils Absolute: 0.1 10*3/uL (ref 0.0–0.2)
Basos: 1 %
EOS (ABSOLUTE): 0.2 10*3/uL (ref 0.0–0.4)
Eos: 2 %
Hematocrit: 40.7 % (ref 34.0–46.6)
Hemoglobin: 13.5 g/dL (ref 11.1–15.9)
Immature Grans (Abs): 0 10*3/uL (ref 0.0–0.1)
Immature Granulocytes: 0 %
Lymphocytes Absolute: 3.2 10*3/uL — ABNORMAL HIGH (ref 0.7–3.1)
Lymphs: 32 %
MCH: 27.2 pg (ref 26.6–33.0)
MCHC: 33.2 g/dL (ref 31.5–35.7)
MCV: 82 fL (ref 79–97)
Monocytes Absolute: 1 10*3/uL — ABNORMAL HIGH (ref 0.1–0.9)
Monocytes: 10 %
Neutrophils Absolute: 5.5 10*3/uL (ref 1.4–7.0)
Neutrophils: 55 %
Platelets: 257 10*3/uL (ref 150–450)
RBC: 4.96 x10E6/uL (ref 3.77–5.28)
RDW: 13.7 % (ref 11.7–15.4)
WBC: 9.9 10*3/uL (ref 3.4–10.8)

## 2022-11-18 LAB — HEMOGLOBIN A1C
Est. average glucose Bld gHb Est-mCnc: 137 mg/dL
Hgb A1c MFr Bld: 6.4 % — ABNORMAL HIGH (ref 4.8–5.6)

## 2022-11-18 LAB — TSH+FREE T4
Free T4: 1.07 ng/dL (ref 0.82–1.77)
TSH: 1.43 u[IU]/mL (ref 0.450–4.500)

## 2022-11-18 LAB — VITAMIN D 25 HYDROXY (VIT D DEFICIENCY, FRACTURES): Vit D, 25-Hydroxy: 34.5 ng/mL (ref 30.0–100.0)

## 2022-11-19 ENCOUNTER — Other Ambulatory Visit: Payer: Self-pay | Admitting: Internal Medicine

## 2022-11-19 DIAGNOSIS — R7303 Prediabetes: Secondary | ICD-10-CM

## 2022-11-19 MED ORDER — SEMAGLUTIDE-WEIGHT MANAGEMENT 0.25 MG/0.5ML ~~LOC~~ SOAJ
0.2500 mg | SUBCUTANEOUS | 0 refills | Status: DC
Start: 2022-11-19 — End: 2022-12-15

## 2022-11-21 ENCOUNTER — Encounter: Payer: Self-pay | Admitting: Internal Medicine

## 2022-11-23 NOTE — Assessment & Plan Note (Deleted)
BMI 41.9.  She has attempted to make lifestyle modifications aimed at weight loss without significant results.  She is interested in starting a medication for weight loss today.  We discussed Wegovy and Zepbound.  She is interested in either option. -Repeat labs ordered today.  Pending results, will prescribe Wegovy. -Lifestyle modifications aimed at weight loss were reviewed again today.

## 2022-11-23 NOTE — Assessment & Plan Note (Signed)
BMI 41.9.  She has attempted to make lifestyle modifications aimed at weight loss without significant results.  She is interested in starting a medication for weight loss today.  We discussed Wegovy and Zepbound.  She is interested in either option. -Repeat labs ordered today.  Pending results, will prescribe Wegovy. -Lifestyle modifications aimed at weight loss were reviewed again today.

## 2022-11-23 NOTE — Assessment & Plan Note (Signed)
Symptoms have improved with the addition of ropinirole.  She will complete iron studies today.

## 2022-11-23 NOTE — Assessment & Plan Note (Signed)
Cymbalta was increased to 60 mg daily at her last appointment as her PHQ-9 score remained elevated.  She feels as though this has been modestly effective, noting that she no longer has crying episodes like she used to.  She still feels as though there is room for improvement.  PHQ-9 score remains elevated today at 15. -Through shared decision making, Leafy Kindle has been added today -We will tentatively plan for reassessment through video encounter in 4-6 weeks.

## 2022-12-15 ENCOUNTER — Encounter: Payer: Self-pay | Admitting: Internal Medicine

## 2022-12-15 ENCOUNTER — Telehealth: Payer: 59 | Admitting: Internal Medicine

## 2022-12-15 DIAGNOSIS — F330 Major depressive disorder, recurrent, mild: Secondary | ICD-10-CM | POA: Diagnosis not present

## 2022-12-15 NOTE — Assessment & Plan Note (Signed)
Evaluated today through virtual encounter for follow-up of MDD.  Vraylar was started at her last appointment.  She is additionally prescribed Cymbalta 60 mg daily.  As noted above, she endorses significant stress and anxiety currently related to her daughter's pregnancy.  Overall she feels as though her mood has been stable and her symptoms have not worsened. -Through shared decision making, no medication changes have been made today.  We will continue Cymbalta and Vraylar as currently prescribed and tentatively plan for follow-up in 3 months.

## 2022-12-15 NOTE — Progress Notes (Signed)
Virtual Visit via Video Note  I connected with [name] on 12/15/22 at 11:00 AM EDT by a video enabled telemedicine application and verified that I am speaking with the correct person using two identifiers.  Patient Location: Home Provider Location: Office/Clinic  I discussed the limitations, risks, security, and privacy concerns of performing an evaluation and management service by video and the availability of in person appointments. I also discussed with the patient that there may be a patient responsible charge related to this service. The patient expressed understanding and agreed to proceed.  Subjective: PCP: Susan Lade, MD  Chief Complaint  Patient presents with   MDD    Follow up   Weight Loss    Was not able to get wegovy due to ins not covering.   [name] has been evaluated today through virtual encounter for follow-up of MDD.  She was last seen by me on 6/7 at which time [name] was started.  In the interim, [name] was also prescribed for weight loss.  Unfortunately, this was not covered by her insurance due to a plan exclusion.  Today [name] endorses significant stress and anxiety as her daughter is currently in labor.  She is at a hospital preparing to undergo a C-section.  The last week has been very stressful as there have been multiple complications surrounding her pregnancy.  This has caused [name] to experience significant stress and anxiety.  She is unsure if [name] has been beneficial or not, but states that it has not made her symptoms any worse.  She endorses left hip and bilateral wrist pain today.  She plans to contact orthopedic surgery to schedule follow-up.   ROS: Per HPI  Current Outpatient Medications:    acetaminophen (TYLENOL) 650 MG CR tablet, Take 650 mg by mouth every 8 (eight) hours as needed for pain or fever., Disp: , Rfl:    aspirin 81 MG tablet, Take 1 tablet (81 mg total) by mouth daily., Disp: 30 tablet, Rfl: 0    atorvastatin (LIPITOR) 40 MG tablet, Take 1 tablet (40 mg total) by mouth daily., Disp: 90 tablet, Rfl: 3   cariprazine (VRAYLAR) 1.5 MG capsule, Take 1 capsule (1.5 mg total) by mouth daily., Disp: 30 capsule, Rfl: 2   DULoxetine (CYMBALTA) 60 MG capsule, Take 1 capsule (60 mg total) by mouth daily., Disp: 90 capsule, Rfl: 1   metoprolol succinate (TOPROL-XL) 25 MG 24 hr tablet, Take 1 tablet (25 mg total) by mouth daily., Disp: 90 tablet, Rfl: 3   Multiple Vitamins-Minerals (MULTIVITAMIN WITH MINERALS) tablet, Take 1 tablet by mouth daily., Disp: , Rfl:    nitroGLYCERIN (NITROSTAT) 0.4 MG SL tablet, Place 1 tablet (0.4 mg total) under the tongue every 5 (five) minutes as needed for chest pain. If a single episode of chest pain is not relieved by one tablet, the patient will try another within 5 minutes; and if this doesn't relieve the pain, the patient is instructed to call 911 for transportation to an emergency department., Disp: 25 tablet, Rfl: 3   rOPINIRole (REQUIP) 0.25 MG tablet, Take 1 tablet (0.25 mg total) by mouth at bedtime., Disp: 30 tablet, Rfl: 0  Assessment and Plan:  Mild episode of recurrent major depressive disorder [facility]) Assessment & Plan: Evaluated today through virtual encounter for follow-up of MDD.  Vraylar was started at her last appointment.  She is additionally prescribed Cymbalta 60 mg daily.  As noted above, she endorses significant stress and anxiety currently  related to her daughter's pregnancy.  Overall she feels as though her mood has been stable and her symptoms have not worsened. -Through shared decision making, no medication changes have been made today.  We will continue Cymbalta and Vraylar as currently prescribed and tentatively plan for follow-up in 3 months.  Follow Up Instructions: Return in about 3 months (around 03/17/2023).   I discussed the assessment and treatment plan with the patient. The patient was provided an opportunity to ask questions, and all  were answered. The patient agreed with the plan and demonstrated an understanding of the instructions.   The patient was advised to call back or seek an in-person evaluation if the symptoms worsen or if the condition fails to improve as anticipated.  The above assessment and management plan was discussed with the patient. The patient verbalized understanding of and has agreed to the management plan.   Susan Lade, MD

## 2022-12-18 ENCOUNTER — Telehealth: Payer: Self-pay | Admitting: Internal Medicine

## 2022-12-18 NOTE — Telephone Encounter (Signed)
Returned pt call  

## 2022-12-18 NOTE — Telephone Encounter (Signed)
Patient retuning nurse call

## 2022-12-26 ENCOUNTER — Encounter: Payer: Self-pay | Admitting: Orthopedic Surgery

## 2022-12-26 ENCOUNTER — Other Ambulatory Visit (INDEPENDENT_AMBULATORY_CARE_PROVIDER_SITE_OTHER): Payer: 59

## 2022-12-26 ENCOUNTER — Ambulatory Visit: Payer: 59 | Admitting: Orthopedic Surgery

## 2022-12-26 VITALS — BP 143/81 | HR 74 | Ht 67.0 in | Wt 268.0 lb

## 2022-12-26 DIAGNOSIS — M25552 Pain in left hip: Secondary | ICD-10-CM

## 2022-12-26 DIAGNOSIS — G8929 Other chronic pain: Secondary | ICD-10-CM | POA: Diagnosis not present

## 2022-12-26 DIAGNOSIS — M545 Low back pain, unspecified: Secondary | ICD-10-CM

## 2022-12-26 NOTE — Progress Notes (Signed)
Return patient Visit  Assessment: [name] is a 62 y.o. female with the following: 1. Pain in left hip  Plan: [name] has pain, primarily in the lower back, and into the left buttock.  Some pain in the left groin.  Radiographs of the lumbar spine demonstrated a chronic appearing compression fracture, and x-rays of the left hip are without acute injury or degenerative changes.  On physical exam, she has excellent strength in bilateral lower extremities.  The pain in the buttock and lower back with manipulation of the left hip.  Pain over the ASIS with [facility] testing.  At this point, I believe most of her symptoms are coming from her back.  She will continue with her medications as tolerated.  I am recommending physical therapy.  I would like to see her back in 2 months.  We can consider a steroid injection in the left hip, if she continues to have more pain in the left groin.  We briefly discussed treatment of bilateral carpal tunnel syndrome.  Her description of her pain is consistent.  If she continues to have issues, we will likely obtain EMGs.  Follow-up: Return in about 2 months (around 02/26/2023).  Subjective:  Chief Complaint  Patient presents with   Hip Pain    L hip pain w/ hx of compression fx seen in Chest CT.     History of Present Illness: [name] is a 62 y.o. female who returns for evaluation of left hip pain.  She has pain in the left lower back, midline lower back, as well as the left buttock.  Occasional pain in the left groin.  She reports a fall approximately 2 months ago.  She has been treated for bilateral knee injuries.  Injections have been helpful.  She feels as though the symptoms could be progressively worsening due to her gait.  She is unable to take NSAIDs as she is taking aspirin.  Tylenol has not been effective.  She denies radiating pains into her legs.  No numbness or tingling.  She was told that she had a compression fracture that was  noted on CT scan of her chest.   Review of Systems: No fevers or chills No numbness or tingling No chest pain No shortness of breath No bowel or bladder dysfunction No GI distress No headaches   Medical History:  Past Medical History:  Diagnosis Date   CAD (coronary artery disease), premature-cutting balloon athrectomy LAD 2004, 3 DES-Promus to Prox LCX 2012   04/02/2012   Coronary artery disease    Hyperlipidemia LDL goal < 70 04/02/2012   Hypertension    Metabolic syndrome 04/03/2012   Morbid obesity with BMI of 40.0-44.9, adult (HCC) 04/02/2012   Myocardial infarct (HCC)    Pneumonia    hx of in 03/2010   Shortness of breath    Unstable angina (HCC) 04/02/2012    Past Surgical History:  Procedure Laterality Date   ABDOMINAL HYSTERECTOMY     ANKLE SURGERY     CORONARY ANGIOPLASTY     LEFT HEART CATHETERIZATION WITH CORONARY ANGIOGRAM N/A 04/03/2012   Procedure: LEFT HEART CATHETERIZATION WITH CORONARY ANGIOGRAM;  Surgeon: Lennette Bihari, MD;  Location: [facility] CATH LAB;  Service: Cardiovascular;  Laterality: N/A;   LEG SURGERY Right    plates and screws in right lower leg    Family History  Problem Relation Age of Onset   Coronary artery disease Mother    Cancer - Ovarian Mother  Coronary artery disease Father    Heart attack Brother    Social History   Tobacco Use   Smoking status: Former    Current packs/day: 0.00    Types: Cigarettes    Quit date: 06/12/1990    Years since quitting: 32.5   Smokeless tobacco: Never  Vaping Use   Vaping status: Never Used  Substance Use Topics   Alcohol use: Yes    Comment: occasionally   Drug use: No    Allergies  Allergen Reactions   Niacin Other (See Comments)   Niacin And Related    Penicillins Nausea And Vomiting    Pt reports the vomiting occurred when she was 62 y.o. and she has taken Penicillin since for dental procedure and tolerated it fine.    Statins     No outpatient medications have been marked as  taking for the 12/26/22 encounter (Office Visit) with Oliver Barre, MD.    Objective: BP (!) 143/81   Pulse 74   Ht 5\' 7"  (1.702 m)   Wt 268 lb (121.6 kg)   BMI 41.97 kg/m   Physical Exam:  General: Alert and oriented. and No acute distress. Gait: Slow, steady gait.  Tenderness in the mid lower back.  Negative straight leg raise bilaterally.  Sensation is intact throughout bilateral lower extremities.  5/5 strength in bilateral lower extremities.  2+ patellar tendon reflexes.  Pain in the anterior hip with [facility] testing.  Tenderness over the ASIS.  She tolerates internal and external rotation of the left hip.  IMAGING: I personally ordered and reviewed the following images  Standing lumbar spine x-rays were obtained in clinic today.  No acute injuries are noted.  No anterolisthesis.  Mild degenerative changes overall.  Chronic appearing L1 compression fracture.  Impression: Lumbar spine x-rays without acute injury, chronic L1 compression fracture   X-rays of the left hip were obtained in clinic today.  No acute injuries are noted.  Well-maintained joint space within the left hip.  No bony lesions.  Impression: Negative left hip x-ray   New Medications:  No orders of the defined types were placed in this encounter.     Oliver Barre, MD  12/26/2022 5:42 PM

## 2022-12-26 NOTE — Patient Instructions (Signed)
Please provide a note for work; limited walking at work until completion of physical therapy

## 2023-01-15 ENCOUNTER — Encounter: Payer: Self-pay | Admitting: Internal Medicine

## 2023-01-26 ENCOUNTER — Ambulatory Visit: Payer: 59 | Admitting: Internal Medicine

## 2023-01-26 ENCOUNTER — Encounter: Payer: Self-pay | Admitting: Internal Medicine

## 2023-01-26 VITALS — BP 140/96 | HR 81 | Ht 67.0 in | Wt 273.0 lb

## 2023-01-26 DIAGNOSIS — R2689 Other abnormalities of gait and mobility: Secondary | ICD-10-CM | POA: Insufficient documentation

## 2023-01-26 DIAGNOSIS — I10 Essential (primary) hypertension: Secondary | ICD-10-CM

## 2023-01-26 DIAGNOSIS — Z79899 Other long term (current) drug therapy: Secondary | ICD-10-CM | POA: Diagnosis not present

## 2023-01-26 DIAGNOSIS — M25552 Pain in left hip: Secondary | ICD-10-CM | POA: Insufficient documentation

## 2023-01-26 DIAGNOSIS — G8929 Other chronic pain: Secondary | ICD-10-CM | POA: Insufficient documentation

## 2023-01-26 DIAGNOSIS — M545 Low back pain, unspecified: Secondary | ICD-10-CM | POA: Insufficient documentation

## 2023-01-26 DIAGNOSIS — M5459 Other low back pain: Secondary | ICD-10-CM | POA: Insufficient documentation

## 2023-01-26 DIAGNOSIS — L820 Inflamed seborrheic keratosis: Secondary | ICD-10-CM | POA: Diagnosis not present

## 2023-01-26 DIAGNOSIS — R29898 Other symptoms and signs involving the musculoskeletal system: Secondary | ICD-10-CM | POA: Insufficient documentation

## 2023-01-26 NOTE — Patient Instructions (Signed)
Medication Instructions:  Your physician recommends that you continue on your current medications as directed. Please refer to the Current Medication list given to you today.  *If you need a refill on your cardiac medications before your next appointment, please call your pharmacy*   Lab Work: None If you have labs (blood work) drawn today and your tests are completely normal, you will receive your results only by: MyChart Message (if you have MyChart) OR A paper copy in the mail If you have any lab test that is abnormal or we need to change your treatment, we will call you to review the results.   Testing/Procedures: None   Follow-Up: At [facility], you and your health needs are our priority.  As part of our continuing mission to provide you with exceptional heart care, we have created designated Provider Care Teams.  These Care Teams include your primary Cardiologist (physician) and Advanced Practice Providers (APPs -  Physician Assistants and Nurse Practitioners) who all work together to provide you with the care you need, when you need it.  We recommend signing up for the patient portal called "MyChart".  Sign up information is provided on this After Visit Summary.  MyChart is used to connect with patients for Virtual Visits (Telemedicine).  Patients are able to view lab/test results, encounter notes, upcoming appointments, etc.  Non-urgent messages can be sent to your provider as well.   To learn more about what you can do with MyChart, go to [email or web address].    Your next appointment:   1 year(s)  Provider:   You may see None or one of the following Advanced Practice Providers on your designated Care Team:   [place], PA-C  Jacolyn Reedy, PA-C     Other Instructions .You have been referred to Pharm D ([facility] in [place]). They will call you with your first appointment.

## 2023-01-26 NOTE — Progress Notes (Signed)
Cardiology Office Note  Date: 01/26/2023   ID: [name], DOB Mar 18, 1961, MRN [id number]  PCP:  Billie Lade, MD  Cardiologist:  None Electrophysiologist:  None    History of Present Illness: [name] is a 62 y.o. female known to have CAD s/p Cutting Balloon atherectomy of mid LAD in 2004, LCx PCI in 2012 with normal LVEF, HLD, HTN is here for follow-up visit.  Overall doing great, no angina, DOE, orthopnea, PND, leg swelling, dizziness, presyncope and syncope.  Her PCP prescribed Wegovy in 6/24 but she had to pay $1300 out of her pocket every month and hence opted out of it.  Past Medical History:  Diagnosis Date   CAD (coronary artery disease), premature-cutting balloon athrectomy LAD 2004, 3 DES-Promus to Prox LCX 2012   04/02/2012   Coronary artery disease    Hyperlipidemia LDL goal < 70 04/02/2012   Hypertension    Metabolic syndrome 04/03/2012   Morbid obesity with BMI of 40.0-44.9, adult (HCC) 04/02/2012   Myocardial infarct (HCC)    Pneumonia    hx of in 03/2010   Shortness of breath    Unstable angina (HCC) 04/02/2012    Past Surgical History:  Procedure Laterality Date   ABDOMINAL HYSTERECTOMY     ANKLE SURGERY     CORONARY ANGIOPLASTY     LEFT HEART CATHETERIZATION WITH CORONARY ANGIOGRAM N/A 04/03/2012   Procedure: LEFT HEART CATHETERIZATION WITH CORONARY ANGIOGRAM;  Surgeon: Lennette Bihari, MD;  Location: [facility] CATH LAB;  Service: Cardiovascular;  Laterality: N/A;   LEG SURGERY Right    plates and screws in right lower leg    Current Outpatient Medications  Medication Sig Dispense Refill   acetaminophen (TYLENOL) 650 MG CR tablet Take 650 mg by mouth every 8 (eight) hours as needed for pain or fever.     aspirin 81 MG tablet Take 1 tablet (81 mg total) by mouth daily. 30 tablet 0   atorvastatin (LIPITOR) 40 MG tablet Take 1 tablet (40 mg total) by mouth daily. 90 tablet 3   DULoxetine (CYMBALTA) 60 MG capsule Take 1 capsule (60 mg total) by  mouth daily. 90 capsule 1   metoprolol succinate (TOPROL-XL) 25 MG 24 hr tablet Take 1 tablet (25 mg total) by mouth daily. 90 tablet 3   Multiple Vitamins-Minerals (MULTIVITAMIN WITH MINERALS) tablet Take 1 tablet by mouth daily.     nitroGLYCERIN (NITROSTAT) 0.4 MG SL tablet Place 1 tablet (0.4 mg total) under the tongue every 5 (five) minutes as needed for chest pain. If a single episode of chest pain is not relieved by one tablet, the patient will try another within 5 minutes; and if this doesn't relieve the pain, the patient is instructed to call 911 for transportation to an emergency department. 25 tablet 3   rOPINIRole (REQUIP) 0.25 MG tablet Take 1 tablet (0.25 mg total) by mouth at bedtime. 30 tablet 0   No current facility-administered medications for this visit.   Allergies:  Niacin, Niacin and related, Penicillins, and Statins   Social History: The patient  reports that she quit smoking about 32 years ago. Her smoking use included cigarettes. She has never used smokeless tobacco. She reports current alcohol use. She reports that she does not use drugs.   Family History: The patient's family history includes Cancer - Ovarian in her mother; Coronary artery disease in her father and mother; Heart attack in her brother.   ROS:  Please see the history of present  illness. Otherwise, complete review of systems is positive for none.  All other systems are reviewed and negative.   Physical Exam: VS:  Ht 5\' 7"  (1.702 m)   Wt 273 lb (123.8 kg)   BMI 42.76 kg/m , BMI Body mass index is 42.76 kg/m.  Wt Readings from Last 3 Encounters:  01/26/23 273 lb (123.8 kg)  12/26/22 268 lb (121.6 kg)  11/17/22 267 lb 12.8 oz (121.5 kg)    General: Patient appears comfortable at rest. HEENT: Conjunctiva and lids normal, oropharynx clear with moist mucosa. Neck: Supple, no elevated JVP or carotid bruits, no thyromegaly. Lungs: Clear to auscultation, nonlabored breathing at rest. Cardiac: Regular  rate and rhythm, no S3 or significant systolic murmur, no pericardial rub. Abdomen: Soft, nontender, no hepatomegaly, bowel sounds present, no guarding or rebound. Extremities: No pitting edema, distal pulses 2+. Skin: Warm and dry. Musculoskeletal: No kyphosis. Neuropsychiatric: Alert and oriented x3, affect grossly appropriate.  ECG:  An ECG dated 07/21/2022 was personally reviewed today and demonstrated:  Normal sinus rhythm and no ST-T changes.  Recent Labwork: 11/17/2022: ALT 16; AST 20; BUN 11; Creatinine, Ser 0.76; Hemoglobin 13.5; Platelets 257; Potassium 4.8; Sodium 138; TSH 1.430     Component Value Date/Time   CHOL 151 11/17/2022 1111   TRIG 151 (H) 11/17/2022 1111   HDL 48 11/17/2022 1111   CHOLHDL 3.1 11/17/2022 1111   CHOLHDL 5.5 08/17/2017 0220   VLDL 30 08/17/2017 0220   LDLCALC 77 11/17/2022 1111    Other Studies Reviewed Today: Echocardiogram from 08/2017 LVEF 60-65% Trivial MR  NM stress test in 08/2017 IMPRESSION: 1. No reversible ischemia or infarction. 2. Normal left ventricular wall motion. 3. Left ventricular ejection fraction 58% 4. Non invasive risk stratification*: Low  Assessment and Plan: Patient is a 62 year old F known to have CAD s/p Cutting Balloon atherectomy of mid LAD in 2004, LCx PCI in 2012 with normal LVEF, HLD, HTN is here for follow-up visit.  CAD s/p mid LAD PCI in 2004, LCx PCI in 2012 with normal LVEF, angina free: No interval angina, continue aspirin 81 mg once daily, atorvastatin 40 mg nightly, SL NTG 0.4 mg as needed for chest pains.  ER precautions for chest pain.  Echo from 2019 showed normal LVEF, no valvular heart disease.  Pharm.D. referral to initiate Wegovy due to CAD and morbid obesity.  HLD, at goal: Continue atorvastatin 40 mg nightly. Goal LDL less than 70.  Lipid panel from 2 months ago was reviewed, TG 151 (mildly elevated), LDL 77.  Palpitations, resolved: Continue metoprolol succinate 25 mg once daily.  Morbid obesity  with BMI 42.76: Due to morbid obesity and CAD, she will benefit from initiation of Wegovy.  Pharm.D. referral for the same.    Medication Adjustments/Labs and Tests Ordered: Current medicines are reviewed at length with the patient today.  Concerns regarding medicines are outlined above.   Tests Ordered: Orders Placed This Encounter  Procedures   EKG 12-Lead    Medication Changes: No orders of the defined types were placed in this encounter.   Disposition:  Follow up  one year  Signed, Derin Granquist Verne Spurr, MD, 01/26/2023 12:58 PM    [facility] at [facility] [address], [place], [place] [address]

## 2023-01-30 ENCOUNTER — Other Ambulatory Visit: Payer: Self-pay

## 2023-01-30 ENCOUNTER — Ambulatory Visit (HOSPITAL_COMMUNITY): Payer: 59 | Attending: Internal Medicine | Admitting: Physical Therapy

## 2023-01-30 ENCOUNTER — Encounter (HOSPITAL_COMMUNITY): Payer: Self-pay | Admitting: Physical Therapy

## 2023-01-30 DIAGNOSIS — G8929 Other chronic pain: Secondary | ICD-10-CM | POA: Diagnosis not present

## 2023-01-30 DIAGNOSIS — R2689 Other abnormalities of gait and mobility: Secondary | ICD-10-CM

## 2023-01-30 DIAGNOSIS — R29898 Other symptoms and signs involving the musculoskeletal system: Secondary | ICD-10-CM

## 2023-01-30 DIAGNOSIS — M5459 Other low back pain: Secondary | ICD-10-CM

## 2023-01-30 DIAGNOSIS — Z79899 Other long term (current) drug therapy: Secondary | ICD-10-CM | POA: Diagnosis not present

## 2023-01-30 DIAGNOSIS — I10 Essential (primary) hypertension: Secondary | ICD-10-CM | POA: Diagnosis not present

## 2023-01-30 DIAGNOSIS — M25552 Pain in left hip: Secondary | ICD-10-CM | POA: Diagnosis not present

## 2023-01-30 DIAGNOSIS — M545 Low back pain, unspecified: Secondary | ICD-10-CM | POA: Diagnosis not present

## 2023-01-30 NOTE — Therapy (Signed)
OUTPATIENT PHYSICAL THERAPY THORACOLUMBAR EVALUATION   Patient Name: KEYLEE VANNORMAN MRN: [id number] DOB:[birth year], 62 y.o., female Today's Date: 01/30/2023  END OF SESSION:  PT End of Session - 01/30/23 1120     Visit Number 1    Number of Visits 16   1-2x/week for 8 weeks   Date for PT Re-Evaluation 03/27/23    Authorization Type [employer], (NO auth, No VL)    PT Start Time 1120    PT Stop Time 1211    PT Time Calculation (min) 51 min    Activity Tolerance Patient tolerated treatment well    Behavior During Therapy [facility] for tasks assessed/performed             Past Medical History:  Diagnosis Date   CAD (coronary artery disease), premature-cutting balloon athrectomy LAD 2004, 3 DES-Promus to Prox LCX 2012   04/02/2012   Coronary artery disease    Hyperlipidemia LDL goal < 70 04/02/2012   Hypertension    Metabolic syndrome 04/03/2012   Morbid obesity with BMI of 40.0-44.9, adult (HCC) 04/02/2012   Myocardial infarct (HCC)    Pneumonia    hx of in 03/2010   Shortness of breath    Unstable angina (HCC) 04/02/2012   Past Surgical History:  Procedure Laterality Date   ABDOMINAL HYSTERECTOMY     ANKLE SURGERY     CORONARY ANGIOPLASTY     LEFT HEART CATHETERIZATION WITH CORONARY ANGIOGRAM N/A 04/03/2012   Procedure: LEFT HEART CATHETERIZATION WITH CORONARY ANGIOGRAM;  Surgeon: Lennette Bihari, MD;  Location: [facility] CATH LAB;  Service: Cardiovascular;  Laterality: N/A;   LEG SURGERY Right    plates and screws in right lower leg   Patient Active Problem List   Diagnosis Date Noted   MDD (major depressive disorder) 08/11/2022   Chronic musculoskeletal pain 08/11/2022   Restless leg syndrome 08/11/2022   Carpal tunnel syndrome 08/11/2022   Positive screening for depression on 9-item Patient Health Questionnaire (PHQ-9) 06/13/2022   H/O: hysterectomy 05/08/2022   Encounter for general adult medical examination with abnormal findings 05/08/2022   Chest pain 08/16/2017    History of MI (myocardial infarction)    Class 3 severe obesity due to excess calories with body mass index (BMI) of 40.0 to 44.9 in adult [facility])    Metabolic syndrome 04/03/2012   HTN (hypertension) 04/03/2012   Family history of coronary artery disease 04/03/2012   Unstable angina (HCC) 04/02/2012   CAD (coronary artery disease), premature;  cutting balloon athrectomy LAD 2004, 3 DES-Promus to Prox LCX 2012   04/02/2012   Morbid obesity with BMI of 40.0-44.9, adult (HCC) 04/02/2012   Hyperlipidemia 04/02/2012   PAINFUL HARDWARE 04/18/2007    PCP: Christel Mormon MD  REFERRING PROVIDER: Oliver Barre, MD  REFERRING DIAG: M54.50,G89.29 (ICD-10-CM) - Chronic left-sided low back pain without sciatica  Rationale for Evaluation and Treatment: Rehabilitation  THERAPY DIAG:  Other low back pain  Pain in left hip  Other abnormalities of gait and mobility  Other symptoms and signs involving the musculoskeletal system  ONSET DATE: 2022  SUBJECTIVE:  SUBJECTIVE STATEMENT: Larey Seat down steps in December 2022 and she thinks that is when the fracture occurred. Had xray but nothing showed up at that time. Then January 2023, she tore MCL on L, meniscus on R. Pain in L hip in the front wrapping from back. She enjoys walking but difficulty due to the pain. Standing is worse than walking. Feels better leaning on grocery cart, feels better slouched than upright. Has been doing some exercises given to her by family. Uses lumbar support in car which helps. Can walk about .25 miles before symptoms begin with walking from back radiating to front.   PERTINENT HISTORY:  Fall with chronic L1 compression fracture, January 2023: she tore MCL on L, tore meniscus on R  PAIN:  Are you having pain? Yes: NPRS scale: 3-4/10 Pain  location: back/hip Pain description: pain Aggravating factors: standing/walking Relieving factors: exercises  PRECAUTIONS: None  WEIGHT BEARING RESTRICTIONS: No  FALLS:  Has patient fallen in last 6 months? No   PLOF: Independent  PATIENT GOALS: pain relief, better mobility   OBJECTIVE: (objective measures from initial evaluation unless otherwise dated)  Observation: Hands behind LE with increased sitting time  DIAGNOSTIC FINDINGS: 12/26/22 Narrative & Impression Standing lumbar spine x-rays were obtained in clinic today.  No acute injuries are noted.  No anterolisthesis.  Mild degenerative changes overall.  Chronic appearing L1 compression fracture. Impression: Lumbar spine x-rays without acute injury, chronic L1 compression fracture  PATIENT SURVEYS:  FOTO 42% function  SCREENING FOR RED FLAGS: Bowel or bladder incontinence: No Spinal tumors: No Cauda equina syndrome: No Compression fracture: No Abdominal aneurysm: No  COGNITION: Overall cognitive status: Within functional limits for tasks assessed     SENSATION: WFL   POSTURE: rounded shoulders, forward head, decreased lumbar lordosis, and increased thoracic kyphosis  PALPATION: Grossly TTP lower bilateral paraspinals, bilateral glutes max, med/min, bilateral R rectus femoris proximally L>R  LUMBAR ROM:   AROM eval  Flexion 0% limited  Extension 25% limited *  Right lateral flexion 25% limited *  Left lateral flexion 25% limited *  Right rotation 0% limited  Left rotation 0% limited   (Blank rows = not tested) * = pain/symptoms  LOWER EXTREMITY ROM:     Active  Right eval Left eval  Hip flexion    Hip extension    Hip abduction    Hip adduction    Hip internal rotation    Hip external rotation    Knee flexion    Knee extension    Ankle dorsiflexion    Ankle plantarflexion    Ankle inversion    Ankle eversion     (Blank rows = not tested) * = pain/symptoms  LOWER EXTREMITY MMT:    MMT  Right eval Left eval  Hip flexion 5 4*  Hip extension 4+ 4+  Hip abduction 5 5  Hip adduction    Hip internal rotation    Hip external rotation    Knee flexion 5 5  Knee extension 5 5  Ankle dorsiflexion 5 5  Ankle plantarflexion    Ankle inversion    Ankle eversion     (Blank rows = not tested) * = pain/symptoms    FUNCTIONAL TESTS:  2 minute walk test:    GAIT: Distance walked: 100 feet Assistive device utilized: None Level of assistance: Complete Independence Comments:   TODAY'S TREATMENT:  DATE:  01/30/23  Eval and HEP   PATIENT EDUCATION:  Education details: Patient educated on exam findings, POC, scope of PT, HEP, and continue exercises completed at home, hip vs lumbar and possible multi component contribution. Person educated: Patient Education method: Explanation, Demonstration, and Handouts Education comprehension: verbalized understanding, returned demonstration, verbal cues required, and tactile cues required  HOME EXERCISE PROGRAM: Access Code: [email or web address] URL: [email or web address]  Date: 01/30/2023 - Prone Hip Extension  - 2 x daily - 7 x weekly - 2 sets - 10 reps  ASSESSMENT:  CLINICAL IMPRESSION: Patient a 62 y.o. y.o. female who was seen today for physical therapy evaluation and treatment for LBP and L hip pain. Patient presents with pain limited deficits in lumbar spine and L hip strength, ROM, endurance, activity tolerance, and functional mobility with ADL. Patient is having to modify and restrict ADL as indicated by outcome measure score as well as subjective information and objective measures which is affecting overall participation. Patient will benefit from skilled physical therapy in order to improve function and reduce impairment.  OBJECTIVE IMPAIRMENTS: Abnormal gait, decreased endurance, decreased mobility,  difficulty walking, decreased ROM, decreased strength, increased muscle spasms, impaired flexibility, improper body mechanics, postural dysfunction, and pain.   ACTIVITY LIMITATIONS: carrying, lifting, bending, standing, squatting, stairs, transfers, locomotion level, and caring for others  PARTICIPATION LIMITATIONS: meal prep, cleaning, laundry, shopping, community activity, occupation, and yard work  PERSONAL FACTORS: Fitness, Time since onset of injury/illness/exacerbation, and 3+ comorbidities: chronic LBP, hx bilateral knee injuries, hx MI, CAD, HTN  are also affecting patient's functional outcome.   REHAB POTENTIAL: Good  CLINICAL DECISION MAKING: Stable/uncomplicated  EVALUATION COMPLEXITY: Low   GOALS: Goals reviewed with patient? Yes  SHORT TERM GOALS: Target date: 02/27/2023    Patient will be independent with HEP in order to improve functional outcomes. Baseline:  Goal status: INITIAL  2.  Patient will report at least 25% improvement in symptoms for improved quality of life. Baseline:  Goal status: INITIAL    LONG TERM GOALS: Target date: 03/27/2023    Patient will report at least 75% improvement in symptoms for improved quality of life. Baseline:  Goal status: INITIAL  2.  Patient will improve FOTO score by at least 10 points in order to indicate improved tolerance to activity. Baseline: 47% function Goal status: INITIAL  3.  Patient will demonstrate at least 25% improvement in lumbar ROM in all restricted planes for improved ability to move trunk while completing chores. Baseline: see above Goal status: INITIAL  4. Patient will demonstrate grade of 5/5 MMT grade in all tested musculature as evidence of improved strength to assist with stair ambulation and gait. Baseline:  see above Goal status: INITIAL  5.  Patient will be able to return to all activities unrestricted for improved ability to perform work functions, exercise and participate with family.   Baseline: restricted with exercise due to walking, pain with standing/walking Goal status: INITIAL    PLAN:  PT FREQUENCY: 1-2x/week  PT DURATION: 8 weeks  PLANNED INTERVENTIONS: Therapeutic exercises, Therapeutic activity, Neuromuscular re-education, Balance training, Gait training, Patient/Family education, Joint manipulation, Joint mobilization, Stair training, Orthotic/Fit training, DME instructions, Aquatic Therapy, Dry Needling, Electrical stimulation, Spinal manipulation, Spinal mobilization, Cryotherapy, Moist heat, Compression bandaging, scar mobilization, Splintting, Taping, Traction, Ultrasound, Ionotophoresis 4mg /ml Dexamethasone, and Manual therapy  PLAN FOR NEXT SESSION: f/u with HEP, possibly begin hip flexor stretches, glute and core strength, possibly trial lumbar extension/press up, functional strength    Wyman Songster, PT  01/30/2023, 12:16 PM

## 2023-02-01 ENCOUNTER — Other Ambulatory Visit (HOSPITAL_COMMUNITY): Payer: Self-pay

## 2023-02-01 ENCOUNTER — Ambulatory Visit: Payer: 59 | Attending: Cardiovascular Disease | Admitting: Pharmacist

## 2023-02-01 ENCOUNTER — Telehealth: Payer: Self-pay

## 2023-02-01 ENCOUNTER — Telehealth: Payer: Self-pay | Admitting: Pharmacist

## 2023-02-01 ENCOUNTER — Encounter: Payer: Self-pay | Admitting: Pharmacist

## 2023-02-01 VITALS — Ht 67.0 in | Wt 268.8 lb

## 2023-02-01 DIAGNOSIS — Z6841 Body Mass Index (BMI) 40.0 and over, adult: Secondary | ICD-10-CM | POA: Diagnosis not present

## 2023-02-01 NOTE — Telephone Encounter (Signed)
Request recently denied. Attempting to resubmit. PA request has been Submitted. New Encounter created for follow up. For additional info see Pharmacy Prior Auth telephone encounter from 02/01/23.

## 2023-02-01 NOTE — Telephone Encounter (Signed)
Pharmacy Patient Advocate Encounter   Received notification from Physician's Office that prior authorization for [facility] is required/requested.   Insurance verification completed.   The patient is insured through [employer] [facility] .   Per test claim: PA required; PA submitted to [employer] [facility] via CoverMyMeds Key/confirmation #/EOC [id number] Status is pending

## 2023-02-01 NOTE — Patient Instructions (Addendum)
It was nice meeting you this morning  The medications we discussed today are called Wegpvy and Zepbound  I will complete the prior authorizations and contact you when I receive a response form your [employer]et[employer] plan  Please let me know if you have any questions  Laural Golden, PharmD, BCACP, CDCES, CPP [address], [address] [place], [place], [address] Phone: [phone], Fax: [phone]

## 2023-02-01 NOTE — Progress Notes (Signed)
Patient ID: [name]                 DOB: 09-14-1960                    MRN: [id number]     HPI: [name] is a 62 y.o. female patient referred to pharmacy clinic by Dr Jenene Slicker to initiate GLP1-RA therapy. PMH is significant for CAD, angina, HTN, MDD, and obesity. Most recent BMI 42.75.  A1c 6.4. Patient recently prescribed [facility] by PCP. Pharmacy quoted her 1300 dollars. Does not know if a PA was submitted.  Patient reports weigh gain since Covid and now has 2 grandchildren she would like to be able to run around with.  Labs: Lab Results  Component Value Date   HGBA1C 6.4 (H) 11/17/2022    Wt Readings from Last 1 Encounters:  01/26/23 273 lb (123.8 kg)    BP Readings from Last 1 Encounters:  01/26/23 (!) 140/96   Pulse Readings from Last 1 Encounters:  01/26/23 81       Component Value Date/Time   CHOL 151 11/17/2022 1111   TRIG 151 (H) 11/17/2022 1111   HDL 48 11/17/2022 1111   CHOLHDL 3.1 11/17/2022 1111   CHOLHDL 5.5 08/17/2017 0220   VLDL 30 08/17/2017 0220   LDLCALC 77 11/17/2022 1111    Past Medical History:  Diagnosis Date   CAD (coronary artery disease), premature-cutting balloon athrectomy LAD 2004, 3 DES-Promus to Prox LCX 2012   04/02/2012   Coronary artery disease    Hyperlipidemia LDL goal < 70 04/02/2012   Hypertension    Metabolic syndrome 04/03/2012   Morbid obesity with BMI of 40.0-44.9, adult (HCC) 04/02/2012   Myocardial infarct (HCC)    Pneumonia    hx of in 03/2010   Shortness of breath    Unstable angina (HCC) 04/02/2012    Current Outpatient Medications on File Prior to Visit  Medication Sig Dispense Refill   acetaminophen (TYLENOL) 650 MG CR tablet Take 650 mg by mouth every 8 (eight) hours as needed for pain or fever.     aspirin 81 MG tablet Take 1 tablet (81 mg total) by mouth daily. 30 tablet 0   atorvastatin (LIPITOR) 40 MG tablet Take 1 tablet (40 mg total) by mouth daily. 90 tablet 3   DULoxetine (CYMBALTA) 60  MG capsule Take 1 capsule (60 mg total) by mouth daily. 90 capsule 1   metoprolol succinate (TOPROL-XL) 25 MG 24 hr tablet Take 1 tablet (25 mg total) by mouth daily. 90 tablet 3   Multiple Vitamins-Minerals (MULTIVITAMIN WITH MINERALS) tablet Take 1 tablet by mouth daily.     nitroGLYCERIN (NITROSTAT) 0.4 MG SL tablet Place 1 tablet (0.4 mg total) under the tongue every 5 (five) minutes as needed for chest pain. If a single episode of chest pain is not relieved by one tablet, the patient will try another within 5 minutes; and if this doesn't relieve the pain, the patient is instructed to call 911 for transportation to an emergency department. 25 tablet 3   rOPINIRole (REQUIP) 0.25 MG tablet Take 1 tablet (0.25 mg total) by mouth at bedtime. 30 tablet 0   No current facility-administered medications on file prior to visit.    Allergies  Allergen Reactions   Niacin Other (See Comments)   Niacin And Related    Penicillins Nausea And Vomiting    Pt reports the vomiting occurred when she was 62 y.o. and she  has taken Penicillin since for dental procedure and tolerated it fine.    Statins      Assessment/Plan:  1. Weight loss - Patient BMI today 42.09 which places patient in severe obesity category, Will submit PA for [facility] and consider Zepbound if Reginal Lutes is denied.  Using demo pens, educated patient on mechanism of action, storage, site selection, administration and possible adverse effects, Confirmed patient not pregnant and no personal or family history of medullary thyroid carcinoma (MTC) or Multiple Endocrine Neoplasia syndrome type 2 (MEN 2). Injection technique reviewed at today's visit.  Advised patient on common side effects including nausea, diarrhea, dyspepsia, decreased appetite, and fatigue. Counseled patient on reducing meal size and how to titrate medication to minimize side effects. Counseled patient to call if intolerable side effects or if experiencing dehydration, abdominal  pain, or dizziness. Patient will adhere to dietary modifications and will target at least 150 minutes of moderate intensity exercise weekly.   Laural Golden, PharmD, BCACP, CDCES, CPP [address] [place], [place], [address] Phone: [phone], Fax: [phone]

## 2023-02-02 NOTE — Telephone Encounter (Signed)
Pharmacy Patient Advocate Encounter  Received notification from [facility] that Prior Authorization for [facility] has been DENIED. Please advise how you'd like to proceed. Full denial letter will be uploaded to the media tab. See denial reason below.  PLAN EXCLUSION

## 2023-02-07 ENCOUNTER — Encounter: Payer: Self-pay | Admitting: Pharmacist

## 2023-02-23 ENCOUNTER — Encounter (HOSPITAL_COMMUNITY): Payer: 59

## 2023-02-27 ENCOUNTER — Ambulatory Visit: Payer: 59 | Admitting: Orthopedic Surgery

## 2023-03-01 ENCOUNTER — Emergency Department (HOSPITAL_COMMUNITY)
Admission: EM | Admit: 2023-03-01 | Discharge: 2023-03-01 | Disposition: A | Discharge status: Home / Self Care | Payer: 59 | Attending: Student | Admitting: Student

## 2023-03-01 ENCOUNTER — Other Ambulatory Visit: Payer: Self-pay

## 2023-03-01 ENCOUNTER — Emergency Department (HOSPITAL_COMMUNITY): Payer: 59

## 2023-03-01 ENCOUNTER — Encounter (HOSPITAL_COMMUNITY): Payer: Self-pay

## 2023-03-01 DIAGNOSIS — R09A2 Foreign body sensation, throat: Secondary | ICD-10-CM | POA: Diagnosis not present

## 2023-03-01 DIAGNOSIS — R0989 Other specified symptoms and signs involving the circulatory and respiratory systems: Secondary | ICD-10-CM | POA: Diagnosis not present

## 2023-03-01 DIAGNOSIS — Z0389 Encounter for observation for other suspected diseases and conditions ruled out: Secondary | ICD-10-CM | POA: Diagnosis not present

## 2023-03-01 DIAGNOSIS — Z7982 Long term (current) use of aspirin: Secondary | ICD-10-CM | POA: Diagnosis not present

## 2023-03-01 DIAGNOSIS — M47819 Spondylosis without myelopathy or radiculopathy, site unspecified: Secondary | ICD-10-CM | POA: Diagnosis not present

## 2023-03-01 DIAGNOSIS — I2510 Atherosclerotic heart disease of native coronary artery without angina pectoris: Secondary | ICD-10-CM | POA: Insufficient documentation

## 2023-03-01 NOTE — ED Triage Notes (Signed)
Pt c/o throat pain from swallowing a tooth crown while eating lunch happened yesterday. Pt states she tried to vomit it up and she tried to eat food to get it down, but is unable to. Pt c/o SOB and unable to swallow. Pt is eupneic and able to maintain secretions.

## 2023-03-01 NOTE — ED Provider Notes (Signed)
[place] EMERGENCY DEPARTMENT AT [facility] Provider Note   CSN: [id number] Arrival date & time: 03/01/23  0935     History  No chief complaint on file.   [name] is a 62 y.o. female with past medical history of CAD status post Cutting Balloon arthrectomy of mid LAD (2004), HLD presents to the emergency department following swallowing a dental crown at 1330 yesterday.  She reports that she was at [employer] salmon when she swallowed her crown.  She has tried to drink warm water to remove sensation of obstruction down.  This morning at 0800, she reports that she was eating a cheese [place] in small bites when she was unable to swallow it causing her to feel anxious and cough [place] back up. Currently, she reports she can feel the sensation of something in her throat and difficulty swallowing but denies shortness of breath.  She was seen at urgent care today who reported they did not have the necessary imaging for soft tissue imaging.  HPI     Home Medications Prior to Admission medications   Medication Sig Start Date End Date Taking? Authorizing Provider  acetaminophen (TYLENOL) 650 MG CR tablet Take 650 mg by mouth every 8 (eight) hours as needed for pain or fever.    [provider]  aspirin 81 MG tablet Take 1 tablet (81 mg total) by mouth daily. 08/17/17   Shirley, [place], DO  atorvastatin (LIPITOR) 40 MG tablet Take 1 tablet (40 mg total) by mouth daily. 05/08/22   Billie Lade, MD  DULoxetine (CYMBALTA) 60 MG capsule Take 1 capsule (60 mg total) by mouth daily. 08/11/22 02/07/23  Billie Lade, MD  metoprolol succinate (TOPROL-XL) 25 MG 24 hr tablet Take 1 tablet (25 mg total) by mouth daily. 06/07/22   Billie Lade, MD  Multiple Vitamins-Minerals (MULTIVITAMIN WITH MINERALS) tablet Take 1 tablet by mouth daily.    [provider]  nitroGLYCERIN (NITROSTAT) 0.4 MG SL tablet Place 1 tablet (0.4 mg total) under the tongue every 5  (five) minutes as needed for chest pain. If a single episode of chest pain is not relieved by one tablet, the patient will try another within 5 minutes; and if this doesn't relieve the pain, the patient is instructed to call 911 for transportation to an emergency department. 07/21/22 01/26/23  Mallipeddi, Vishnu P, MD  rOPINIRole (REQUIP) 0.25 MG tablet Take 1 tablet (0.25 mg total) by mouth at bedtime. 08/11/22 01/26/23  Billie Lade, MD      Allergies    Niacin, Niacin and related, Penicillins, and Statins    Review of Systems   Review of Systems  HENT:  Positive for trouble swallowing. Negative for drooling.   Respiratory:  Negative for shortness of breath.   Cardiovascular:  Negative for chest pain.    Physical Exam Updated Vital Signs BP (!) 145/80   Pulse 78   Temp (!) 97.5 F (36.4 C) (Oral)   Resp 16   Ht 5\' 7"  (1.702 m)   Wt 121.9 kg   SpO2 99%   BMI 42.09 kg/m  Physical Exam Vitals and nursing note reviewed.  Constitutional:      General: She is not in acute distress.    Appearance: Normal appearance. She is not diaphoretic.     Comments: Able to maintain secretions  HENT:     Head: Normocephalic and atraumatic.     Mouth/Throat:     Comments: Abscence of first  molar Eyes:     Conjunctiva/sclera: Conjunctivae normal.  Cardiovascular:     Rate and Rhythm: Normal rate.  Pulmonary:     Effort: Pulmonary effort is normal. No respiratory distress.     Breath sounds: Normal breath sounds. No stridor. No wheezing.     Comments: Able to speak in full and complete sentences without difficulty Skin:    Coloration: Skin is not jaundiced or pale.  Neurological:     Mental Status: She is alert. Mental status is at baseline.     ED Results / Procedures / Treatments   Labs (all labs ordered are listed, but only abnormal results are displayed) Labs Reviewed - No data to display  EKG None  Radiology DG Neck Soft Tissue  Result Date: 03/01/2023 CLINICAL DATA:   Swallowed a crown. EXAM: NECK SOFT TISSUES - 2 VIEW COMPARISON:  None Available. FINDINGS: No definite radiopaque, metallic foreign body identified in the visualized neck. No soft tissue thickening. Preserved epiglottis. No soft tissue gas. Moderate degenerative changes of the spine with loss of normal cervical lordosis. IMPRESSION: No definite metallic radiopaque foreign body identified. Additional workup as clinically appropriate. Electronically Signed   By: Karen Kays M.D.   On: 03/01/2023 14:26   DG Chest 1 View  Result Date: 03/01/2023 CLINICAL DATA:  Swallowed dental crown EXAM: CHEST  1 VIEW COMPARISON:  Chest radiograph dated 02/24/2022 FINDINGS: Normal lung volumes. No focal consolidations. No pleural effusion or pneumothorax. The heart size and mediastinal contours are within normal limits. No acute osseous abnormality. No radiopaque foreign body. IMPRESSION: 1. No radiopaque foreign body. 2.  No focal consolidations. Electronically Signed   By: Agustin Cree M.D.   On: 03/01/2023 13:21    Procedures Procedures    Medications Ordered in ED Medications - No data to display  ED Course/ Medical Decision Making/ A&P                                 Medical Decision Making Amount and/or Complexity of Data Reviewed Radiology: ordered.   Upon evaluation, patient is resting comfortably in bed.  She does not appear in physical signs of distress or respiratory distress.  She is able to speak in full and complete sentences.  She is able to maintain her secretions and is not drooling.  She complains of sensation of "something" in throat.    Upon exam, absence of the first right molar with no surrounding erythema, swelling is appreciated. due to focused complaint, do not feel that lab work is necessary for treatment of disposition plan.  Lung sounds CTAB.  I have reviewed X-ray chest and neck which were both negative for foreign body.  Discussed that "sensation" could be due to soft tissue trauma  from swallowing crown and this foreign body is not noted on x-ray.  Discussed additional imaging of abdomen to see if foreign body in abdomen with patient.  Educated patient that crown with likely be removed from body via defecation and visualizing it on x-ray would not change treatment or disposition plan.  Patient expresses understanding and refuses abdomen x-ray at this time.  She denies abdominal pain, cessation of flatulence or bowel movement.  Patient is stable for discharge.  Throughout ED visit, she denies shortness of breath, difficulty swallowing. discussed return precautions to include but not limited to significant abdominal pain, rectal bleeding.  Patient is to follow-up with dentist regarding management of teeth.  Patient expresses understanding and agrees with treatment plan.         Final Clinical Impression(s) / ED Diagnoses Final diagnoses:  Foreign body sensation in throat    Rx / DC Orders ED Discharge Orders     None         Judithann Sheen, PA 03/01/23 1446    Glendora Score, MD 03/01/23 [phone]

## 2023-03-01 NOTE — Discharge Instructions (Addendum)
Thank you for letting us take care of you today.  Your imaging studies were negative for foreign body in neck or chest.  Please follow-up with dentist regarding management of teeth.  Return to emergency department if experiencing significant abdominal pain, cessation of flatulence or lack of bowel movements in 24 to 48 hours, significant rectal bleeding, fever

## 2023-03-02 ENCOUNTER — Encounter (HOSPITAL_COMMUNITY): Payer: 59

## 2023-03-09 ENCOUNTER — Encounter (HOSPITAL_COMMUNITY): Payer: 59

## 2023-03-12 ENCOUNTER — Other Ambulatory Visit: Payer: Self-pay | Admitting: Internal Medicine

## 2023-03-12 DIAGNOSIS — G2581 Restless legs syndrome: Secondary | ICD-10-CM

## 2023-03-12 DIAGNOSIS — M7918 Myalgia, other site: Secondary | ICD-10-CM

## 2023-03-12 DIAGNOSIS — F329 Major depressive disorder, single episode, unspecified: Secondary | ICD-10-CM

## 2023-03-13 ENCOUNTER — Ambulatory Visit: Payer: 59

## 2023-03-13 ENCOUNTER — Ambulatory Visit
Admission: EM | Admit: 2023-03-13 | Discharge: 2023-03-13 | Disposition: A | Discharge status: Home / Self Care | Payer: 59 | Attending: Nurse Practitioner | Admitting: Nurse Practitioner

## 2023-03-13 DIAGNOSIS — S8012XA Contusion of left lower leg, initial encounter: Secondary | ICD-10-CM

## 2023-03-13 DIAGNOSIS — S99912A Unspecified injury of left ankle, initial encounter: Secondary | ICD-10-CM | POA: Diagnosis not present

## 2023-03-13 DIAGNOSIS — M2012 Hallux valgus (acquired), left foot: Secondary | ICD-10-CM | POA: Diagnosis not present

## 2023-03-13 DIAGNOSIS — I700 Atherosclerosis of aorta: Secondary | ICD-10-CM | POA: Diagnosis not present

## 2023-03-13 DIAGNOSIS — M79672 Pain in left foot: Secondary | ICD-10-CM | POA: Diagnosis not present

## 2023-03-13 DIAGNOSIS — M940 Chondrocostal junction syndrome [Tietze]: Secondary | ICD-10-CM

## 2023-03-13 DIAGNOSIS — W19XXXA Unspecified fall, initial encounter: Secondary | ICD-10-CM

## 2023-03-13 DIAGNOSIS — S91302A Unspecified open wound, left foot, initial encounter: Secondary | ICD-10-CM

## 2023-03-13 DIAGNOSIS — Z043 Encounter for examination and observation following other accident: Secondary | ICD-10-CM | POA: Diagnosis not present

## 2023-03-13 DIAGNOSIS — R9389 Abnormal findings on diagnostic imaging of other specified body structures: Secondary | ICD-10-CM | POA: Diagnosis not present

## 2023-03-13 DIAGNOSIS — M19072 Primary osteoarthritis, left ankle and foot: Secondary | ICD-10-CM | POA: Diagnosis not present

## 2023-03-13 DIAGNOSIS — R0781 Pleurodynia: Secondary | ICD-10-CM | POA: Diagnosis not present

## 2023-03-13 DIAGNOSIS — M25572 Pain in left ankle and joints of left foot: Secondary | ICD-10-CM | POA: Diagnosis not present

## 2023-03-13 DIAGNOSIS — M79662 Pain in left lower leg: Secondary | ICD-10-CM | POA: Diagnosis not present

## 2023-03-13 MED ORDER — ACETAMINOPHEN 325 MG PO TABS
975.0000 mg | ORAL_TABLET | Freq: Once | ORAL | Status: AC
  Administered 2023-03-13: 975 mg via ORAL

## 2023-03-13 NOTE — ED Triage Notes (Addendum)
pt c/o  injury to left leg radiating down leg and right chest area after a fall down the steps this morning. Pt states she can't take a deep breath without pain in her chest behind her breast. Pt fell down steep wooden steps, she had on ballet flats, was on the bottom step at the landing right foot went out from under her the left leg went underneath her in a twisting motion and she hit her ribs on the right side during the fall.

## 2023-03-13 NOTE — Discharge Instructions (Addendum)
The rib x-rays today do not show any fractures of your ribs.  Please start deep breathing exercises to prevent pneumonia and help expand your lungs all the way.  Your ribs may hurt for the next few weeks until they fully improved.  Seek care if symptoms do not improve over time.  The x-ray of your left leg today looks good.  However the foot and ankle fracture show a possible avulsion fracture in your left foot.  I recommended a soft splint, however since you did not want that, we gave you a walking boot.  Please do not walk on the left foot until you follow-up with your orthopedic provider.  Use the crutches to help get around.  Keep your leg and foot elevated when sitting down and apply ice 15 minutes on, 45 minutes off every hour while awake.  Call your orthopedic provider to schedule an appointment.

## 2023-03-13 NOTE — ED Provider Notes (Signed)
[facility]    CSN: [id number] Arrival date & time: 03/13/23  1233      History   Chief Complaint No chief complaint on file.   HPI [name] is a 61 y.o. female.   Patient presents today for a fall that occurred earlier today when she was going down the steps outside her home.  Reports she fell with her right leg outstretched and left leg outstretched and twisted behind her.  She is now having pain in her left leg and foot, ankle.  She states the right side of her rib cage landed against something hard and she is also now having pain with inspiration.  She denies chest pain or shortness of breath.  Reports she has had multiple falls and has broken multiple bones in her body as a result of the falls and vitamin D deficiency.  She is requesting x-rays today.  No numbness or tingling in any of the extremities.  No loss of consciousness or hitting her head.    Past Medical History:  Diagnosis Date   CAD (coronary artery disease), premature-cutting balloon athrectomy LAD 2004, 3 DES-Promus to Prox LCX 2012   04/02/2012   Coronary artery disease    Hyperlipidemia LDL goal < 70 04/02/2012   Hypertension    Metabolic syndrome 04/03/2012   Morbid obesity with BMI of 40.0-44.9, adult (HCC) 04/02/2012   Myocardial infarct (HCC)    Pneumonia    hx of in 03/2010   Shortness of breath    Unstable angina (HCC) 04/02/2012    Patient Active Problem List   Diagnosis Date Noted   MDD (major depressive disorder) 08/11/2022   Chronic musculoskeletal pain 08/11/2022   Restless leg syndrome 08/11/2022   Carpal tunnel syndrome 08/11/2022   Positive screening for depression on 9-item Patient Health Questionnaire (PHQ-9) 06/13/2022   H/O: hysterectomy 05/08/2022   Encounter for general adult medical examination with abnormal findings 05/08/2022   Chest pain 08/16/2017   History of MI (myocardial infarction)    Class 3 severe obesity due to excess calories with body mass index  (BMI) of 40.0 to 44.9 in adult [facility])    Metabolic syndrome 04/03/2012   HTN (hypertension) 04/03/2012   Family history of coronary artery disease 04/03/2012   Unstable angina (HCC) 04/02/2012   CAD (coronary artery disease), premature;  cutting balloon athrectomy LAD 2004, 3 DES-Promus to Prox LCX 2012   04/02/2012   Morbid obesity with BMI of 40.0-44.9, adult (HCC) 04/02/2012   Hyperlipidemia 04/02/2012   Mechanical complication of internal orthopedic device, implant, and graft (HCC) 04/18/2007    Past Surgical History:  Procedure Laterality Date   ABDOMINAL HYSTERECTOMY     ANKLE SURGERY     CORONARY ANGIOPLASTY     LEFT HEART CATHETERIZATION WITH CORONARY ANGIOGRAM N/A 04/03/2012   Procedure: LEFT HEART CATHETERIZATION WITH CORONARY ANGIOGRAM;  Surgeon: Lennette Bihari, MD;  Location[facility] CATH LAB;  Service: Cardiovascular;  Laterality: N/A;   LEG SURGERY Right    plates and screws in right lower leg    OB History   No obstetric history on file.      Home Medications    Prior to Admission medications   Medication Sig Start Date End Date Taking? Authorizing Provider  acetaminophen (TYLENOL) 650 MG CR tablet Take 650 mg by mouth every 8 (eight) hours as needed for pain or fever.    [provider]  aspirin 81 MG tablet Take 1 tablet (81 mg total)  by mouth daily. 08/17/17   Shirley, [place], DO  atorvastatin (LIPITOR) 40 MG tablet Take 1 tablet (40 mg total) by mouth daily. 05/08/22   Billie Lade, MD  DULoxetine (CYMBALTA) 60 MG capsule TAKE ONE CAPSULE (60MG  TOTAL) BY MOUTH DAILY 03/12/23   Billie Lade, MD  metoprolol succinate (TOPROL-XL) 25 MG 24 hr tablet Take 1 tablet (25 mg total) by mouth daily. 06/07/22   Billie Lade, MD  Multiple Vitamins-Minerals (MULTIVITAMIN WITH MINERALS) tablet Take 1 tablet by mouth daily.    [provider]  nitroGLYCERIN (NITROSTAT) 0.4 MG SL tablet Place 1 tablet (0.4 mg total) under the tongue every 5 (five) minutes  as needed for chest pain. If a single episode of chest pain is not relieved by one tablet, the patient will try another within 5 minutes; and if this doesn't relieve the pain, the patient is instructed to call 911 for transportation to an emergency department. 07/21/22 01/26/23  Mallipeddi, Vishnu P, MD  rOPINIRole (REQUIP) 0.25 MG tablet TAKE ONE TABLET (0.25MG  TOTAL) BY MOUTH AT BEDTIME 03/12/23   Billie Lade, MD    Family History Family History  Problem Relation Age of Onset   Coronary artery disease Mother    Cancer - Ovarian Mother    Coronary artery disease Father    Heart attack Brother     Social History Social History   Tobacco Use   Smoking status: Former    Current packs/day: 0.00    Types: Cigarettes    Quit date: 06/12/1990    Years since quitting: 32.7   Smokeless tobacco: Never  Vaping Use   Vaping status: Never Used  Substance Use Topics   Alcohol use: Yes    Comment: occasionally   Drug use: No     Allergies   Niacin, Niacin and related, Penicillins, and Statins   Review of Systems Review of Systems Per HPI  Physical Exam Triage Vital Signs ED Triage Vitals  Encounter Vitals Group     BP 03/13/23 1245 122/76     Systolic BP Percentile --      Diastolic BP Percentile --      Pulse Rate 03/13/23 1245 76     Resp 03/13/23 1245 16     Temp 03/13/23 1245 98.5 F (36.9 C)     Temp Source 03/13/23 1245 Oral     SpO2 03/13/23 1245 94 %     Weight --      Height --      Head Circumference --      Peak Flow --      Pain Score 03/13/23 1248 7     Pain Loc --      Pain Education --      Exclude from Growth Chart --    No data found.  Updated Vital Signs BP 122/76 (BP Location: Right Arm)   Pulse 76   Temp 98.5 F (36.9 C) (Oral)   Resp 16   SpO2 94%   Visual Acuity Right Eye Distance:   Left Eye Distance:   Bilateral Distance:    Right Eye Near:   Left Eye Near:    Bilateral Near:     Physical Exam Vitals and nursing note reviewed.   Constitutional:      General: She is not in acute distress.    Appearance: Normal appearance. She is not toxic-appearing.  HENT:     Mouth/Throat:     Mouth: Mucous membranes are moist.  Pharynx: Oropharynx is clear.  Pulmonary:     Effort: Pulmonary effort is normal. No respiratory distress.     Breath sounds: No wheezing, rhonchi or rales.  Chest:     Chest wall: Tenderness present.       Comments: Point tenderness to area marked; no bruising, obvious deformity, or redness Musculoskeletal:     Comments: Inspection: mild swelling and bruising to left lateral malleolus, 3rd-5th proximal phalanges, and left fibula; no obvious deformity or redness Palpation: tender to palpation to left ankle, left fibula over bruising, and left 3rd-5th phalanges; no obvious deformities palpated ROM: difficult to assess secondary to pain Strength: difficult to assess secondary to pain Neurovascular: neurovascularly intact in left lower extremitiy  Skin:    General: Skin is warm and dry.     Capillary Refill: Capillary refill takes less than 2 seconds.     Coloration: Skin is not jaundiced or pale.     Findings: No erythema.  Neurological:     Mental Status: She is alert and oriented to person, place, and time.  Psychiatric:        Behavior: Behavior is cooperative.      UC Treatments / Results  Labs (all labs ordered are listed, but only abnormal results are displayed) Labs Reviewed - No data to display  EKG   Radiology DG Ribs Unilateral W/Chest Right  Result Date: 03/13/2023 CLINICAL DATA:  Post fall, now with right rib pain. EXAM: RIGHT RIBS AND CHEST - 3+ VIEW COMPARISON:  Chest radiograph - 03/01/2023 FINDINGS: Unchanged cardiac silhouette and mediastinal contours with atherosclerotic plaque within the aortic arch. There is mild elevation/eventration of the right hemidiaphragm. No focal airspace opacities. No pleural effusion or pneumothorax. No evidence of edema. No definite acute  osseous abnormalities. No definite displaced right-sided rib fractures. Regional soft tissues appear normal. IMPRESSION: 1. No acute cardiopulmonary disease. 2. No definite displaced right-sided rib fractures. Electronically Signed   By: Simonne Come M.D.   On: 03/13/2023 15:39   DG Foot Complete Left  Result Date: 03/13/2023 CLINICAL DATA:  Post fall, now with left foot and ankle pain. EXAM: LEFT FOOT - COMPLETE 3+ VIEW COMPARISON:  Left ankle radiographs-earlier same day FINDINGS: Age-indeterminate ossicle adjacent to the anterolateral aspect of the calcaneus likely represents the sequela of age-indeterminate avulsive injury. Otherwise, no acute fracture or dislocation. Mild hallux valgus deformity with associated mild degenerative change of the first MTP joint. Moderate to severe degenerative change of several midfoot articulations. Moderate-sized plantar calcaneal spur. Enthesopathic change involving the Achilles tendon insertion site. IMPRESSION: 1. Age-indeterminate ossicle adjacent to the anterolateral aspect of the calcaneus likely represents the sequela of age-indeterminate avulsive injury. Correlation for point tenderness at this location is advised. 2. Otherwise, no acute fracture or dislocation. 3. Degenerative changes as described. 4. Moderate-sized plantar calcaneal spur. Electronically Signed   By: Simonne Come M.D.   On: 03/13/2023 15:38   DG Ankle Complete Left  Result Date: 03/13/2023 CLINICAL DATA:  Post fall down stairs, now with left foot and ankle pain. EXAM: LEFT ANKLE COMPLETE - 3+ VIEW COMPARISON:  Left foot radiographs-earlier same day FINDINGS: There is a peripherally corticated ossicle adjacent to the mid/hindfoot, seen on the provided AP radiograph which may represent an age avulsion injury. Otherwise, no acute fracture or dislocation. Peripherally corticated ossicle adjacent to the medial malleolus, likely represents sequela of remote avulsion injury. The ankle mortise is  preserved. Degenerative change of several midfoot articulations. Moderate-to-large sized plantar calcaneal spur. Enthesopathic change  involving the Achilles tendon insertion site. Regional soft tissues appear normal. IMPRESSION: 1. Peripherally corticated ossicle adjacent to the mid/hindfoot, seen on the provided AP radiograph, may represent an age-indeterminate avulsion injury. Correlation for point tenderness at this location is advised. 2. Otherwise, no acute fracture or dislocation. 3. Degenerative change of several midfoot articulations. 4. Moderate-to-large sized plantar calcaneal spur. Electronically Signed   By: Simonne Come M.D.   On: 03/13/2023 15:36   DG Tibia/Fibula Left  Result Date: 03/13/2023 CLINICAL DATA:  Post fall downstairs today, now with left lower leg pain. EXAM: LEFT TIBIA AND FIBULA - 2 VIEW COMPARISON:  Left ankle radiographs-earlier same day FINDINGS: Age-indeterminate crescentic calcification overlying the lateral aspect of the left midfoot seen on the provided AP radiograph. Limited visualization of the adjacent knee suggests mild tricompartmental degenerative change. Enthesopathic change involving the medial femoral condyle may represent the sequela of remote MCL injury (Pellegrini-Stieda). Peripherally corticated ossicle adjacent to the medial malleolus and superior to the talar beak are favored to represent sequela of remote avulsive injuries. Small to moderate-sized plantar calcaneal spur. Enthesopathic change involving the Achilles tendon insertion site. Regional soft tissues appear normal. No radiopaque foreign body. IMPRESSION: 1. Age-indeterminate crescentic calcification overlying the lateral aspect of the left midfoot seen on the provided AP radiograph. Correlation for point tenderness at this location is advised. 2. Otherwise, no acute findings. Electronically Signed   By: Simonne Come M.D.   On: 03/13/2023 14:32    Procedures Procedures (including critical care  time)  Medications Ordered in UC Medications  acetaminophen (TYLENOL) tablet 975 mg (975 mg Oral Given 03/13/23 1423)    Initial Impression / Assessment and Plan / UC Course  I have reviewed the triage vital signs and the nursing notes.  Pertinent labs & imaging results that were available during my care of the patient were reviewed by me and considered in my medical decision making (see chart for details).   Patient is well-appearing, normotensive, afebrile, not tachycardic, not tachypneic, oxygenating well on room air.    1. Fall, initial encounter 2. Contusion of left lower extremity, initial encounter 3. Costochondritis 4. Avulsion of left foot excluding toes, initial encounter Overall, vital signs and exam are reassuring Rib and chest x-ray imaging is negative for acute rib fracture or pneumothorax Foot and ankle x-ray show corticated ossicle adjacent to the malleolus, concern for possible avulsion fracture of the talus I recommended short leg with stirrup splint, and close follow-up with orthopedic provider Patient reports she wants to go to the beach and wants to be able to take a splint/cast off and on and is requesting walking boot today Walking boot ordered, recommended nonweightbearing status and crutches given as well Other supportive care discussed Recommended close follow-up with Ortho for further evaluation/management  The patient was given the opportunity to ask questions.  All questions answered to their satisfaction.  The patient is in agreement to this plan.   Final Clinical Impressions(s) / UC Diagnoses   Final diagnoses:  Fall, initial encounter  Contusion of left lower extremity, initial encounter  Costochondritis  Avulsion of left foot excluding toes, initial encounter     Discharge Instructions      The rib x-rays today do not show any fractures of your ribs.  Please start deep breathing exercises to prevent pneumonia and help expand your lungs all the  way.  Your ribs may hurt for the next few weeks until they fully improved.  Seek care if symptoms do not improve  over time.  The x-ray of your left leg today looks good.  However the foot and ankle fracture show a possible avulsion fracture in your left foot.  I recommended a soft splint, however since you did not want that, we gave you a walking boot.  Please do not walk on the left foot until you follow-up with your orthopedic provider.  Use the crutches to help get around.  Keep your leg and foot elevated when sitting down and apply ice 15 minutes on, 45 minutes off every hour while awake.  Call your orthopedic provider to schedule an appointment.     ED Prescriptions   None    PDMP not reviewed this encounter.   Valentino Nose, NP 03/13/23 1714

## 2023-03-14 ENCOUNTER — Encounter: Payer: Self-pay | Admitting: Orthopedic Surgery

## 2023-03-14 ENCOUNTER — Ambulatory Visit: Payer: 59 | Admitting: Orthopedic Surgery

## 2023-03-14 VITALS — BP 148/84 | HR 90

## 2023-03-14 DIAGNOSIS — S92252A Displaced fracture of navicular [scaphoid] of left foot, initial encounter for closed fracture: Secondary | ICD-10-CM | POA: Diagnosis not present

## 2023-03-14 NOTE — Patient Instructions (Signed)

## 2023-03-14 NOTE — Progress Notes (Signed)
Return patient Visit  Assessment: [name] is a 62 y.o. female with the following: 1. Closed avulsion fracture of navicular bone of left foot, initial encounter  Plan: Khrystal Jeanmarie Gallardo fell and sustained an avulsion fracture of the left foot.  She has some bruising and swelling on physical exam today.  I discussed the findings with her in clinic.  She can continue with the walking boot.  Okay to bear weight as tolerated.  Okay to initiate range of motion.  Elevate the foot double swelling.  Medications as needed.  She denies to help with swelling and bruising.  I would like see her back in 2 weeks to ensure that she continues to improve.  Follow-up: Return in about 2 weeks (around 03/28/2023).  Subjective:  Chief Complaint  Patient presents with   Ankle Pain    DOI 03-13-2023 fell down steps    History of Present Illness: [name] is a 62 y.o. female who returns for evaluation of left ankle pain.  Yesterday, she was at home, when she lost her balance going downstairs.  Her left leg twisted behind her.  She had immediate pain.  She presented to an urgent care center.  She was convinced that she had sustained an injury to the fibula.  Radiographs obtained in the urgent care demonstrated possible avulsion fracture of the left foot.  She was placed in a boot.  She is given crutches.  She is to take Tylenol and ibuprofen as needed.  She states it was better this morning, when she had minimal swelling.     Review of Systems: No fevers or chills No numbness or tingling No chest pain No shortness of breath No bowel or bladder dysfunction No GI distress No headaches    Objective: BP (!) 148/84   Pulse 90   Physical Exam:  General: Alert and oriented. and No acute distress. Gait: Left sided antalgic gait.  Ambulating in a walking boot with crutches.  Evaluation left ankle demonstrates bruising and swelling, diffusely over the anterior lateral aspect of the ankle.  She is  tenderness to palpation in the area of the ATFL.  She does have some tenderness to palpation within the peroneal musculature.  Toes are warm and well-perfused.  Sensation intact to the dorsum of the foot.    IMAGING: I personally reviewed images previously obtained from the ED  X-rays of the left foot and ankle demonstrates an anterolateral ankle avulsion fracture.  Additional x-rays are negative.   New Medications:  No orders of the defined types were placed in this encounter.     Oliver Barre, MD  03/14/2023 3:00 PM

## 2023-03-16 ENCOUNTER — Encounter (HOSPITAL_COMMUNITY): Payer: 59

## 2023-03-20 ENCOUNTER — Ambulatory Visit: Payer: 59 | Admitting: Internal Medicine

## 2023-03-23 ENCOUNTER — Encounter (HOSPITAL_COMMUNITY): Payer: 59

## 2023-03-27 ENCOUNTER — Encounter: Payer: Self-pay | Admitting: Orthopedic Surgery

## 2023-03-27 ENCOUNTER — Other Ambulatory Visit (INDEPENDENT_AMBULATORY_CARE_PROVIDER_SITE_OTHER): Payer: Self-pay

## 2023-03-27 ENCOUNTER — Encounter: Payer: Self-pay | Admitting: Internal Medicine

## 2023-03-27 ENCOUNTER — Ambulatory Visit (INDEPENDENT_AMBULATORY_CARE_PROVIDER_SITE_OTHER): Payer: 59 | Admitting: Orthopedic Surgery

## 2023-03-27 DIAGNOSIS — S92252D Displaced fracture of navicular [scaphoid] of left foot, subsequent encounter for fracture with routine healing: Secondary | ICD-10-CM

## 2023-03-27 DIAGNOSIS — S92252A Displaced fracture of navicular [scaphoid] of left foot, initial encounter for closed fracture: Secondary | ICD-10-CM

## 2023-03-27 NOTE — Progress Notes (Unsigned)
Return patient Visit  Assessment: [name] is a 62 y.o. female with the following: 1. Closed avulsion fracture of navicular bone of left foot, subsequent encounter  Plan: Yeraldy Spike Mccluney fell and sustained an avulsion fracture of the left foot.  [name] has some bruising and swelling on physical exam today.  I discussed the findings with her in clinic.  [name] can continue with the walking boot.  Okay to bear weight as tolerated.  Okay to initiate range of motion.  Elevate the foot double swelling.  Medications as needed.  [name] denies to help with swelling and bruising.  I would like see her back in 2 weeks to ensure that [name] continues to improve.  Follow-up: Return in about 4 weeks (around 04/24/2023).  Subjective:  Chief Complaint  Patient presents with   Fracture    L foot DOI 03/13/23    History of Present Illness: [name] is a 62 y.o. female who returns for evaluation of left ankle pain.  Yesterday, [name] was at home, when [name] lost her balance going downstairs.  Her left leg twisted behind her.  [name] had immediate pain.  [name] presented to an urgent care center.  [name] was convinced that [name] had sustained an injury to the fibula.  Radiographs obtained in the urgent care demonstrated possible avulsion fracture of the left foot.  [name] was placed in a boot.  [name] is given crutches.  [name] is to take Tylenol and ibuprofen as needed.  [name] states it was better this morning, when [name] had minimal swelling.     Review of Systems: No fevers or chills No numbness or tingling No chest pain No shortness of breath No bowel or bladder dysfunction No GI distress No headaches    Objective: There were no vitals taken for this visit.  Physical Exam:  General: Alert and oriented. and No acute distress. Gait: Left sided antalgic gait.  Ambulating in a walking boot with crutches.  Evaluation left ankle demonstrates bruising and swelling, diffusely over the anterior lateral aspect of the ankle.   [name] is tenderness to palpation in the area of the ATFL.  [name] does have some tenderness to palpation within the peroneal musculature.  Toes are warm and well-perfused.  Sensation intact to the dorsum of the foot.    IMAGING: I personally reviewed images previously obtained from the ED  X-rays of the left foot and ankle demonstrates an anterolateral ankle avulsion fracture.  Additional x-rays are negative.   New Medications:  No orders of the defined types were placed in this encounter.     Oliver Barre, MD  03/27/2023 4:02 PM

## 2023-03-27 NOTE — Patient Instructions (Signed)

## 2023-03-28 ENCOUNTER — Encounter: Payer: 59 | Admitting: Orthopedic Surgery

## 2023-03-30 ENCOUNTER — Encounter (HOSPITAL_COMMUNITY): Payer: 59

## 2023-04-04 ENCOUNTER — Telehealth: Payer: Self-pay

## 2023-04-04 NOTE — Telephone Encounter (Signed)
Transition Care Management Follow-up Telephone Call Date of discharge and from where: [name] 9/19 How have you been since you were released from the hospital? Doing fine and has has been following up with providers Any questions or concerns? No  Items Reviewed: Did the pt receive and understand the discharge instructions provided? Yes  Medications obtained and verified? No  Other? No  Any new allergies since your discharge? No  Dietary orders reviewed? No Do you have support at home? Yes     Follow up appointments reviewed:  PCP Hospital f/u appt confirmed? Yes  Scheduled to see PCP on 11/4 @ . Specialist Hospital f/u appt confirmed? Yes  Scheduled to see  on  @ . Are transportation arrangements needed? No  If their condition worsens, is the pt aware to call PCP or go to the Emergency Dept.? Yes Was the patient provided with contact information for the PCP's office or ED? Yes Was to pt encouraged to call back with questions or concerns? Yes

## 2023-04-06 ENCOUNTER — Encounter (HOSPITAL_COMMUNITY): Payer: 59

## 2023-04-13 ENCOUNTER — Encounter (HOSPITAL_COMMUNITY): Payer: 59

## 2023-04-16 ENCOUNTER — Encounter: Payer: Self-pay | Admitting: Internal Medicine

## 2023-04-16 ENCOUNTER — Ambulatory Visit (INDEPENDENT_AMBULATORY_CARE_PROVIDER_SITE_OTHER): Payer: 59 | Admitting: Internal Medicine

## 2023-04-16 VITALS — BP 141/86 | HR 79 | Ht 67.0 in | Wt 267.0 lb

## 2023-04-16 DIAGNOSIS — F330 Major depressive disorder, recurrent, mild: Secondary | ICD-10-CM | POA: Diagnosis not present

## 2023-04-16 DIAGNOSIS — S92252S Displaced fracture of navicular [scaphoid] of left foot, sequela: Secondary | ICD-10-CM | POA: Insufficient documentation

## 2023-04-16 DIAGNOSIS — Z1211 Encounter for screening for malignant neoplasm of colon: Secondary | ICD-10-CM | POA: Insufficient documentation

## 2023-04-16 DIAGNOSIS — S92252D Displaced fracture of navicular [scaphoid] of left foot, subsequent encounter for fracture with routine healing: Secondary | ICD-10-CM

## 2023-04-16 DIAGNOSIS — Z6841 Body Mass Index (BMI) 40.0 and over, adult: Secondary | ICD-10-CM | POA: Diagnosis not present

## 2023-04-16 NOTE — Progress Notes (Signed)
Established Patient Office Visit  Subjective   Patient ID: [name], female    DOB: 1961/01/08  Age: 62 y.o. MRN: [id number]  Chief Complaint  Patient presents with   Follow-up    Follow up    [name] returns to care today for routine follow-up.  She was last evaluated by me through video encounter for follow-up of MDD on 7/5.  No medication changes were made at that time and 57-month follow-up was arranged.  In the interim, she has been evaluated by cardiology and orthopedic surgery.  ED presentation on 10/1 in the setting of a fall while walking down steps outside her home.  She suffered an avulsion fracture of the left navicular bone.  She was placed in a cam walking boot.  There have otherwise been no acute interval events.  [name] reports feeling well today.  She is asymptomatic and has no acute concerns to discuss.  She reports that her insurance will start covering weight loss medications after the first of the year.  Past Medical History:  Diagnosis Date   CAD (coronary artery disease), premature-cutting balloon athrectomy LAD 2004, 3 DES-Promus to Prox LCX 2012   04/02/2012   Coronary artery disease    Hyperlipidemia LDL goal < 70 04/02/2012   Hypertension    Metabolic syndrome 04/03/2012   Morbid obesity with BMI of 40.0-44.9, adult (HCC) 04/02/2012   Myocardial infarct (HCC)    Pneumonia    hx of in 03/2010   Shortness of breath    Unstable angina (HCC) 04/02/2012   Past Surgical History:  Procedure Laterality Date   ABDOMINAL HYSTERECTOMY     ANKLE SURGERY     CORONARY ANGIOPLASTY     LEFT HEART CATHETERIZATION WITH CORONARY ANGIOGRAM N/A 04/03/2012   Procedure: LEFT HEART CATHETERIZATION WITH CORONARY ANGIOGRAM;  Surgeon: Lennette Bihari, MD;  Location: [facility] CATH LAB;  Service: Cardiovascular;  Laterality: N/A;   LEG SURGERY Right    plates and screws in right lower leg   Social History   Tobacco Use   Smoking status: Former    Current packs/day:  0.00    Types: Cigarettes    Quit date: 06/12/1990    Years since quitting: 32.8   Smokeless tobacco: Never  Vaping Use   Vaping status: Never Used  Substance Use Topics   Alcohol use: Yes    Comment: occasionally   Drug use: No   Family History  Problem Relation Age of Onset   Coronary artery disease Mother    Cancer - Ovarian Mother    Coronary artery disease Father    Heart attack Brother    Allergies  Allergen Reactions   Niacin Other (See Comments)   Niacin And Related    Penicillins Nausea And Vomiting    Pt reports the vomiting occurred when she was 62 y.o. and she has taken Penicillin since for dental procedure and tolerated it fine.    Statins    Review of Systems  Constitutional:  Negative for chills and fever.  HENT:  Negative for sore throat.   Respiratory:  Negative for cough and shortness of breath.   Cardiovascular:  Negative for chest pain, palpitations and leg swelling.  Gastrointestinal:  Negative for abdominal pain, blood in stool, constipation, diarrhea, nausea and vomiting.  Genitourinary:  Negative for dysuria and hematuria.  Musculoskeletal:  Negative for myalgias.  Skin:  Negative for itching and rash.  Neurological:  Negative for dizziness and headaches.  Psychiatric/Behavioral:  Negative for depression and suicidal ideas.      Objective:     BP (!) 141/86 (BP Location: Left Arm, Patient Position: Sitting, Cuff Size: Large)   Pulse 79   Ht 5\' 7"  (1.702 m)   Wt 267 lb 0.6 oz (121.1 kg)   SpO2 94%   BMI 41.82 kg/m  BP Readings from Last 3 Encounters:  04/16/23 (!) 141/86  03/14/23 (!) 148/84  03/13/23 122/76   Physical Exam Vitals reviewed.  Constitutional:      General: She is not in acute distress.    Appearance: Normal appearance. She is obese. She is not toxic-appearing.  HENT:     Head: Normocephalic and atraumatic.     Right Ear: External ear normal.     Left Ear: External ear normal.     Nose: Nose normal. No congestion or  rhinorrhea.     Mouth/Throat:     Mouth: Mucous membranes are moist.     Pharynx: Oropharynx is clear. No oropharyngeal exudate or posterior oropharyngeal erythema.  Eyes:     General: No scleral icterus.    Extraocular Movements: Extraocular movements intact.     Conjunctiva/sclera: Conjunctivae normal.     Pupils: Pupils are equal, round, and reactive to light.  Cardiovascular:     Rate and Rhythm: Normal rate and regular rhythm.     Pulses: Normal pulses.     Heart sounds: Normal heart sounds. No murmur heard.    No friction rub. No gallop.  Pulmonary:     Effort: Pulmonary effort is normal.     Breath sounds: Normal breath sounds. No wheezing, rhonchi or rales.  Abdominal:     General: Abdomen is flat. Bowel sounds are normal. There is no distension.     Palpations: Abdomen is soft.     Tenderness: There is no abdominal tenderness.  Musculoskeletal:        General: No swelling. Normal range of motion.     Cervical back: Normal range of motion.     Right lower leg: No edema.     Left lower leg: No edema.  Lymphadenopathy:     Cervical: No cervical adenopathy.  Skin:    General: Skin is warm and dry.     Capillary Refill: Capillary refill takes less than 2 seconds.     Coloration: Skin is not jaundiced.  Neurological:     General: No focal deficit present.     Mental Status: She is alert and oriented to person, place, and time.  Psychiatric:        Mood and Affect: Mood normal.        Behavior: Behavior normal.   Last CBC Lab Results  Component Value Date   WBC 9.9 11/17/2022   HGB 13.5 11/17/2022   HCT 40.7 11/17/2022   MCV 82 11/17/2022   MCH 27.2 11/17/2022   RDW 13.7 11/17/2022   PLT 257 11/17/2022   Last metabolic panel Lab Results  Component Value Date   GLUCOSE 100 (H) 11/17/2022   NA 138 11/17/2022   K 4.8 11/17/2022   CL 102 11/17/2022   CO2 23 11/17/2022   BUN 11 11/17/2022   CREATININE 0.76 11/17/2022   EGFR 88 11/17/2022   CALCIUM 10.4 (H)  11/17/2022   PROT 7.4 11/17/2022   ALBUMIN 4.3 11/17/2022   LABGLOB 3.1 11/17/2022   AGRATIO 1.4 11/17/2022   BILITOT 0.3 11/17/2022   ALKPHOS 123 (H) 11/17/2022   AST 20 11/17/2022   ALT 16  11/17/2022   ANIONGAP 10 02/24/2022   Last lipids Lab Results  Component Value Date   CHOL 151 11/17/2022   HDL 48 11/17/2022   LDLCALC 77 11/17/2022   TRIG 151 (H) 11/17/2022   CHOLHDL 3.1 11/17/2022   Last hemoglobin A1c Lab Results  Component Value Date   HGBA1C 6.4 (H) 11/17/2022   Last thyroid functions Lab Results  Component Value Date   TSH 1.430 11/17/2022   Last vitamin D Lab Results  Component Value Date   VD25OH 34.5 11/17/2022   Last vitamin B12 and Folate Lab Results  Component Value Date   VITAMINB12 529 11/17/2022   FOLATE 18.1 11/17/2022   The 10-year ASCVD risk score (Arnett DK, et al., 2019) is: 6.6%    Assessment & Plan:   Problem List Items Addressed This Visit       Closed avulsion fracture of navicular bone of foot, left, sequela    [name] fell down her stairs in early October, suffering an avulsion fracture of the navicular bone in her left foot.  She was placed in a cam walking boot and was evaluated by orthopedic surgery (Dr. Dallas Schimke).  Recently cleared to stop wearing the boot.  Reports that pain has resolved.      Morbid obesity with BMI of 40.0-44.9, adult (HCC) (Chronic)    BMI 41.8.  Her weight today is 267 pounds.  We have previously tried to start [place] and Zepbound but it is not covered by insurance.  She reports today that her insurance company will start covering medications for weight loss after the first of the year.  We will arrange follow-up in 2 months through video visit to discuss starting medication for weight loss.      MDD (major depressive disorder)    Reports that her mood is stable on Cymbalta 60 mg daily.  We discussed weaning off of Vraylar in August as she believed it was making her symptoms worse.  She is not  interested in making any additional medication changes at this time.      Colon cancer screening - Primary    Cologuard ordered again today.  States that she did not receive the previous kit.       Return in about 2 months (around 06/16/2023) for weight loss medication.    Billie Lade, MD

## 2023-04-16 NOTE — Assessment & Plan Note (Signed)
BMI 41.8.  Her weight today is 267 pounds.  We have previously tried to start [place] and Zepbound but it is not covered by insurance.  She reports today that her insurance company will start covering medications for weight loss after the first of the year.  We will arrange follow-up in 2 months through video visit to discuss starting medication for weight loss.

## 2023-04-16 NOTE — Assessment & Plan Note (Signed)
Reports that her mood is stable on Cymbalta 60 mg daily.  We discussed weaning off of Vraylar in August as she believed it was making her symptoms worse.  She is not interested in making any additional medication changes at this time.

## 2023-04-16 NOTE — Assessment & Plan Note (Signed)
[name] fell down her stairs in early October, suffering an avulsion fracture of the navicular bone in her left foot.  She was placed in a cam walking boot and was evaluated by orthopedic surgery (Dr. Dallas Schimke).  Recently cleared to stop wearing the boot.  Reports that pain has resolved.

## 2023-04-16 NOTE — Assessment & Plan Note (Signed)
Cologuard ordered again today.  States that she did not receive the previous kit.

## 2023-04-16 NOTE — Patient Instructions (Signed)
It was a pleasure to see you today.  Thank you for giving [place] the opportunity to be involved in your care.  Below is a brief recap of your visit and next steps.  We will plan to see you again in January.  Summary No medication changes today. No repeat labs Follow up after first of year to discuss weight loss medications

## 2023-04-20 DIAGNOSIS — H5203 Hypermetropia, bilateral: Secondary | ICD-10-CM | POA: Diagnosis not present

## 2023-04-24 ENCOUNTER — Encounter: Payer: 59 | Admitting: Orthopedic Surgery

## 2023-04-24 ENCOUNTER — Encounter: Payer: Self-pay | Admitting: Internal Medicine

## 2023-04-25 ENCOUNTER — Encounter (HOSPITAL_COMMUNITY): Payer: Self-pay

## 2023-04-25 NOTE — Therapy (Signed)
[facility] Out[facility] Rehabilitation at [facility] [address] [place], [place], [address] Phone: [phone]   Fax:  [phone]  Patient Details  Name: [name] MRN: [id number] Date of Birth: 1960-08-27 Referring Provider:  No ref. provider found  Encounter Date: 04/25/2023 PHYSICAL THERAPY DISCHARGE SUMMARY  Visits from Start of Care: 1  Current functional level related to goals / functional outcomes: Not returned since evaluation.   Remaining deficits: Not returned since evaluation.    Education / Equipment: NA   Patient being discharged. Patient goals were not met. Patient is being discharged due to not returning since the last visit.   Nelida Meuse, PT 04/25/2023, 3:46 PM  [facility] Outpatient Rehabilitation at [facility] [address] [place], [place], [address] Phone: [phone]   Fax:  [phone]

## 2023-06-18 ENCOUNTER — Other Ambulatory Visit (HOSPITAL_COMMUNITY): Payer: Self-pay

## 2023-06-18 ENCOUNTER — Telehealth: Payer: 59 | Admitting: Internal Medicine

## 2023-06-18 ENCOUNTER — Encounter: Payer: Self-pay | Admitting: Internal Medicine

## 2023-06-18 DIAGNOSIS — Z6841 Body Mass Index (BMI) 40.0 and over, adult: Secondary | ICD-10-CM

## 2023-06-18 DIAGNOSIS — E8881 Metabolic syndrome: Secondary | ICD-10-CM

## 2023-06-18 MED ORDER — SEMAGLUTIDE-WEIGHT MANAGEMENT 0.25 MG/0.5ML ~~LOC~~ SOAJ
0.2500 mg | SUBCUTANEOUS | 0 refills | Status: DC
Start: 2023-06-18 — End: 2023-07-05
  Filled 2023-06-18 – 2023-06-21 (×3): qty 2, 28d supply, fill #0

## 2023-06-18 NOTE — Assessment & Plan Note (Signed)
 Last documented weight was 267 pounds.  BMI 41.8.  Following up today through virtual encounter to discuss medication options for weight loss.  She reports that her insurance will now cover medications such as Wegovy  and Zepbound.  She has focused on lifestyle modifications aimed at weight loss since her last appointment. -Through shared decision making, Wegovy  0.25 mg weekly was prescribed today.  Lifestyle modifications aimed at weight loss were reinforced.  We will tentatively plan for follow-up in 3 months for weight loss management.

## 2023-06-18 NOTE — Progress Notes (Signed)
 Virtual Visit via Video Note  I connected with [name] on 06/18/23 at 11:20 AM EST by a video enabled telemedicine application and verified that I am speaking with the correct person using two identifiers.  Patient Location: Home Provider Location: Office/Clinic  I discussed the limitations, risks, security, and privacy concerns of performing an evaluation and management service by video and the availability of in person appointments. I also discussed with the patient that there may be a patient responsible charge related to this service. The patient expressed understanding and agreed to proceed.  Subjective: PCP: Melvenia Manus BRAVO, MD  Chief Complaint  Patient presents with   Obesity    Weight loss options    [name] has been evaluated today through video encounter in the setting of morbid obesity to discuss medication options for weight loss.  Previously evaluated by me on 11/4 at which time she reported that her insurance would start covering weight loss medications in 2025.  She reports feeling well today and has no additional concerns to discuss.  [name] has been monitoring her carbohydrate intake and drinking mostly water.  She has not been drinking soft drinks.  [name] has started walking regularly and performing physical therapy exercises.  She remains interested in starting medication for weight loss.  ROS: Per HPI  Current Outpatient Medications:    Semaglutide -Weight Management 0.25 MG/0.5ML SOAJ, Inject 0.25 mg into the skin once a week for 28 days., Disp: 2 mL, Rfl: 0   acetaminophen  (TYLENOL ) 650 MG CR tablet, Take 650 mg by mouth every 8 (eight) hours as needed for pain or fever., Disp: , Rfl:    aspirin  81 MG tablet, Take 1 tablet (81 mg total) by mouth daily., Disp: 30 tablet, Rfl: 0   atorvastatin  (LIPITOR ) 40 MG tablet, Take 1 tablet (40 mg total) by mouth daily., Disp: 90 tablet, Rfl: 3   DULoxetine  (CYMBALTA ) 60 MG capsule, TAKE ONE CAPSULE  (60MG  TOTAL) BY MOUTH DAILY, Disp: 90 capsule, Rfl: 1   metoprolol  succinate (TOPROL -XL) 25 MG 24 hr tablet, Take 1 tablet (25 mg total) by mouth daily., Disp: 90 tablet, Rfl: 3   Multiple Vitamins-Minerals (MULTIVITAMIN WITH MINERALS) tablet, Take 1 tablet by mouth daily., Disp: , Rfl:    nitroGLYCERIN  (NITROSTAT ) 0.4 MG SL tablet, Place 1 tablet (0.4 mg total) under the tongue every 5 (five) minutes as needed for chest pain. If a single episode of chest pain is not relieved by one tablet, the patient will try another within 5 minutes; and if this doesn't relieve the pain, the patient is instructed to call 911 for transportation to an emergency department., Disp: 25 tablet, Rfl: 3   rOPINIRole  (REQUIP ) 0.25 MG tablet, TAKE ONE TABLET (0.25MG  TOTAL) BY MOUTH AT BEDTIME, Disp: 30 tablet, Rfl: 0  Assessment and Plan:  Morbid obesity with BMI of 40.0-44.9, adult [facility]) Assessment & Plan: Last documented weight was 267 pounds.  BMI 41.8.  Following up today through virtual encounter to discuss medication options for weight loss.  She reports that her insurance will now cover medications such as Wegovy  and Zepbound.  She has focused on lifestyle modifications aimed at weight loss since her last appointment. -Through shared decision making, Wegovy  0.25 mg weekly was prescribed today.  Lifestyle modifications aimed at weight loss were reinforced.  We will tentatively plan for follow-up in 3 months for weight loss management.  Follow Up Instructions: Return in about 3 months (around 09/16/2023).   I discussed the  assessment and treatment plan with the patient. The patient was provided an opportunity to ask questions, and all were answered. The patient agreed with the plan and demonstrated an understanding of the instructions.   The patient was advised to call back or seek an in-person evaluation if the symptoms worsen or if the condition fails to improve as anticipated.  The above assessment and management  plan was discussed with the patient. The patient verbalized understanding of and has agreed to the management plan.   Manus FORBES Fireman, MD

## 2023-06-19 ENCOUNTER — Other Ambulatory Visit (HOSPITAL_COMMUNITY): Payer: Self-pay

## 2023-06-21 ENCOUNTER — Other Ambulatory Visit (HOSPITAL_COMMUNITY): Payer: Self-pay

## 2023-07-05 ENCOUNTER — Other Ambulatory Visit (HOSPITAL_COMMUNITY): Payer: Self-pay

## 2023-07-05 ENCOUNTER — Encounter: Payer: Self-pay | Admitting: Internal Medicine

## 2023-07-05 DIAGNOSIS — E8881 Metabolic syndrome: Secondary | ICD-10-CM

## 2023-07-05 DIAGNOSIS — Z6841 Body Mass Index (BMI) 40.0 and over, adult: Secondary | ICD-10-CM

## 2023-07-05 MED ORDER — SEMAGLUTIDE-WEIGHT MANAGEMENT 0.5 MG/0.5ML ~~LOC~~ SOAJ
0.5000 mg | SUBCUTANEOUS | 0 refills | Status: DC
Start: 2023-07-05 — End: 2023-08-02
  Filled 2023-07-05: qty 2, 28d supply, fill #0

## 2023-07-25 ENCOUNTER — Encounter: Payer: Self-pay | Admitting: Internal Medicine

## 2023-07-25 LAB — COLOGUARD: COLOGUARD: NEGATIVE

## 2023-07-31 ENCOUNTER — Other Ambulatory Visit: Payer: Self-pay | Admitting: Internal Medicine

## 2023-07-31 DIAGNOSIS — I10 Essential (primary) hypertension: Secondary | ICD-10-CM

## 2023-07-31 DIAGNOSIS — I2510 Atherosclerotic heart disease of native coronary artery without angina pectoris: Secondary | ICD-10-CM

## 2023-07-31 DIAGNOSIS — E782 Mixed hyperlipidemia: Secondary | ICD-10-CM

## 2023-08-02 ENCOUNTER — Other Ambulatory Visit (HOSPITAL_COMMUNITY): Payer: Self-pay

## 2023-08-02 ENCOUNTER — Other Ambulatory Visit: Payer: Self-pay | Admitting: Internal Medicine

## 2023-08-02 DIAGNOSIS — E8881 Metabolic syndrome: Secondary | ICD-10-CM

## 2023-08-02 MED ORDER — WEGOVY 1 MG/0.5ML ~~LOC~~ SOAJ
1.0000 mg | SUBCUTANEOUS | 0 refills | Status: DC
  Filled 2023-08-02 – 2023-08-09 (×2): qty 2, 28d supply, fill #0

## 2023-08-09 ENCOUNTER — Other Ambulatory Visit: Payer: Self-pay

## 2023-08-31 ENCOUNTER — Other Ambulatory Visit (HOSPITAL_COMMUNITY): Payer: Self-pay

## 2023-08-31 ENCOUNTER — Encounter: Payer: Self-pay | Admitting: Internal Medicine

## 2023-08-31 DIAGNOSIS — Z6841 Body Mass Index (BMI) 40.0 and over, adult: Secondary | ICD-10-CM

## 2023-08-31 DIAGNOSIS — E8881 Metabolic syndrome: Secondary | ICD-10-CM

## 2023-08-31 MED ORDER — WEGOVY 1.7 MG/0.75ML ~~LOC~~ SOAJ
1.7000 mg | SUBCUTANEOUS | 0 refills | Status: DC
  Filled 2023-08-31: qty 3, 28d supply, fill #0

## 2023-09-18 ENCOUNTER — Ambulatory Visit: Payer: 59 | Admitting: Internal Medicine

## 2023-09-18 ENCOUNTER — Encounter: Payer: Self-pay | Admitting: Internal Medicine

## 2023-09-18 VITALS — BP 130/83 | HR 81 | Ht 67.0 in | Wt 232.0 lb

## 2023-09-18 DIAGNOSIS — E669 Obesity, unspecified: Secondary | ICD-10-CM | POA: Diagnosis not present

## 2023-09-18 NOTE — Patient Instructions (Signed)
It was a pleasure to see you today.  Thank you for giving us the opportunity to be involved in your care.  Below is a brief recap of your visit and next steps.  We will plan to see you again in 3 months.  Summary No medication changes today. We will plan for follow up in 3 months.   

## 2023-09-18 NOTE — Assessment & Plan Note (Signed)
 Returning to care today for weight management follow-up.  [name] was started at her last appointment and January.  Her weight at that time was 267 pounds.  She is down 35 pounds to 232 pounds today.  BMI 36.3.  She is tolerating Wegovy well and has not experienced any significant adverse side effects.  Currently prescribed 1.7 mg weekly.  [name] was congratulated on her progress and encouraged to maintain lifestyle modifications aimed at weight loss.  We will plan to further increase Wegovy to 2.4 mg weekly when appropriate.  Follow-up in 3 months for reassessment.

## 2023-09-18 NOTE — Progress Notes (Signed)
 Established Patient Office Visit  Subjective   Patient ID: [name], female    DOB: [birth year]  Age: 63 y.o. MRN: [id number]  Chief Complaint  Patient presents with   Care Management    Three month follow up    [name] returns to care today for weight management follow-up.  She was last evaluated by me through virtual encounter on 1/6 to discuss morbid obesity.  Reginal Lutes was started at that time and a 34-month follow-up was arranged..  There have been no acute interval events.  Today she reports feeling well.  Her current weight is 232 pounds, which is down 35 pounds from 267 at her appointment in January.  She is tolerating Wegovy well, currently prescribed 1.7 mg weekly.  She does not have any additional concerns to discuss.  Past Medical History:  Diagnosis Date   CAD (coronary artery disease), premature-cutting balloon athrectomy LAD 2004, 3 DES-Promus to Prox LCX 2012   04/02/2012   Coronary artery disease    Hyperlipidemia LDL goal < 70 04/02/2012   Hypertension    Metabolic syndrome 04/03/2012   Morbid obesity with BMI of 40.0-44.9, adult (HCC) 04/02/2012   Myocardial infarct (HCC)    Pneumonia    hx of in 03/2010   Shortness of breath    Unstable angina (HCC) 04/02/2012   Past Surgical History:  Procedure Laterality Date   ABDOMINAL HYSTERECTOMY     ANKLE SURGERY     CORONARY ANGIOPLASTY     LEFT HEART CATHETERIZATION WITH CORONARY ANGIOGRAM N/A 04/03/2012   Procedure: LEFT HEART CATHETERIZATION WITH CORONARY ANGIOGRAM;  Surgeon: Lennette Bihari, MD;  Location: [facility] CATH LAB;  Service: Cardiovascular;  Laterality: N/A;   LEG SURGERY Right    plates and screws in right lower leg   Social History   Tobacco Use   Smoking status: Former    Current packs/day: 0.00    Types: Cigarettes    Quit date: 06/12/1990    Years since quitting: 33.2   Smokeless tobacco: Never  Vaping Use   Vaping status: Never Used  Substance Use Topics   Alcohol use: Yes    Comment:  occasionally   Drug use: No   Family History  Problem Relation Age of Onset   Coronary artery disease Mother    Cancer - Ovarian Mother    Coronary artery disease Father    Heart attack Brother    Allergies  Allergen Reactions   Niacin Other (See Comments)   Niacin And Related    Penicillins Nausea And Vomiting    Pt reports the vomiting occurred when she was 63 y.o. and she has taken Penicillin since for dental procedure and tolerated it fine.    Statins    Review of Systems  Constitutional:  Negative for chills and fever.  HENT:  Negative for sore throat.   Respiratory:  Negative for cough and shortness of breath.   Cardiovascular:  Negative for chest pain, palpitations and leg swelling.  Gastrointestinal:  Negative for abdominal pain, blood in stool, constipation, diarrhea, nausea and vomiting.  Genitourinary:  Negative for dysuria and hematuria.  Musculoskeletal:  Negative for myalgias.  Skin:  Negative for itching and rash.  Neurological:  Negative for dizziness and headaches.  Psychiatric/Behavioral:  Negative for depression and suicidal ideas.      Objective:     BP 130/83 (BP Location: Left Arm, Patient Position: Sitting, Cuff Size: Large)   Pulse 81   Ht 5\' 7"  (1.702 m)  Wt 232 lb (105.2 kg)   SpO2 98%   BMI 36.34 kg/m  BP Readings from Last 3 Encounters:  09/18/23 130/83  04/16/23 (!) 141/86  03/14/23 (!) 148/84   Physical Exam Vitals reviewed.  Constitutional:      General: She is not in acute distress.    Appearance: Normal appearance. She is obese. She is not toxic-appearing.  HENT:     Head: Normocephalic and atraumatic.     Right Ear: External ear normal.     Left Ear: External ear normal.     Nose: Nose normal. No congestion or rhinorrhea.     Mouth/Throat:     Mouth: Mucous membranes are moist.     Pharynx: Oropharynx is clear. No oropharyngeal exudate or posterior oropharyngeal erythema.  Eyes:     General: No scleral icterus.     Extraocular Movements: Extraocular movements intact.     Conjunctiva/sclera: Conjunctivae normal.     Pupils: Pupils are equal, round, and reactive to light.  Cardiovascular:     Rate and Rhythm: Normal rate and regular rhythm.     Pulses: Normal pulses.     Heart sounds: Normal heart sounds. No murmur heard.    No friction rub. No gallop.  Pulmonary:     Effort: Pulmonary effort is normal.     Breath sounds: Normal breath sounds. No wheezing, rhonchi or rales.  Abdominal:     General: Abdomen is flat. Bowel sounds are normal. There is no distension.     Palpations: Abdomen is soft.     Tenderness: There is no abdominal tenderness.  Musculoskeletal:        General: No swelling. Normal range of motion.     Cervical back: Normal range of motion.     Right lower leg: No edema.     Left lower leg: No edema.  Lymphadenopathy:     Cervical: No cervical adenopathy.  Skin:    General: Skin is warm and dry.     Capillary Refill: Capillary refill takes less than 2 seconds.     Coloration: Skin is not jaundiced.  Neurological:     General: No focal deficit present.     Mental Status: She is alert and oriented to person, place, and time.  Psychiatric:        Mood and Affect: Mood normal.        Behavior: Behavior normal.   Last CBC Lab Results  Component Value Date   WBC 9.9 11/17/2022   HGB 13.5 11/17/2022   HCT 40.7 11/17/2022   MCV 82 11/17/2022   MCH 27.2 11/17/2022   RDW 13.7 11/17/2022   PLT 257 11/17/2022   Last metabolic panel Lab Results  Component Value Date   GLUCOSE 100 (H) 11/17/2022   NA 138 11/17/2022   K 4.8 11/17/2022   CL 102 11/17/2022   CO2 23 11/17/2022   BUN 11 11/17/2022   CREATININE 0.76 11/17/2022   EGFR 88 11/17/2022   CALCIUM 10.4 (H) 11/17/2022   PROT 7.4 11/17/2022   ALBUMIN 4.3 11/17/2022   LABGLOB 3.1 11/17/2022   AGRATIO 1.4 11/17/2022   BILITOT 0.3 11/17/2022   ALKPHOS 123 (H) 11/17/2022   AST 20 11/17/2022   ALT 16 11/17/2022    ANIONGAP 10 02/24/2022   Last lipids Lab Results  Component Value Date   CHOL 151 11/17/2022   HDL 48 11/17/2022   LDLCALC 77 11/17/2022   TRIG 151 (H) 11/17/2022   CHOLHDL 3.1 11/17/2022  Last hemoglobin A1c Lab Results  Component Value Date   HGBA1C 6.4 (H) 11/17/2022   Last thyroid functions Lab Results  Component Value Date   TSH 1.430 11/17/2022   Last vitamin D Lab Results  Component Value Date   VD25OH 34.5 11/17/2022   Last vitamin B12 and Folate Lab Results  Component Value Date   VITAMINB12 529 11/17/2022   FOLATE 18.1 11/17/2022     Assessment & Plan:   Problem List Items Addressed This Visit       Obesity (BMI 30-39.9) - Primary   Returning to care today for weight management follow-up.  Reginal Lutes was started at her last appointment and January.  Her weight at that time was 267 pounds.  She is down 35 pounds to 232 pounds today.  BMI 36.3.  She is tolerating Wegovy well and has not experienced any significant adverse side effects.  Currently prescribed 1.7 mg weekly.  Ms. Calbert was congratulated on her progress and encouraged to maintain lifestyle modifications aimed at weight loss.  We will plan to further increase Wegovy to 2.4 mg weekly when appropriate.  Follow-up in 3 months for reassessment.       Return in about 3 months (around 12/18/2023).    Billie Lade, MD

## 2023-10-02 ENCOUNTER — Other Ambulatory Visit (HOSPITAL_COMMUNITY): Payer: Self-pay

## 2023-10-02 ENCOUNTER — Encounter: Payer: Self-pay | Admitting: Internal Medicine

## 2023-10-02 DIAGNOSIS — E8881 Metabolic syndrome: Secondary | ICD-10-CM

## 2023-10-02 DIAGNOSIS — E669 Obesity, unspecified: Secondary | ICD-10-CM

## 2023-10-02 MED ORDER — WEGOVY 2.4 MG/0.75ML ~~LOC~~ SOAJ
2.4000 mg | SUBCUTANEOUS | 2 refills | Status: DC
Start: 2023-10-02 — End: 2023-12-24
  Filled 2023-10-02: qty 3, 28d supply, fill #0
  Filled 2023-10-26: qty 3, 28d supply, fill #1
  Filled 2023-11-23: qty 3, 28d supply, fill #2

## 2023-10-03 ENCOUNTER — Other Ambulatory Visit (HOSPITAL_COMMUNITY): Payer: Self-pay

## 2023-11-26 ENCOUNTER — Other Ambulatory Visit (HOSPITAL_COMMUNITY): Payer: Self-pay

## 2023-11-26 MED ORDER — METOPROLOL SUCCINATE ER 25 MG PO TB24
25.0000 mg | ORAL_TABLET | Freq: Every day | ORAL | 3 refills | Status: AC
  Filled 2023-11-26: qty 90, 90d supply, fill #0
  Filled 2024-02-21: qty 90, 90d supply, fill #1
  Filled 2024-06-09: qty 90, 90d supply, fill #2

## 2023-11-26 MED ORDER — ATORVASTATIN CALCIUM 40 MG PO TABS
40.0000 mg | ORAL_TABLET | Freq: Every day | ORAL | 3 refills | Status: DC
  Filled 2023-11-26: qty 90, 90d supply, fill #0
  Filled 2024-02-21: qty 90, 90d supply, fill #1

## 2023-11-27 ENCOUNTER — Other Ambulatory Visit (HOSPITAL_COMMUNITY): Payer: Self-pay

## 2023-11-27 ENCOUNTER — Other Ambulatory Visit: Payer: Self-pay | Admitting: Internal Medicine

## 2023-11-27 DIAGNOSIS — F329 Major depressive disorder, single episode, unspecified: Secondary | ICD-10-CM

## 2023-11-27 DIAGNOSIS — G8929 Other chronic pain: Secondary | ICD-10-CM

## 2023-11-27 MED ORDER — DULOXETINE HCL 60 MG PO CPEP
60.0000 mg | ORAL_CAPSULE | Freq: Every day | ORAL | 1 refills | Status: DC
  Filled 2023-11-27: qty 90, 90d supply, fill #0
  Filled 2024-02-21: qty 90, 90d supply, fill #1

## 2023-12-18 ENCOUNTER — Ambulatory Visit

## 2023-12-18 VITALS — BP 105/74 | HR 114 | Ht 66.0 in | Wt 202.1 lb

## 2023-12-18 DIAGNOSIS — E669 Obesity, unspecified: Secondary | ICD-10-CM | POA: Diagnosis not present

## 2023-12-18 DIAGNOSIS — Z6832 Body mass index (BMI) 32.0-32.9, adult: Secondary | ICD-10-CM | POA: Diagnosis not present

## 2023-12-18 NOTE — Progress Notes (Signed)
 Established Patient Office Visit  Subjective   Patient ID: [name], female    DOB: [birth year]  Age: 63 y.o. MRN: [id number]  Chief Complaint  Patient presents with   Medical Management of Chronic Issues    84-month follow up    HPI  Patient Active Problem List   Diagnosis Date Noted   Closed avulsion fracture of navicular bone of foot, left, sequela 04/16/2023   Colon cancer screening 04/16/2023   MDD (major depressive disorder) 08/11/2022   Chronic musculoskeletal pain 08/11/2022   Restless leg syndrome 08/11/2022   Carpal tunnel syndrome 08/11/2022   Positive screening for depression on 9-item Patient Health Questionnaire (PHQ-9) 06/13/2022   H/O: hysterectomy 05/08/2022   Encounter for general adult medical examination with abnormal findings 05/08/2022   Chest pain 08/16/2017   History of MI (myocardial infarction)    Class 3 severe obesity due to excess calories with body mass index (BMI) of 40.0 to 44.9 in adult    Metabolic syndrome 04/03/2012   HTN (hypertension) 04/03/2012   Family history of coronary artery disease 04/03/2012   Unstable angina (HCC) 04/02/2012   CAD (coronary artery disease), premature;  cutting balloon athrectomy LAD 2004, 3 DES-Promus to Prox LCX 2012   04/02/2012   Obesity (BMI 30-39.9) 04/02/2012   Hyperlipidemia 04/02/2012   Mechanical complication of internal orthopedic device, implant, and graft (HCC) 04/18/2007      ROS    Objective:     BP 105/74   Pulse (!) 114   Ht 5' 6 (1.676 m)   Wt 202 lb 1.3 oz (91.7 kg)   SpO2 96%   BMI 32.62 kg/m  BP Readings from Last 3 Encounters:  12/18/23 105/74  09/18/23 130/83  04/16/23 (!) 141/86   Wt Readings from Last 3 Encounters:  12/18/23 202 lb 1.3 oz (91.7 kg)  09/18/23 232 lb (105.2 kg)  04/16/23 267 lb 0.6 oz (121.1 kg)     Physical Exam Vitals and nursing note reviewed.  Constitutional:      Appearance: Normal appearance.  HENT:     Head: Normocephalic.  Eyes:      Extraocular Movements: Extraocular movements intact.     Pupils: Pupils are equal, round, and reactive to light.  Cardiovascular:     Rate and Rhythm: Normal rate and regular rhythm.  Pulmonary:     Effort: Pulmonary effort is normal.     Breath sounds: Normal breath sounds.  Musculoskeletal:     Cervical back: Normal range of motion and neck supple.  Neurological:     Mental Status: She is alert and oriented to person, place, and time.  Psychiatric:        Mood and Affect: Mood normal.        Thought Content: Thought content normal.      No results found for any visits on 12/18/23.  Last CBC Lab Results  Component Value Date   WBC 9.9 11/17/2022   HGB 13.5 11/17/2022   HCT 40.7 11/17/2022   MCV 82 11/17/2022   MCH 27.2 11/17/2022   RDW 13.7 11/17/2022   PLT 257 11/17/2022   Last metabolic panel Lab Results  Component Value Date   GLUCOSE 100 (H) 11/17/2022   NA 138 11/17/2022   K 4.8 11/17/2022   CL 102 11/17/2022   CO2 23 11/17/2022   BUN 11 11/17/2022   CREATININE 0.76 11/17/2022   EGFR 88 11/17/2022   CALCIUM  10.4 (H) 11/17/2022   PROT 7.4 11/17/2022  ALBUMIN 4.3 11/17/2022   LABGLOB 3.1 11/17/2022   AGRATIO 1.4 11/17/2022   BILITOT 0.3 11/17/2022   ALKPHOS 123 (H) 11/17/2022   AST 20 11/17/2022   ALT 16 11/17/2022   ANIONGAP 10 02/24/2022   Last lipids Lab Results  Component Value Date   CHOL 151 11/17/2022   HDL 48 11/17/2022   LDLCALC 77 11/17/2022   TRIG 151 (H) 11/17/2022   CHOLHDL 3.1 11/17/2022   Last hemoglobin A1c Lab Results  Component Value Date   HGBA1C 6.4 (H) 11/17/2022   Last vitamin D  Lab Results  Component Value Date   VD25OH 34.5 11/17/2022      The ASCVD Risk score (Arnett DK, et al., 2019) failed to calculate for the following reasons:   Risk score cannot be calculated because patient has a medical history suggesting prior/existing ASCVD    Assessment & Plan:   Problem List Items Addressed This Visit        Other   Obesity (BMI 30-39.9) - Primary   Returning to care today for weight management follow-up.  Wegovy  was started at her appointment in January.  Her weight at that time was 267 pounds.  She is down 65 pounds to 202 pounds today.  BMI 32.62.  She is tolerating Wegovy  well and has not experienced any significant adverse side effects.  Currently prescribed 2.4 mg weekly.  Ms. Mcgruder was congratulated on her progress and encouraged to maintain lifestyle modifications aimed at weight loss.  Follow-up in 3 months for reassessment.       Return in about 3 months (around 03/19/2024).    Leita Longs, FNP

## 2023-12-18 NOTE — Assessment & Plan Note (Signed)
 Returning to care today for weight management follow-up.  Wegovy  was started at her appointment in January.  Her weight at that time was 267 pounds.  She is down 65 pounds to 202 pounds today.  BMI 32.62.  She is tolerating Wegovy  well and has not experienced any significant adverse side effects.  Currently prescribed 2.4 mg weekly.  [name] was congratulated on her progress and encouraged to maintain lifestyle modifications aimed at weight loss.  Follow-up in 3 months for reassessment.

## 2023-12-24 ENCOUNTER — Other Ambulatory Visit (HOSPITAL_COMMUNITY): Payer: Self-pay

## 2023-12-24 ENCOUNTER — Other Ambulatory Visit: Payer: Self-pay | Admitting: Internal Medicine

## 2023-12-24 DIAGNOSIS — E669 Obesity, unspecified: Secondary | ICD-10-CM

## 2023-12-24 DIAGNOSIS — E8881 Metabolic syndrome: Secondary | ICD-10-CM

## 2023-12-24 MED ORDER — WEGOVY 2.4 MG/0.75ML ~~LOC~~ SOAJ
2.4000 mg | SUBCUTANEOUS | 2 refills | Status: DC
Start: 2023-12-24 — End: 2024-03-26
  Filled 2023-12-24: qty 3, 28d supply, fill #0
  Filled 2024-01-22 – 2024-01-23 (×2): qty 3, 28d supply, fill #1
  Filled 2024-02-21: qty 3, 28d supply, fill #2

## 2024-01-22 ENCOUNTER — Other Ambulatory Visit (HOSPITAL_COMMUNITY): Payer: Self-pay

## 2024-01-23 ENCOUNTER — Telehealth: Payer: Self-pay | Admitting: Pharmacy Technician

## 2024-01-23 ENCOUNTER — Other Ambulatory Visit (HOSPITAL_COMMUNITY): Payer: Self-pay

## 2024-01-23 NOTE — Telephone Encounter (Signed)
 Pharmacy Patient Advocate Encounter   Received notification from CoverMyMeds that prior authorization for Wegovy  2.4MG /0.75ML auto-injectors is required/requested.   Insurance verification completed.   The patient is insured through [facility] .   Per test claim: PA required; PA submitted to above mentioned insurance via Latent Key/confirmation #/EOC [facility] Status is pending

## 2024-01-23 NOTE — Telephone Encounter (Signed)
 Pharmacy Patient Advocate Encounter  Received notification from [facility] that Prior Authorization for Wegovy  2.4MG /0.75ML auto-injectors  has been APPROVED from 01/23/2024 to 01/22/2025. Ran test claim, Copay is $24.99. This test claim was processed through [facility]- copay amounts may vary at other pharmacies due to pharmacy/plan contracts, or as the patient moves through the different stages of their insurance plan.   PA #/Case ID/Reference #: [id number]

## 2024-02-01 ENCOUNTER — Encounter: Payer: Self-pay | Admitting: Radiology

## 2024-02-25 ENCOUNTER — Other Ambulatory Visit (HOSPITAL_COMMUNITY): Payer: Self-pay

## 2024-03-20 ENCOUNTER — Ambulatory Visit

## 2024-03-20 VITALS — BP 146/87 | HR 76 | Ht 66.0 in | Wt 183.1 lb

## 2024-03-20 DIAGNOSIS — E669 Obesity, unspecified: Secondary | ICD-10-CM | POA: Diagnosis not present

## 2024-03-20 NOTE — Progress Notes (Signed)
 Established Patient Office Visit  Subjective   Patient ID: [name], female    DOB: 28-Nov-1960  Age: 63 y.o. MRN: [id number]  Chief Complaint  Patient presents with   Medical Management of Chronic Issues    3  month follow up    HPI Overweight/Obesity Complicated by nothing:  Goal on Track (progressing): YES.  Unable to achieve goal weight loss through lifestyle modification alone; current treatment:Wegovy  and lifestyle changes;   Medications/Strategies previously tried: Wegovy   Baseline weight: 240 lbs; most recent weight: 183 lbs  Current exercise: gym 2- 3 days a week  Extensive dietary counseling including education on focus on lean proteins, fruits and vegetables, whole grains and increased fiber consumption, adequate hydration  Extensive exercise counseling including eventual goal of 150 minutes of moderate intensity exercise weekly; role of resistance training; breaking up prolonged sedentary time  Collaborated with continuation of Wegovy .   Discussed the use of AI scribe software for clinical note transcription with the patient, who gave verbal consent to proceed.  History of Present Illness   [name] is a 63 year old female who presents for follow-up on weight loss.  Weight loss and obesity management - Lost 20 pounds since July - Current weight loss goal is an additional 15-20 pounds to reach target weight of 150-160 pounds, which was her weight for much of her adult life prior to weight gain - Currently taking Wegovy  2.4 mg for weight management  Physical activity and exercise tolerance - Resumed exercise at [employer] to maintain muscle mass and improve physical fitness - Improved energy levels and decreased fatigue since restarting exercise  Psychosocial stressors - Experiencing stress related to her mother's driving and health issues - Mother has fallen three times, with two falls in the past six months and a history of hip fracture - Monitors  mother's stability via cameras and is negotiating with her mother to relinquish car keys in exchange for reducing the need for a sitter      Patient Active Problem List   Diagnosis Date Noted   Closed avulsion fracture of navicular bone of foot, left, sequela 04/16/2023   Colon cancer screening 04/16/2023   MDD (major depressive disorder) 08/11/2022   Chronic musculoskeletal pain 08/11/2022   Restless leg syndrome 08/11/2022   Carpal tunnel syndrome 08/11/2022   Positive screening for depression on 9-item Patient Health Questionnaire (PHQ-9) 06/13/2022   H/O: hysterectomy 05/08/2022   Encounter for general adult medical examination with abnormal findings 05/08/2022   Chest pain 08/16/2017   History of MI (myocardial infarction)    Class 3 severe obesity due to excess calories with body mass index (BMI) of 40.0 to 44.9 in adult [facility])    Metabolic syndrome 04/03/2012   HTN (hypertension) 04/03/2012   Family history of coronary artery disease 04/03/2012   Unstable angina (HCC) 04/02/2012   CAD (coronary artery disease), premature;  cutting balloon athrectomy LAD 2004, 3 DES-Promus to Prox LCX 2012   04/02/2012   Obesity (BMI 30-39.9) 04/02/2012   Hyperlipidemia 04/02/2012   Mechanical complication of internal orthopedic device, implant, and graft 04/18/2007      ROS    Objective:     BP (!) 146/87   Pulse 76   Ht 5' 6 (1.676 m)   Wt 183 lb 1.3 oz (83 kg)   SpO2 96%   BMI 29.55 kg/m  BP Readings from Last 3 Encounters:  03/20/24 (!) 146/87  12/18/23 105/74  09/18/23 130/83  Wt Readings from Last 3 Encounters:  03/20/24 183 lb 1.3 oz (83 kg)  12/18/23 202 lb 1.3 oz (91.7 kg)  09/18/23 232 lb (105.2 kg)     Physical Exam Vitals and nursing note reviewed.  Constitutional:      Appearance: Normal appearance.  HENT:     Head: Normocephalic.  Eyes:     Extraocular Movements: Extraocular movements intact.     Pupils: Pupils are equal, round, and reactive to light.   Cardiovascular:     Rate and Rhythm: Normal rate and regular rhythm.  Pulmonary:     Effort: Pulmonary effort is normal.     Breath sounds: Normal breath sounds.  Musculoskeletal:     Cervical back: Normal range of motion and neck supple.  Neurological:     Mental Status: She is alert and oriented to person, place, and time.  Psychiatric:        Mood and Affect: Mood normal.        Thought Content: Thought content normal.      No results found for any visits on 03/20/24.  Last CBC Lab Results  Component Value Date   WBC 9.9 11/17/2022   HGB 13.5 11/17/2022   HCT 40.7 11/17/2022   MCV 82 11/17/2022   MCH 27.2 11/17/2022   RDW 13.7 11/17/2022   PLT 257 11/17/2022   Last metabolic panel Lab Results  Component Value Date   GLUCOSE 100 (H) 11/17/2022   NA 138 11/17/2022   K 4.8 11/17/2022   CL 102 11/17/2022   CO2 23 11/17/2022   BUN 11 11/17/2022   CREATININE 0.76 11/17/2022   EGFR 88 11/17/2022   CALCIUM  10.4 (H) 11/17/2022   PROT 7.4 11/17/2022   ALBUMIN 4.3 11/17/2022   LABGLOB 3.1 11/17/2022   AGRATIO 1.4 11/17/2022   BILITOT 0.3 11/17/2022   ALKPHOS 123 (H) 11/17/2022   AST 20 11/17/2022   ALT 16 11/17/2022   ANIONGAP 10 02/24/2022   Last lipids Lab Results  Component Value Date   CHOL 151 11/17/2022   HDL 48 11/17/2022   LDLCALC 77 11/17/2022   TRIG 151 (H) 11/17/2022   CHOLHDL 3.1 11/17/2022   Last hemoglobin A1c Lab Results  Component Value Date   HGBA1C 6.4 (H) 11/17/2022   Last thyroid  functions Lab Results  Component Value Date   TSH 1.430 11/17/2022   Last vitamin D  Lab Results  Component Value Date   VD25OH 34.5 11/17/2022   Last vitamin B12 and Folate Lab Results  Component Value Date   VITAMINB12 529 11/17/2022   FOLATE 18.1 11/17/2022      The ASCVD Risk score (Arnett DK, et al., 2019) failed to calculate for the following reasons:   Risk score cannot be calculated because patient has a medical history suggesting  prior/existing ASCVD    Assessment & Plan:   Problem List Items Addressed This Visit       Other   Obesity (BMI 30-39.9) - Primary   She lost 20 pounds since July, aims to lose 15-20 more pounds. Currently on Wegovy  2.4 mg, plans to continue before maintenance dose. Engages in exercise to aid weight loss and prevent muscle loss. - Continue Wegovy  2.4 mg. - Re-evaluate for maintenance dose in 3 months. - Encourage continued exercise at [employer].      Return in about 3 months (around 06/20/2024) for chronic follow-up with PCP.    Leita Longs, FNP

## 2024-03-24 NOTE — Assessment & Plan Note (Signed)
 She lost 20 pounds since July, aims to lose 15-20 more pounds. Currently on Wegovy  2.4 mg, plans to continue before maintenance dose. Engages in exercise to aid weight loss and prevent muscle loss. - Continue Wegovy  2.4 mg. - Re-evaluate for maintenance dose in 3 months. - Encourage continued exercise at [employer].

## 2024-03-26 ENCOUNTER — Other Ambulatory Visit: Payer: Self-pay

## 2024-03-26 DIAGNOSIS — E8881 Metabolic syndrome: Secondary | ICD-10-CM

## 2024-03-26 DIAGNOSIS — E669 Obesity, unspecified: Secondary | ICD-10-CM

## 2024-03-27 ENCOUNTER — Other Ambulatory Visit (HOSPITAL_COMMUNITY): Payer: Self-pay

## 2024-03-27 MED ORDER — WEGOVY 2.4 MG/0.75ML ~~LOC~~ SOAJ
2.4000 mg | SUBCUTANEOUS | 2 refills | Status: AC
  Filled 2024-03-27: qty 3, 28d supply, fill #0
  Filled 2024-05-12: qty 3, 28d supply, fill #1
  Filled 2024-06-09: qty 3, 28d supply, fill #2

## 2024-04-14 ENCOUNTER — Encounter: Payer: Self-pay | Admitting: Radiology

## 2024-05-29 ENCOUNTER — Encounter: Payer: Self-pay | Admitting: Internal Medicine

## 2024-05-29 ENCOUNTER — Ambulatory Visit: Attending: Internal Medicine | Admitting: Internal Medicine

## 2024-05-29 ENCOUNTER — Other Ambulatory Visit (HOSPITAL_COMMUNITY): Payer: Self-pay

## 2024-05-29 VITALS — BP 108/72 | HR 83 | Ht 67.0 in | Wt 174.0 lb

## 2024-05-29 DIAGNOSIS — Z7902 Long term (current) use of antithrombotics/antiplatelets: Secondary | ICD-10-CM | POA: Insufficient documentation

## 2024-05-29 DIAGNOSIS — I2510 Atherosclerotic heart disease of native coronary artery without angina pectoris: Secondary | ICD-10-CM | POA: Diagnosis not present

## 2024-05-29 MED ORDER — NITROGLYCERIN 0.4 MG SL SUBL
0.4000 mg | SUBLINGUAL_TABLET | SUBLINGUAL | 3 refills | Status: AC | PRN
  Filled 2024-05-29: qty 25, 10d supply, fill #0

## 2024-05-29 NOTE — Patient Instructions (Addendum)
 Medication Instructions:  Your physician recommends that you continue on your current medications as directed. Please refer to the Current Medication list given to you today.   Labwork: None  Testing/Procedures: None  Follow-Up: Your physician recommends that you schedule a follow-up appointment in: 1.5 years. You will receive a reminder call in about 10 months reminding you to schedule your appointment. If you don't receive this call, please contact our office.   Any Other Special Instructions Will Be Listed Below (If Applicable). Thank you for choosing [facility]!     If you need a refill on your cardiac medications before your next appointment, please call your pharmacy.

## 2024-05-29 NOTE — Progress Notes (Addendum)
 Cardiology Office Note  Date: 05/29/2024   ID: Susan, Carson 17-Jan-1961, MRN [id number]  PCP:  Bevely Doffing, FNP  Cardiologist:  Diannah SHAUNNA Maywood, MD Electrophysiologist:  None    History of Present Illness: [name] is a 63 y.o. female known to have CAD s/p Cutting Balloon atherectomy of mid LAD in 2004, LCx PCI in 2012 with normal LVEF, HLD, HTN is here for follow-up visit.  On Wegovy . Lost around 100 pounds of weight.  BMI 27 now.  Previously it was around 40s.  Does not have any angina or DOE.  No dizziness, palpitations, syncope, leg swelling.  Past Medical History:  Diagnosis Date   CAD (coronary artery disease), premature-cutting balloon athrectomy LAD 2004, 3 DES-Promus to Prox LCX 2012   04/02/2012   Coronary artery disease    Hyperlipidemia LDL goal < 70 04/02/2012   Hypertension    Metabolic syndrome 04/03/2012   Morbid obesity with BMI of 40.0-44.9, adult (HCC) 04/02/2012   Myocardial infarct (HCC)    Pneumonia    hx of in 03/2010   Shortness of breath    Unstable angina (HCC) 04/02/2012    Past Surgical History:  Procedure Laterality Date   ABDOMINAL HYSTERECTOMY     ANKLE SURGERY     CORONARY ANGIOPLASTY     LEFT HEART CATHETERIZATION WITH CORONARY ANGIOGRAM N/A 04/03/2012   Procedure: LEFT HEART CATHETERIZATION WITH CORONARY ANGIOGRAM;  Surgeon: Debby DELENA Sor, MD;  Location: [facility] CATH LAB;  Service: Cardiovascular;  Laterality: N/A;   LEG SURGERY Right    plates and screws in right lower leg    Current Outpatient Medications  Medication Sig Dispense Refill   aspirin  81 MG tablet Take 1 tablet (81 mg total) by mouth daily. 30 tablet 0   atorvastatin  (LIPITOR ) 40 MG tablet Take 40 mg by mouth daily.     DULoxetine  (CYMBALTA ) 60 MG capsule Take 1 capsule (60 mg total) by mouth daily. 90 capsule 1   metoprolol  succinate (TOPROL -XL) 25 MG 24 hr tablet Take 1 tablet (25 mg total) by mouth daily. 90 tablet 3   Multiple Vitamins-Minerals  (MULTIVITAMIN WITH MINERALS) tablet Take 1 tablet by mouth daily.     rOPINIRole  (REQUIP ) 0.25 MG tablet TAKE ONE TABLET (0.25MG  TOTAL) BY MOUTH AT BEDTIME 30 tablet 0   semaglutide -weight management (WEGOVY ) 2.4 MG/0.75ML SOAJ SQ injection Inject 2.4 mg into the skin once a week. 3 mL 2   acetaminophen  (TYLENOL ) 650 MG CR tablet Take 650 mg by mouth every 8 (eight) hours as needed for pain or fever.     nitroGLYCERIN  (NITROSTAT ) 0.4 MG SL tablet Place 1 tablet under the tongue every 5 (five) minutes as needed for chest pain for up to 2 doses. If pain persists call 911. 25 tablet 3   No current facility-administered medications for this visit.   Allergies:  Niacin, Niacin and related, Penicillins, and Statins   Social History: The patient  reports that she quit smoking about 33 years ago. Her smoking use included cigarettes. She has never used smokeless tobacco. She reports current alcohol use. She reports that she does not use drugs.   Family History: The patient's family history includes Cancer - Ovarian in her mother; Coronary artery disease in her father and mother; Heart attack in her brother.   ROS:  Please see the history of present illness. Otherwise, complete review of systems is positive for none.  All other systems are reviewed and negative.   Physical  Exam: VS:  BP 108/72 (BP Location: Right Arm, Cuff Size: Normal)   Pulse 83   Ht 5' 7 (1.702 m)   Wt 174 lb (78.9 kg)   SpO2 97%   BMI 27.25 kg/m , BMI Body mass index is 27.25 kg/m.  Wt Readings from Last 3 Encounters:  05/29/24 174 lb (78.9 kg)  03/20/24 183 lb 1.3 oz (83 kg)  12/18/23 202 lb 1.3 oz (91.7 kg)    General: Patient appears comfortable at rest. HEENT: Conjunctiva and lids normal, oropharynx clear with moist mucosa. Neck: Supple, no elevated JVP or carotid bruits, no thyromegaly. Lungs: Clear to auscultation, nonlabored breathing at rest. Cardiac: Regular rate and rhythm, no S3 or significant systolic  murmur, no pericardial rub. Abdomen: Soft, nontender, no hepatomegaly, bowel sounds present, no guarding or rebound. Extremities: No pitting edema, distal pulses 2+. Skin: Warm and dry. Musculoskeletal: No kyphosis. Neuropsychiatric: Alert and oriented x3, affect grossly appropriate.  ECG:  An ECG dated 07/21/2022 was personally reviewed today and demonstrated:  Normal sinus rhythm and no ST-T changes.  Recent Labwork: No results found for requested labs within last 365 days.     Component Value Date/Time   CHOL 151 11/17/2022 1111   TRIG 151 (H) 11/17/2022 1111   HDL 48 11/17/2022 1111   CHOLHDL 3.1 11/17/2022 1111   CHOLHDL 5.5 08/17/2017 0220   VLDL 30 08/17/2017 0220   LDLCALC 77 11/17/2022 1111    Other Studies Reviewed Today: Echocardiogram from 08/2017 LVEF 60-65% Trivial MR  NM stress test in 08/2017 IMPRESSION: 1. No reversible ischemia or infarction. 2. Normal left ventricular wall motion. 3. Left ventricular ejection fraction 58% 4. Non invasive risk stratification*: Low  Assessment and Plan:  CAD s/p mid LAD PCI in 2004, LCx PCI in 2012 with normal LVEF, angina free: No interval angina, continue aspirin  81 mg once daily, atorvastatin  40 mg nightly.  Refill SL NTG. Lost around 100 pounds of weight after starting Wegovy .  ER precautions for chest pain.  Echo from 2019 showed normal LVEF, no valvular heart disease.   HLD, at goal: Continue atorvastatin  40 mg nightly.  Goal LDL less than 70.  Lipid panel from June 2024 was reviewed, TG 151 (mildly elevated), LDL 77.  Palpitations, resolved: Continue metoprolol  succinate 25 mg once daily.  Morbid obesity with BMI 42.76: Resolved.  BMI today is 27.25. Lost around 100 pounds of weight after starting Wegovy .  30 minutes spent in reviewing prior medical records, more than 3 labs, discussion and documentation.  Medication Adjustments/Labs and Tests Ordered: Current medicines are reviewed at length with the patient today.   Concerns regarding medicines are outlined above.   Tests Ordered: Orders Placed This Encounter  Procedures   EKG 12-Lead    Medication Changes: Meds ordered this encounter  Medications   nitroGLYCERIN  (NITROSTAT ) 0.4 MG SL tablet    Sig: Place 1 tablet under the tongue every 5 (five) minutes as needed for chest pain for up to 2 doses. If pain persists call 911.    Dispense:  25 tablet    Refill:  3    Disposition:  Follow up 1.5 years  Signed, Cassady Turano Arleta Maywood, MD, 05/29/2024 10:30 AM    [facility] [address] [place], [place] [address]

## 2024-06-09 ENCOUNTER — Other Ambulatory Visit: Payer: Self-pay

## 2024-06-09 DIAGNOSIS — M7918 Myalgia, other site: Secondary | ICD-10-CM

## 2024-06-09 DIAGNOSIS — F329 Major depressive disorder, single episode, unspecified: Secondary | ICD-10-CM

## 2024-06-10 ENCOUNTER — Other Ambulatory Visit: Payer: Self-pay

## 2024-06-10 ENCOUNTER — Other Ambulatory Visit (HOSPITAL_COMMUNITY): Payer: Self-pay

## 2024-06-10 MED ORDER — DULOXETINE HCL 60 MG PO CPEP
60.0000 mg | ORAL_CAPSULE | Freq: Every day | ORAL | 1 refills | Status: AC
  Filled 2024-06-10: qty 90, 90d supply, fill #0

## 2024-07-11 ENCOUNTER — Other Ambulatory Visit (HOSPITAL_COMMUNITY): Payer: Self-pay

## 2024-07-11 ENCOUNTER — Other Ambulatory Visit: Payer: Self-pay | Admitting: Internal Medicine

## 2024-07-11 ENCOUNTER — Ambulatory Visit

## 2024-07-11 ENCOUNTER — Other Ambulatory Visit: Payer: Self-pay

## 2024-07-11 MED ORDER — ATORVASTATIN CALCIUM 40 MG PO TABS
40.0000 mg | ORAL_TABLET | Freq: Every day | ORAL | 3 refills | Status: AC
  Filled 2024-07-11: qty 90, 90d supply, fill #0

## 2024-08-08 ENCOUNTER — Ambulatory Visit
